# Patient Record
Sex: Female | Born: 1940
Health system: Southern US, Community
[De-identification: ages and names within clinical notes are randomized; demographics above are authoritative.]

## PROBLEM LIST (undated history)

## (undated) DIAGNOSIS — K219 Gastro-esophageal reflux disease without esophagitis: Secondary | ICD-10-CM

## (undated) DIAGNOSIS — R918 Other nonspecific abnormal finding of lung field: Secondary | ICD-10-CM

## (undated) DIAGNOSIS — J449 Chronic obstructive pulmonary disease, unspecified: Secondary | ICD-10-CM

## (undated) DIAGNOSIS — E785 Hyperlipidemia, unspecified: Secondary | ICD-10-CM

## (undated) DIAGNOSIS — F32A Depression, unspecified: Secondary | ICD-10-CM

## (undated) DIAGNOSIS — F419 Anxiety disorder, unspecified: Secondary | ICD-10-CM

## (undated) DIAGNOSIS — S7291XA Unspecified fracture of right femur, initial encounter for closed fracture: Secondary | ICD-10-CM

## (undated) HISTORY — DX: Anxiety disorder, unspecified: F41.9

## (undated) HISTORY — DX: Chronic obstructive pulmonary disease, unspecified: J44.9

## (undated) HISTORY — PX: COLONOSCOPY: SHX174

---

## 1973-12-18 HISTORY — PX: ABDOMINAL HYSTERECTOMY: SHX81

## 2006-09-20 ENCOUNTER — Ambulatory Visit: Payer: Self-pay | Admitting: Family Medicine

## 2007-01-15 ENCOUNTER — Ambulatory Visit: Payer: Self-pay

## 2007-04-23 ENCOUNTER — Ambulatory Visit: Payer: Self-pay | Admitting: Anesthesiology

## 2007-05-02 ENCOUNTER — Ambulatory Visit: Payer: Self-pay | Admitting: Anesthesiology

## 2007-05-22 ENCOUNTER — Ambulatory Visit: Payer: Self-pay | Admitting: Anesthesiology

## 2007-06-20 ENCOUNTER — Ambulatory Visit: Payer: Self-pay | Admitting: Anesthesiology

## 2007-07-24 ENCOUNTER — Ambulatory Visit: Payer: Self-pay | Admitting: Anesthesiology

## 2007-08-27 ENCOUNTER — Ambulatory Visit: Payer: Self-pay | Admitting: Anesthesiology

## 2007-09-24 ENCOUNTER — Ambulatory Visit: Payer: Self-pay | Admitting: Family Medicine

## 2007-09-27 ENCOUNTER — Ambulatory Visit: Payer: Self-pay | Admitting: Anesthesiology

## 2007-11-21 ENCOUNTER — Ambulatory Visit: Payer: Self-pay | Admitting: Orthopaedic Surgery

## 2008-01-02 ENCOUNTER — Ambulatory Visit: Payer: Self-pay | Admitting: Gastroenterology

## 2008-01-13 ENCOUNTER — Ambulatory Visit: Payer: Self-pay | Admitting: Pain Medicine

## 2008-01-22 ENCOUNTER — Ambulatory Visit: Payer: Self-pay | Admitting: Pain Medicine

## 2008-02-03 ENCOUNTER — Ambulatory Visit: Payer: Self-pay | Admitting: Pain Medicine

## 2008-02-13 ENCOUNTER — Ambulatory Visit: Payer: Self-pay | Admitting: Pain Medicine

## 2008-03-02 ENCOUNTER — Ambulatory Visit: Payer: Self-pay | Admitting: Pain Medicine

## 2008-03-18 ENCOUNTER — Other Ambulatory Visit: Payer: Self-pay

## 2008-03-18 ENCOUNTER — Ambulatory Visit: Payer: Self-pay | Admitting: Unknown Physician Specialty

## 2008-03-23 ENCOUNTER — Ambulatory Visit: Payer: Self-pay | Admitting: Unknown Physician Specialty

## 2008-04-07 ENCOUNTER — Encounter: Payer: Self-pay | Admitting: Unknown Physician Specialty

## 2008-04-17 ENCOUNTER — Encounter: Payer: Self-pay | Admitting: Unknown Physician Specialty

## 2008-05-18 ENCOUNTER — Encounter: Payer: Self-pay | Admitting: Unknown Physician Specialty

## 2008-07-18 ENCOUNTER — Ambulatory Visit: Payer: Self-pay | Admitting: Family Medicine

## 2008-07-20 ENCOUNTER — Ambulatory Visit: Payer: Self-pay | Admitting: Internal Medicine

## 2008-09-04 ENCOUNTER — Ambulatory Visit: Payer: Self-pay | Admitting: Unknown Physician Specialty

## 2008-12-08 ENCOUNTER — Ambulatory Visit: Payer: Self-pay | Admitting: Family Medicine

## 2008-12-12 ENCOUNTER — Ambulatory Visit: Payer: Self-pay | Admitting: Family Medicine

## 2008-12-17 ENCOUNTER — Ambulatory Visit: Payer: Self-pay | Admitting: Family Medicine

## 2008-12-18 ENCOUNTER — Ambulatory Visit: Payer: Self-pay | Admitting: Oncology

## 2008-12-21 ENCOUNTER — Ambulatory Visit: Payer: Self-pay | Admitting: Oncology

## 2009-01-18 ENCOUNTER — Ambulatory Visit: Payer: Self-pay | Admitting: Oncology

## 2010-04-14 ENCOUNTER — Ambulatory Visit: Payer: Self-pay | Admitting: Family Medicine

## 2010-12-18 HISTORY — PX: COLOSTOMY: SHX63

## 2010-12-18 HISTORY — PX: OTHER SURGICAL HISTORY: SHX169

## 2011-03-28 ENCOUNTER — Ambulatory Visit: Payer: Self-pay | Admitting: Family Medicine

## 2011-06-18 ENCOUNTER — Ambulatory Visit: Payer: Self-pay | Admitting: Internal Medicine

## 2011-06-28 ENCOUNTER — Ambulatory Visit: Payer: Self-pay | Admitting: Family Medicine

## 2011-06-30 ENCOUNTER — Inpatient Hospital Stay: Payer: Self-pay | Admitting: Surgery

## 2011-07-19 ENCOUNTER — Ambulatory Visit: Payer: Self-pay | Admitting: Internal Medicine

## 2011-08-13 ENCOUNTER — Emergency Department: Payer: Self-pay | Admitting: Emergency Medicine

## 2011-08-19 ENCOUNTER — Ambulatory Visit: Payer: Self-pay | Admitting: Internal Medicine

## 2011-08-30 ENCOUNTER — Ambulatory Visit: Payer: Self-pay | Admitting: Surgery

## 2011-11-27 ENCOUNTER — Ambulatory Visit: Payer: Self-pay | Admitting: Surgery

## 2011-12-19 HISTORY — PX: TAKE DOWN OF INTESTINAL FISTULA: SHX6107

## 2011-12-19 HISTORY — PX: TUMOR EXCISION: SHX421

## 2011-12-29 ENCOUNTER — Ambulatory Visit: Payer: Self-pay | Admitting: Surgery

## 2011-12-29 LAB — CBC WITH DIFFERENTIAL/PLATELET
Basophil #: 0 10*3/uL (ref 0.0–0.1)
Basophil %: 0.4 %
Eosinophil %: 1.7 %
HCT: 34 % — ABNORMAL LOW (ref 35.0–47.0)
HGB: 11.4 g/dL — ABNORMAL LOW (ref 12.0–16.0)
Lymphocyte #: 1.9 10*3/uL (ref 1.0–3.6)
MCV: 95 fL (ref 80–100)
Monocyte %: 5.3 %
Neutrophil %: 72.3 %
Platelet: 317 10*3/uL (ref 150–440)
RBC: 3.58 10*6/uL — ABNORMAL LOW (ref 3.80–5.20)
RDW: 14.8 % — ABNORMAL HIGH (ref 11.5–14.5)

## 2011-12-29 LAB — BASIC METABOLIC PANEL
Anion Gap: 9 (ref 7–16)
BUN: 16 mg/dL (ref 7–18)
Calcium, Total: 9.1 mg/dL (ref 8.5–10.1)
Co2: 30 mmol/L (ref 21–32)
Creatinine: 0.82 mg/dL (ref 0.60–1.30)
EGFR (African American): >60
EGFR (Non-African Amer.): >60
Osmolality: 287 (ref 275–301)
Potassium: 4 mmol/L (ref 3.5–5.1)

## 2012-01-05 ENCOUNTER — Inpatient Hospital Stay: Payer: Self-pay | Admitting: Surgery

## 2012-01-06 LAB — CBC WITH DIFFERENTIAL/PLATELET
Basophil #: 0 10*3/uL (ref 0.0–0.1)
Eosinophil #: 0 10*3/uL (ref 0.0–0.7)
HGB: 10 g/dL — ABNORMAL LOW (ref 12.0–16.0)
Lymphocyte %: 9.4 %
MCHC: 34 g/dL (ref 32.0–36.0)
Neutrophil %: 79.8 %
Platelet: 256 10*3/uL (ref 150–440)
RDW: 13.9 % (ref 11.5–14.5)

## 2012-01-06 LAB — BASIC METABOLIC PANEL
BUN: 8 mg/dL (ref 7–18)
Calcium, Total: 8.8 mg/dL (ref 8.5–10.1)
Chloride: 104 mmol/L (ref 98–107)
EGFR (African American): >60
Osmolality: 287 (ref 275–301)
Potassium: 4.9 mmol/L (ref 3.5–5.1)

## 2012-01-07 LAB — CBC WITH DIFFERENTIAL/PLATELET
Basophil #: 0 10*3/uL (ref 0.0–0.1)
Eosinophil %: 1.8 %
HGB: 9.6 g/dL — ABNORMAL LOW (ref 12.0–16.0)
Lymphocyte %: 22.9 %
MCHC: 33.8 g/dL (ref 32.0–36.0)
MCV: 96 fL (ref 80–100)
Monocyte #: 0.9 10*3/uL — ABNORMAL HIGH (ref 0.0–0.7)
Monocyte %: 9.4 %
Neutrophil #: 6.4 10*3/uL (ref 1.4–6.5)
Neutrophil %: 65.7 %
Platelet: 208 10*3/uL (ref 150–440)
RBC: 2.96 10*6/uL — ABNORMAL LOW (ref 3.80–5.20)
WBC: 9.8 10*3/uL (ref 3.6–11.0)

## 2012-01-07 LAB — BASIC METABOLIC PANEL
Anion Gap: 10 (ref 7–16)
Chloride: 103 mmol/L (ref 98–107)
Co2: 32 mmol/L (ref 21–32)
Creatinine: 0.56 mg/dL — ABNORMAL LOW (ref 0.60–1.30)
Osmolality: 284 (ref 275–301)
Potassium: 3.6 mmol/L (ref 3.5–5.1)
Sodium: 145 mmol/L (ref 136–145)

## 2012-01-17 ENCOUNTER — Ambulatory Visit: Payer: Self-pay | Admitting: Surgery

## 2012-01-17 LAB — BASIC METABOLIC PANEL
Calcium, Total: 8.7 mg/dL (ref 8.5–10.1)
Chloride: 99 mmol/L (ref 98–107)
Co2: 28 mmol/L (ref 21–32)
Osmolality: 274 (ref 275–301)
Sodium: 137 mmol/L (ref 136–145)

## 2012-01-17 LAB — CBC WITH DIFFERENTIAL/PLATELET
Basophil #: 0 10*3/uL (ref 0.0–0.1)
Basophil %: 0.1 %
Eosinophil #: 0.1 10*3/uL (ref 0.0–0.7)
Eosinophil %: 0.5 %
HCT: 29.8 % — ABNORMAL LOW (ref 35.0–47.0)
Lymphocyte #: 1.2 10*3/uL (ref 1.0–3.6)
MCH: 33.5 pg (ref 26.0–34.0)
MCV: 98 fL (ref 80–100)
Monocyte #: 1.5 10*3/uL — ABNORMAL HIGH (ref 0.0–0.7)
Monocyte %: 7.6 %
Neutrophil #: 17.5 10*3/uL — ABNORMAL HIGH (ref 1.4–6.5)
Platelet: 483 10*3/uL — ABNORMAL HIGH (ref 150–440)
RDW: 13.1 % (ref 11.5–14.5)
WBC: 20.3 10*3/uL — ABNORMAL HIGH (ref 3.6–11.0)

## 2012-01-18 ENCOUNTER — Inpatient Hospital Stay: Payer: Self-pay | Admitting: Surgery

## 2012-01-19 LAB — HEPATIC FUNCTION PANEL A (ARMC)
Albumin: 2.4 g/dL — ABNORMAL LOW (ref 3.4–5.0)
Alkaline Phosphatase: 279 U/L — ABNORMAL HIGH (ref 50–136)
Bilirubin, Direct: 0.2 mg/dL (ref 0.00–0.20)
Bilirubin,Total: 0.5 mg/dL (ref 0.2–1.0)

## 2012-01-19 LAB — BASIC METABOLIC PANEL
BUN: 11 mg/dL (ref 7–18)
Calcium, Total: 8.5 mg/dL (ref 8.5–10.1)
Co2: 28 mmol/L (ref 21–32)
Creatinine: 0.89 mg/dL (ref 0.60–1.30)
EGFR (Non-African Amer.): >60
Potassium: 4.6 mmol/L (ref 3.5–5.1)
Sodium: 140 mmol/L (ref 136–145)

## 2012-01-19 LAB — CBC WITH DIFFERENTIAL/PLATELET
Basophil #: 0 10*3/uL (ref 0.0–0.1)
Eosinophil #: 0.2 10*3/uL (ref 0.0–0.7)
HCT: 23.9 % — ABNORMAL LOW (ref 35.0–47.0)
Lymphocyte #: 1.4 10*3/uL (ref 1.0–3.6)
MCHC: 33.9 g/dL (ref 32.0–36.0)
MCV: 97 fL (ref 80–100)
Monocyte #: 1.7 10*3/uL — ABNORMAL HIGH (ref 0.0–0.7)
Neutrophil #: 15.5 10*3/uL — ABNORMAL HIGH (ref 1.4–6.5)
Neutrophil %: 82.2 %
RBC: 2.46 10*6/uL — ABNORMAL LOW (ref 3.80–5.20)
RDW: 13.6 % (ref 11.5–14.5)

## 2012-01-19 LAB — PROTIME-INR: Prothrombin Time: 15.3 secs — ABNORMAL HIGH (ref 11.5–14.7)

## 2012-01-19 LAB — APTT
Activated PTT: 48.1 secs — ABNORMAL HIGH (ref 23.6–35.9)
Activated PTT: 43 secs — ABNORMAL HIGH (ref 23.6–35.9)

## 2012-01-20 LAB — URINALYSIS, COMPLETE
Bacteria: NONE SEEN
Glucose,UR: NEGATIVE mg/dL (ref 0–75)
Leukocyte Esterase: NEGATIVE
Nitrite: NEGATIVE
Protein: NEGATIVE
RBC,UR: NONE SEEN /HPF (ref 0–5)
WBC UR: 7 /HPF (ref 0–5)

## 2012-01-20 LAB — CLOSTRIDIUM DIFFICILE BY PCR

## 2012-01-21 LAB — CBC WITH DIFFERENTIAL/PLATELET
Basophil #: 0 10*3/uL (ref 0.0–0.1)
Basophil %: 0.2 %
Eosinophil #: 0.3 10*3/uL (ref 0.0–0.7)
Eosinophil %: 3.3 %
HCT: 23.7 % — ABNORMAL LOW (ref 35.0–47.0)
HGB: 7.9 g/dL — ABNORMAL LOW (ref 12.0–16.0)
Lymphocyte #: 1.7 10*3/uL (ref 1.0–3.6)
Lymphocyte %: 17 %
MCH: 32.4 pg (ref 26.0–34.0)
MCHC: 33.4 g/dL (ref 32.0–36.0)
MCV: 97 fL (ref 80–100)
Neutrophil #: 7.5 10*3/uL — ABNORMAL HIGH (ref 1.4–6.5)
RDW: 13.2 % (ref 11.5–14.5)

## 2012-01-21 LAB — BASIC METABOLIC PANEL
Anion Gap: 10 (ref 7–16)
BUN: 5 mg/dL — ABNORMAL LOW (ref 7–18)
Calcium, Total: 8.7 mg/dL (ref 8.5–10.1)
Co2: 21 mmol/L (ref 21–32)
Creatinine: 0.91 mg/dL (ref 0.60–1.30)
EGFR (African American): >60
EGFR (Non-African Amer.): >60

## 2012-01-22 LAB — CBC WITH DIFFERENTIAL/PLATELET
Basophil %: 0.4 %
Eosinophil %: 5.3 %
HCT: 22.2 % — ABNORMAL LOW (ref 35.0–47.0)
HGB: 7.4 g/dL — ABNORMAL LOW (ref 12.0–16.0)
Lymphocyte #: 1.5 10*3/uL (ref 1.0–3.6)
Lymphocyte %: 24.3 %
MCH: 32.3 pg (ref 26.0–34.0)
MCHC: 33.5 g/dL (ref 32.0–36.0)
Monocyte %: 9.4 %
Neutrophil #: 3.8 10*3/uL (ref 1.4–6.5)
Neutrophil %: 60.6 %
Platelet: 492 10*3/uL — ABNORMAL HIGH (ref 150–440)
RDW: 13.5 % (ref 11.5–14.5)

## 2012-01-22 LAB — BASIC METABOLIC PANEL WITH GFR
Anion Gap: 11
BUN: 4 mg/dL — ABNORMAL LOW
Calcium, Total: 8.6 mg/dL
Chloride: 106 mmol/L
Co2: 25 mmol/L
Creatinine: 0.87 mg/dL
EGFR (African American): >60
EGFR (Non-African Amer.): >60
Glucose: 87 mg/dL
Osmolality: 279
Potassium: 3.9 mmol/L
Sodium: 142 mmol/L

## 2012-01-23 LAB — CBC WITH DIFFERENTIAL/PLATELET
Basophil %: 0.2 %
Eosinophil #: 0.3 10*3/uL (ref 0.0–0.7)
Eosinophil %: 3.2 %
Lymphocyte #: 1.3 10*3/uL (ref 1.0–3.6)
MCH: 32.4 pg (ref 26.0–34.0)
MCHC: 33.5 g/dL (ref 32.0–36.0)
MCV: 97 fL (ref 80–100)
Monocyte #: 0.7 10*3/uL (ref 0.0–0.7)
Neutrophil #: 6.8 10*3/uL — ABNORMAL HIGH (ref 1.4–6.5)
Platelet: 577 10*3/uL — ABNORMAL HIGH (ref 150–440)
RBC: 2.6 10*6/uL — ABNORMAL LOW (ref 3.80–5.20)
RDW: 13.6 % (ref 11.5–14.5)

## 2012-01-23 LAB — PROTIME-INR
INR: 1.1
Prothrombin Time: 14.4 secs (ref 11.5–14.7)

## 2012-01-24 LAB — CBC WITH DIFFERENTIAL/PLATELET
Basophil %: 0.3 %
Eosinophil #: 0.3 10*3/uL (ref 0.0–0.7)
Eosinophil %: 3.1 %
Lymphocyte #: 1.8 10*3/uL (ref 1.0–3.6)
Monocyte #: 0.8 10*3/uL — ABNORMAL HIGH (ref 0.0–0.7)
Monocyte %: 7.9 %
Neutrophil #: 7 10*3/uL — ABNORMAL HIGH (ref 1.4–6.5)
Neutrophil %: 70.6 %
Platelet: 609 10*3/uL — ABNORMAL HIGH (ref 150–440)
RDW: 14 % (ref 11.5–14.5)
WBC: 9.9 10*3/uL (ref 3.6–11.0)

## 2012-01-24 LAB — COMPREHENSIVE METABOLIC PANEL
Alkaline Phosphatase: 175 U/L — ABNORMAL HIGH (ref 50–136)
BUN: 6 mg/dL — ABNORMAL LOW (ref 7–18)
Bilirubin,Total: 0.2 mg/dL (ref 0.2–1.0)
Calcium, Total: 8.8 mg/dL (ref 8.5–10.1)
Co2: 30 mmol/L (ref 21–32)
Creatinine: 0.91 mg/dL (ref 0.60–1.30)
EGFR (African American): >60
EGFR (Non-African Amer.): >60
Osmolality: 274 (ref 275–301)
Sodium: 139 mmol/L (ref 136–145)

## 2012-01-24 LAB — BODY FLUID CULTURE

## 2012-01-24 LAB — PROTIME-INR: Prothrombin Time: 13.9 secs (ref 11.5–14.7)

## 2012-02-07 ENCOUNTER — Ambulatory Visit: Payer: Self-pay | Admitting: Surgery

## 2012-03-13 ENCOUNTER — Ambulatory Visit: Payer: Self-pay | Admitting: Surgery

## 2012-03-13 LAB — CBC WITH DIFFERENTIAL/PLATELET
Basophil %: 0.2 %
Eosinophil #: 0.4 10*3/uL (ref 0.0–0.7)
Eosinophil %: 3.3 %
HCT: 34.8 % — ABNORMAL LOW (ref 35.0–47.0)
HGB: 11.4 g/dL — ABNORMAL LOW (ref 12.0–16.0)
Lymphocyte %: 11.4 %
MCHC: 32.9 g/dL (ref 32.0–36.0)
Monocyte %: 4.8 %
Neutrophil #: 9.1 10*3/uL — ABNORMAL HIGH (ref 1.4–6.5)
Platelet: 424 10*3/uL (ref 150–440)
RDW: 11.8 % (ref 11.5–14.5)
WBC: 11.3 10*3/uL — ABNORMAL HIGH (ref 3.6–11.0)

## 2012-05-31 IMAGING — CT CT GUIDED ABCESS DRAINAGE WITH CATHETER
3 of 4 series · 7 of 16 positions shown, 8 images · non-contrast
Comparison: none

REASON FOR EXAM: Pelvis
COMMENTS:

PROCEDURE:     CT  - CT GUIDED ABSCESS DRAINAGE  - January 19, 2012  [DATE]
RESULT:     History: Abscess.

[Series 2: soft tissue · axial · 0.90mm/px · z∈[-392,-314]mm · 3 of 53 slices shown, 4 images (1 of 3)]
[im 14/53  soft-tissue]
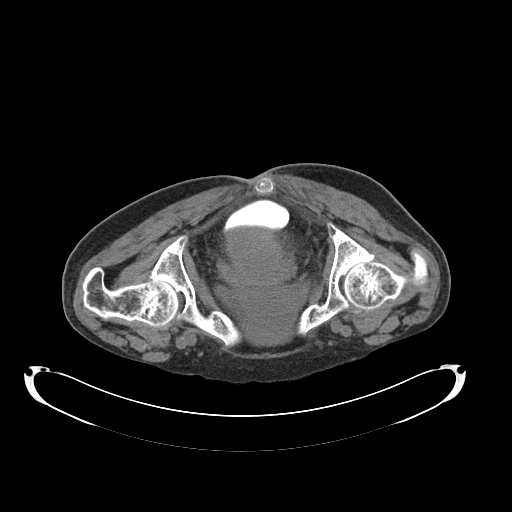
[im 14/53  bone]
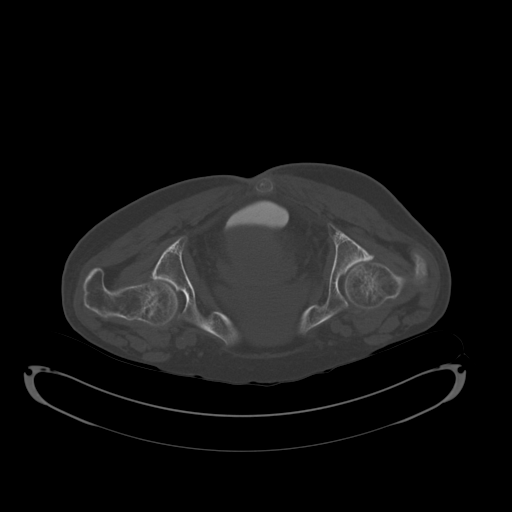
[im 27/53  soft-tissue]
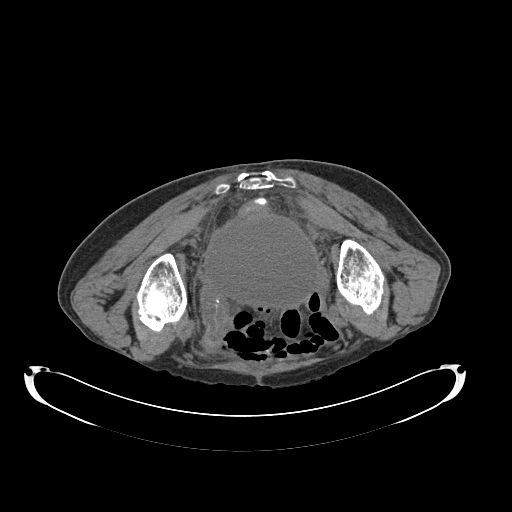
[im 40/53  soft-tissue]
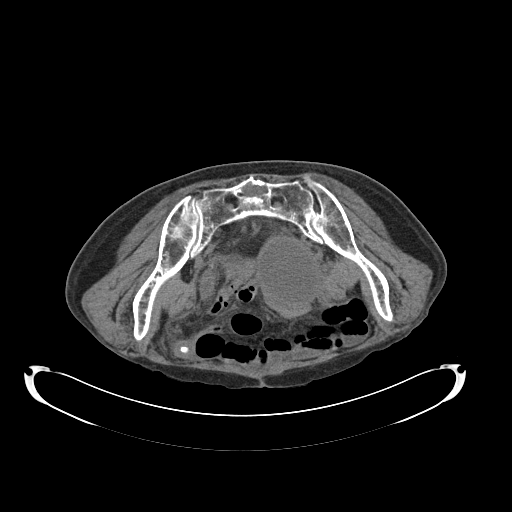

[Series 6: soft tissue · axial · 0.90mm/px · z∈[-386,-341]mm · 2 of 47 slices shown (2 of 3)]
[im 16/47  soft-tissue]
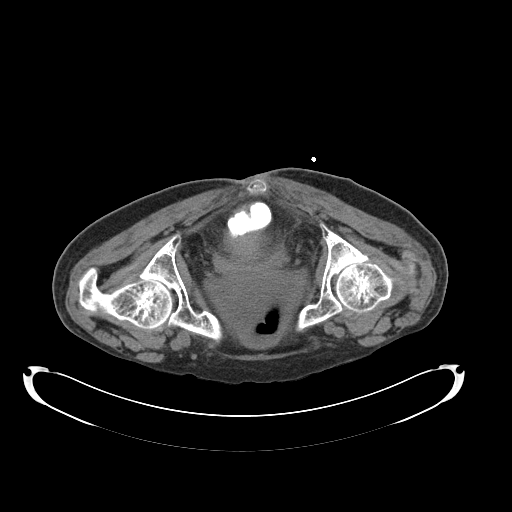
[im 31/47  soft-tissue]
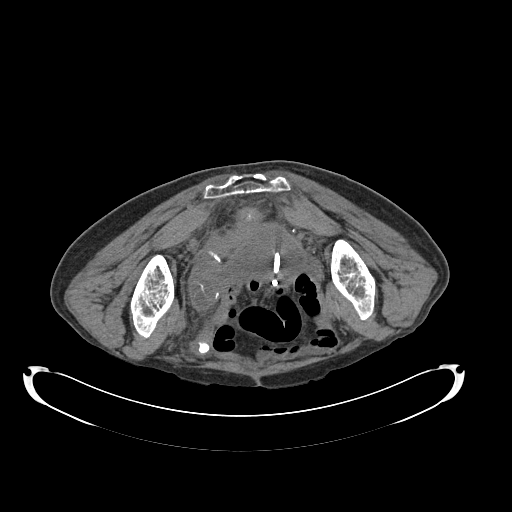

[Series 9: soft tissue · axial · 0.90mm/px · z∈[-386,-341]mm · 2 of 47 slices shown (3 of 3)]
[im 16/47  soft-tissue]
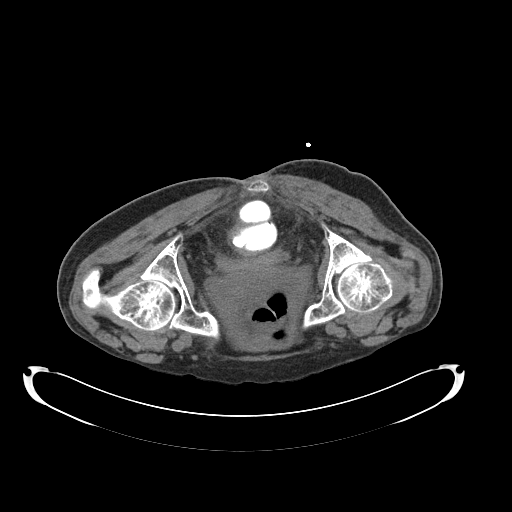
[im 31/47  soft-tissue]
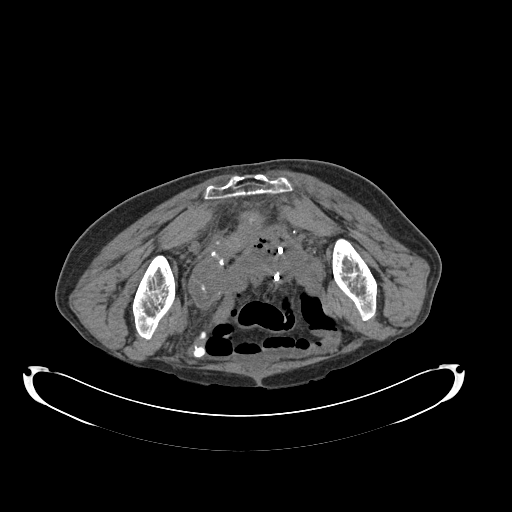

[7 of 16 positions shown; findings below may reference images not displayed]

FINDINGS: After discussing the risk and benefits of this procedure with the
patient , consent was obtained. Pelvis was sterilely prepped and draped.
Following local anesthesia with 1% lidocaine, a 22-gauge needle was advanced
into the pelvic fluid collection and and a 018 wire placed. This was
followed by placement of a 6.5 French introduction system. This was followed
with an Amplatz extra-stiff wire and serial dilatation to 10 French with
placement of a 10 Cayden Einstein drainage catheter. Approximately 300 cc
of pus removed. No complications.
IMPRESSION: Successful CT directed pelvic abscess drainage.

## 2012-06-21 ENCOUNTER — Ambulatory Visit: Payer: Self-pay | Admitting: Gastroenterology

## 2012-07-16 ENCOUNTER — Ambulatory Visit: Payer: Self-pay | Admitting: Urology

## 2012-08-09 ENCOUNTER — Ambulatory Visit: Payer: Self-pay | Admitting: Surgery

## 2012-08-09 LAB — CREATININE, SERUM
Creatinine: 0.93 mg/dL (ref 0.60–1.30)
EGFR (African American): >60
EGFR (Non-African Amer.): >60

## 2012-08-26 ENCOUNTER — Ambulatory Visit: Payer: Self-pay | Admitting: Gastroenterology

## 2012-08-28 LAB — PATHOLOGY REPORT

## 2012-10-16 DIAGNOSIS — K439 Ventral hernia without obstruction or gangrene: Secondary | ICD-10-CM | POA: Insufficient documentation

## 2012-10-16 DIAGNOSIS — K279 Peptic ulcer, site unspecified, unspecified as acute or chronic, without hemorrhage or perforation: Secondary | ICD-10-CM | POA: Insufficient documentation

## 2012-10-16 DIAGNOSIS — G629 Polyneuropathy, unspecified: Secondary | ICD-10-CM | POA: Insufficient documentation

## 2012-10-16 DIAGNOSIS — N823 Fistula of vagina to large intestine: Secondary | ICD-10-CM | POA: Insufficient documentation

## 2012-10-16 DIAGNOSIS — F419 Anxiety disorder, unspecified: Secondary | ICD-10-CM | POA: Insufficient documentation

## 2012-10-16 DIAGNOSIS — K631 Perforation of intestine (nontraumatic): Secondary | ICD-10-CM | POA: Insufficient documentation

## 2012-10-18 DIAGNOSIS — Z8719 Personal history of other diseases of the digestive system: Secondary | ICD-10-CM | POA: Insufficient documentation

## 2012-11-25 ENCOUNTER — Ambulatory Visit: Payer: Self-pay | Admitting: Gastroenterology

## 2012-11-26 LAB — PATHOLOGY REPORT

## 2014-01-12 ENCOUNTER — Ambulatory Visit: Payer: Self-pay | Admitting: Family Medicine

## 2015-04-11 NOTE — Discharge Summary (Signed)
PATIENT NAME:  Alexandra [name], CORDONE MR#:  [id number] DATE OF BIRTH:  [birth year]  DATE OF ADMISSION:  01/05/2012 DATE OF DISCHARGE:  01/10/2012  BRIEF HISTORY: Joeleen Wortley is a 74 year old woman who underwent colonic resection for perforated sigmoid colectomy in July. She has recovered uneventfully after a prolonged hospitalization. She returns for an elective colostomy reversal. After appropriate preparation and informed consent including mechanical antibiotic bowel prep, she was taken to surgery on the morning of 01/05/2012. She underwent an open colostomy closure. The procedure was uncomplicated. She had no significant intraoperative problems. She had some slow return of bowel function postoperatively but has been up, active, tolerating a diet with no complaints at this point. She is tolerating regular food. She's had multiple stools. She is passing gas normally. Her wounds look good without evidence of any infection. She will be discharged home today to be followed in the office in 7 to 10 days' time. Bathing, activity, and driving instructions were given to the patient.   DISCHARGE MEDICATIONS: She is to resume her home medications which include:  1. Meloxicam 7.5 mg b.i.d.  2. Simvastatin 40 mg p.o. daily.  3. Ambien 10 mg p.o. at bedtime. 4. Alprazolam 0.5 mg b.i.d. p.r.n.  5. Amitriptyline 25 mg p.o. at bedtime. 6. Norco 150/7.5 mg q.6 hours p.r.n.  7. Gabapentin 300 mg t.i.d.  8. Colace 100 mg p.o. b.i.d.  9. Aspirin 81 mg p.o. b.i.d.  10. Multiple vitamins. 11. Fish Oil.   FINAL DISCHARGE DIAGNOSIS: Perforated colon.       SURGERY: Colostomy reversal.  ____________________________ Micheline Maze, MD rle:drc D: 01/10/2012 07:43:27 ET T: 01/10/2012 12:48:45 ET JOB#: [id number]  cc: Rodena Goldmann III, MD, <Dictator> Valetta Close, MD Rodena Goldmann MD ELECTRONICALLY SIGNED 01/11/2012 8:03

## 2015-04-11 NOTE — Op Note (Signed)
PATIENT NAME:  Alexandra [name], Alexandra [name] MR#:  [id number] DATE OF BIRTH:  04-Jul-1941  DATE OF PROCEDURE:  01/05/2012  PREOPERATIVE DIAGNOSIS: Perforated colon with resection and colostomy.   POSTOPERATIVE DIAGNOSIS: Perforated colon with resection and colostomy.   OPERATION: Colostomy closure.   ANESTHESIA: General.   SURGEON: Rodena Goldmann, III, MD   OPERATIVE PROCEDURE: With the patient in the supine position after induction of appropriate general anesthesia, the patient's abdomen was prepped with ChloraPrep and draped with sterile towels. An alcohol wipe and Betadine impregnated Steri-Drape were utilized. The ostomy had previously been oversewn and the ostomy was ellipsed, incision carried down through the subcutaneous tissue with Bovie electrocautery. The ostomy was identified as it attached to the fascia and the fascial attachments were removed without difficulty. The end of the ostomy was stapled using a GIA-55 stapling device and the skin and ostomy site removed. The bowel was then placed back into the abdominal cavity. Palpation revealed dense adhesions in the abdomen so no attempt at laparoscopy was performed. Midline incision was reopened and carried down through the subcutaneous tissue with Bovie electrocautery. The area was carefully evaluated and significant adhesions were encountered in every quadrant. Tedious dissection was required to identify several loops of bowel that were attached to the colon and then the colon was dissected back to the cecum. After extensive dissection of the pelvis, the rectal and sigmoid remnants were identified. The rectum was then clamped with a purse-string clamp and a 2-0 nylon purse-string suture placed through the clamp. The bowel was opened. An EEA 25 stapling device was brought to the table and the anvil detached. The anvil was inserted into the distal bowel and secured with a purse-string suture. The proximal bowel was prepared for the anastomosis and a colotomy made  longitudinally in the proximal bowel. The body of the stapler was inserted through the lumen of the bowel and then the spike was driven out through the staple line distal segment of that piece of bowel. The stapler and anvil were married and the stapler approximated and fired. The rings were intact. Palpation of the anastomosis from inside did not demonstrate any leaks or deficits. The colotomy was closed transversely using two applications of UR-42 stapling device carrying a blue load. The area had some appendices epiploica and some extra omental fat which was sewn over the colotomy using 3-0 Vicryl. The anastomosis was reinforced with seromuscular sutures of 3-0 silk. The area was copiously irrigated. All bowel was inspected and returned to their anatomic position. Midline fascia was closed with running suture of 0 doubled PDS suture tying in the middle. Skin was stapled. The ostomy site was closed with interrupted figure-of-eight suture of 0 Maxon and skin clips. Sterile dressings were applied. The patient was returned to the recovery room having tolerated the procedure well. Sponge, instrument, and needle counts were correct x2 in the operating room.   ____________________________ Rodena Goldmann III, MD rle:drc D: 01/05/2012 10:39:58 ET T: 01/05/2012 10:56:12 ET JOB#: [id number]  cc: Micheline Maze, MD, <Dictator> Valetta Close, MD Rodena Goldmann MD ELECTRONICALLY SIGNED 01/06/2012 7:23

## 2015-04-11 NOTE — Discharge Summary (Signed)
PATIENT NAME:  Alexandra [name], Alexandra [name] MR#:  [id number] DATE OF BIRTH:  1941-11-30  DATE OF ADMISSION:  01/18/2012 DATE OF DISCHARGE:  01/25/2012  BRIEF HISTORY:  Mrs. Darlena Koval is a 74 year old woman admitted to the hospital with pelvic abscess. She has a complicated medical history dating back to July 2012. She suffered a perforated colon secondary to severe constipation and ischemic change in her colon. She underwent urgent resection and Hartman's pouch procedure. She was intubated and maintained in the Intensive Care Unit for several weeks, requiring tracheostomy. She eventually recovered and was transferred to a rehab facility and then transferred home. In January she underwent an ostomy reversal procedure performed without difficulty. She had no significant intraoperative complications. At about 10 days postop she began to develop some mild fever, abdominal pain, and malaise. She presented to the office where her white blood cell count was noted to be nearly 20,000 with a normal clinical examination. We recommended performance of a CT scan of the abdomen which did demonstrate a large pelvic abscess without evidence of concomitant leak. It was felt that she likely either had contamination at the time of original surgery or at leak at the time of her original ostomy reversal that was responsible for her pelvic abscess. She was admitted to the hospital, placed on IV antibiotics and rehydration. She was taken to the Special Procedures suite the following day where she underwent percutaneous drainage of this large abscess. She did well initially. She had approximately 350 mL withdrawn. Follow-up CT scan performed on 01/22/2012 demonstrated an improvement in the abscess collection, but still a fairly large collection of fluid. She was taken back to the angiographic suite and the catheter repositioned and a large amount of fluid removed. She began to improve very quickly at that point, developing a better appetite and  better bowel function. She had no further fever. She was discharged home on 01/25/2012 and will follow up in the office in 7 to 10 days' time. Bathing activity, and driving instructions were given to the patient. She is to resume her home medications and take Keflex 500 mg p.o. t.i.d. and Cleocin 500 mg p.o. t.i.d.   FINAL DISCHARGE DIAGNOSIS: Pelvic abscess.   SURGERY: Percutaneous abscess drainage.  ____________________________ Rodena Goldmann III, MD rle:bjt D:  02/03/2012 15:34:35 ET          T: 02/04/2012 10:44:57 ET         JOB#: [id number] Rodena Goldmann MD ELECTRONICALLY SIGNED 02/05/2012 8:28

## 2016-01-26 DIAGNOSIS — G894 Chronic pain syndrome: Secondary | ICD-10-CM | POA: Diagnosis not present

## 2016-01-26 DIAGNOSIS — F411 Generalized anxiety disorder: Secondary | ICD-10-CM | POA: Diagnosis not present

## 2016-01-26 DIAGNOSIS — F5101 Primary insomnia: Secondary | ICD-10-CM | POA: Diagnosis not present

## 2016-02-09 DIAGNOSIS — H401132 Primary open-angle glaucoma, bilateral, moderate stage: Secondary | ICD-10-CM | POA: Diagnosis not present

## 2016-02-16 DIAGNOSIS — H401131 Primary open-angle glaucoma, bilateral, mild stage: Secondary | ICD-10-CM | POA: Diagnosis not present

## 2016-05-12 DIAGNOSIS — F5101 Primary insomnia: Secondary | ICD-10-CM | POA: Diagnosis not present

## 2016-05-12 DIAGNOSIS — Z6825 Body mass index (BMI) 25.0-25.9, adult: Secondary | ICD-10-CM | POA: Diagnosis not present

## 2016-05-12 DIAGNOSIS — F411 Generalized anxiety disorder: Secondary | ICD-10-CM | POA: Diagnosis not present

## 2016-05-12 DIAGNOSIS — G894 Chronic pain syndrome: Secondary | ICD-10-CM | POA: Diagnosis not present

## 2016-05-12 DIAGNOSIS — E611 Iron deficiency: Secondary | ICD-10-CM | POA: Diagnosis not present

## 2016-08-09 DIAGNOSIS — N3000 Acute cystitis without hematuria: Secondary | ICD-10-CM | POA: Diagnosis not present

## 2016-08-09 DIAGNOSIS — F5101 Primary insomnia: Secondary | ICD-10-CM | POA: Diagnosis not present

## 2016-08-09 DIAGNOSIS — G894 Chronic pain syndrome: Secondary | ICD-10-CM | POA: Diagnosis not present

## 2016-08-09 DIAGNOSIS — Z6824 Body mass index (BMI) 24.0-24.9, adult: Secondary | ICD-10-CM | POA: Diagnosis not present

## 2016-08-16 DIAGNOSIS — H401131 Primary open-angle glaucoma, bilateral, mild stage: Secondary | ICD-10-CM | POA: Diagnosis not present

## 2016-08-22 DIAGNOSIS — D519 Vitamin B12 deficiency anemia, unspecified: Secondary | ICD-10-CM | POA: Diagnosis not present

## 2016-08-22 DIAGNOSIS — Z23 Encounter for immunization: Secondary | ICD-10-CM | POA: Diagnosis not present

## 2016-09-29 DIAGNOSIS — H1132 Conjunctival hemorrhage, left eye: Secondary | ICD-10-CM | POA: Diagnosis not present

## 2016-11-15 DIAGNOSIS — Z6824 Body mass index (BMI) 24.0-24.9, adult: Secondary | ICD-10-CM | POA: Diagnosis not present

## 2016-11-15 DIAGNOSIS — G894 Chronic pain syndrome: Secondary | ICD-10-CM | POA: Diagnosis not present

## 2016-11-15 DIAGNOSIS — F5101 Primary insomnia: Secondary | ICD-10-CM | POA: Diagnosis not present

## 2016-11-15 DIAGNOSIS — F411 Generalized anxiety disorder: Secondary | ICD-10-CM | POA: Diagnosis not present

## 2017-01-08 DIAGNOSIS — F411 Generalized anxiety disorder: Secondary | ICD-10-CM | POA: Diagnosis not present

## 2017-01-08 DIAGNOSIS — G894 Chronic pain syndrome: Secondary | ICD-10-CM | POA: Diagnosis not present

## 2017-01-08 DIAGNOSIS — F5101 Primary insomnia: Secondary | ICD-10-CM | POA: Diagnosis not present

## 2017-01-08 DIAGNOSIS — Z6824 Body mass index (BMI) 24.0-24.9, adult: Secondary | ICD-10-CM | POA: Diagnosis not present

## 2017-01-26 DIAGNOSIS — G609 Hereditary and idiopathic neuropathy, unspecified: Secondary | ICD-10-CM | POA: Diagnosis not present

## 2017-01-26 DIAGNOSIS — M5136 Other intervertebral disc degeneration, lumbar region: Secondary | ICD-10-CM | POA: Diagnosis not present

## 2017-01-30 DIAGNOSIS — G609 Hereditary and idiopathic neuropathy, unspecified: Secondary | ICD-10-CM | POA: Diagnosis not present

## 2017-02-06 ENCOUNTER — Ambulatory Visit: Payer: PPO | Attending: Anesthesiology | Admitting: Anesthesiology

## 2017-02-06 ENCOUNTER — Encounter: Payer: Self-pay | Admitting: Anesthesiology

## 2017-02-06 VITALS — BP 153/107 | HR 105 | Temp 98.0°F | Resp 16 | Ht 69.0 in | Wt 165.0 lb

## 2017-02-06 DIAGNOSIS — Z933 Colostomy status: Secondary | ICD-10-CM | POA: Diagnosis not present

## 2017-02-06 DIAGNOSIS — G8929 Other chronic pain: Secondary | ICD-10-CM | POA: Diagnosis not present

## 2017-02-06 DIAGNOSIS — M5136 Other intervertebral disc degeneration, lumbar region: Secondary | ICD-10-CM | POA: Insufficient documentation

## 2017-02-06 DIAGNOSIS — M4697 Unspecified inflammatory spondylopathy, lumbosacral region: Secondary | ICD-10-CM | POA: Diagnosis not present

## 2017-02-06 DIAGNOSIS — M5441 Lumbago with sciatica, right side: Secondary | ICD-10-CM | POA: Diagnosis not present

## 2017-02-06 DIAGNOSIS — Z87891 Personal history of nicotine dependence: Secondary | ICD-10-CM | POA: Insufficient documentation

## 2017-02-06 DIAGNOSIS — Z90710 Acquired absence of both cervix and uterus: Secondary | ICD-10-CM | POA: Diagnosis not present

## 2017-02-06 DIAGNOSIS — M5442 Lumbago with sciatica, left side: Secondary | ICD-10-CM | POA: Insufficient documentation

## 2017-02-06 DIAGNOSIS — M47817 Spondylosis without myelopathy or radiculopathy, lumbosacral region: Secondary | ICD-10-CM

## 2017-02-06 DIAGNOSIS — G609 Hereditary and idiopathic neuropathy, unspecified: Secondary | ICD-10-CM | POA: Diagnosis present

## 2017-02-06 DIAGNOSIS — Z8249 Family history of ischemic heart disease and other diseases of the circulatory system: Secondary | ICD-10-CM | POA: Insufficient documentation

## 2017-02-06 NOTE — Progress Notes (Signed)
Safety precautions to be maintained throughout the outpatient stay will include: orient to surroundings, keep bed in low position, maintain call bell within reach at all times, provide assistance with transfer out of bed and ambulation.  

## 2017-02-06 NOTE — Progress Notes (Signed)
Subjective:  Patient ID: Alexandra [name], female    DOB: 14-Jun-1941  Age: 76 y.o. MRN: [id number]  CC: Tailbone Pain and Back Pain     PROCEDURE:None  HPI Alexandra [name] presents for a new patient evaluation. She is a pleasant 76 year old white female with long-standing history of low back pain. She has had previous back surgery as well. She is referred by Dr. Clemmie Krill for evaluation and management. She presently describes a low back pain that radiates from the low back and tailbone down the posterior aspect of her legs. She has had previous injection therapy for her pain and this was ineffective. She describes some epidural injections and refuses to proceed with repeat injection at this time. She has gone through conservative management and required additional assistance with opioid management. The pain she describes is a shooting stabbing type pain with occasional burning cramping characteristic that radiates down the backs of the legs. Her maximum pain score is a 10 and worse with sitting standing but better with rest sleep hot and cold management. Her medications including Percocet 10 mg up to 4 times a day have been beneficial. Furthermore she takes Xanax to help with sleep at night on a when necessary basis and Ambien 10 mg tablets to help with sleep as well. She is on gabapentin 600 mg 4 times a day as well. No new weakness to the lower extremities or problems with bowel or bladder function is noted  History Alexandra [name] has a past medical history of COPD (chronic obstructive pulmonary disease) ([place]).   She has a past surgical history that includes Abdominal hysterectomy (1975); intestinal rupture (2012); Colostomy (2012); Tumor excision (2013); and Take down of intestinal fistula (2013).   Her family history includes Heart disease in her father and mother.She reports that she has quit smoking. She has never used smokeless tobacco. Her alcohol and drug histories are not on file.  Results for orders  placed in visit on 11/21/07  MR Cleone W/O Cm   Narrative * PRIOR REPORT IMPORTED FROM AN EXTERNAL SYSTEM *   PRIOR REPORT IMPORTED FROM THE [facility] [place] EXAM:    low back pain and spinalstonsis  COMMENTS:   PROCEDURE:     MR  - MR LUMBAR SPINE WO CONTRAST  - Nov 21 2007  5:39PM   RESULT:     Comparison: Lumbar spine radiographs on 12/28/2006 from the  [place] [facility]. Lumbar MRI 01/15/2007.   Procedure: Multiplanar, multisequence MR examination of the lumbar spine  was  performed without gadolinium contrast.   Findings:   The lumbar spine alignment is within normal limits. The conus terminates  at  the L1 level and is unremarkable. There is no significant bone marrow  abnormality to suggest an acute fracture or a suspicious osseous lesion.   L1-L2: There is disc desiccation with mild to moderate disc height loss.  There is mild right paracentral disc protrusion. There is no significant  canal or neural foraminal narrowing.   L2-L3: There is disc desiccation with mild-to-moderate disc height loss.  There is diffuse disc bulge. There is mild to moderate canal narrowing.  There is no significant neural foraminal narrowing.   L3-L4: There is disc desiccation with mild to moderate is high loss. There  is mild diffuse disc bulge. There is mild canal narrowing. There is no  significant neural foraminal narrowing.   L4-L5: There is disc desiccation with severe disc height loss. There is  mild  diffuse disc osteophyte complex with left posterolateral disc protrusion.  There is no significant canal narrowing. There is moderate left neural  foraminal narrowing.   L5-S1: There is mild disc desiccation with mild to moderate disc height  loss. There is no significant disc herniation. There is no significant  canal  narrowing. There is mild right neural foraminal narrowing.   Dural ectasia vs Tarlov cysts of the sacrum is again partially visualized,   similar compare to the previous exam.   IMPRESSION:   1. Multilevel degenerative changes of the lumbar spine are as detailed  above  2. Dural ectasia vs Tarlov cysts of the sacrum is again partially  visualized, similar compare to the previous exam.   Thank you for this opportunity to contribute to the care of your patient.        No results found for: TOXASSSELUR  No outpatient prescriptions prior to visit.   No facility-administered medications prior to visit.    Lab Results  Component Value Date   WBC 11.3 (H) 03/13/2012   HGB 11.4 (L) 03/13/2012   HCT 34.8 (L) 03/13/2012   PLT 424 03/13/2012   GLUCOSE 82 01/24/2012   ALT 14 01/24/2012   AST 10 (L) 01/24/2012   NA 139 01/24/2012   K 4.2 01/24/2012   CL 103 01/24/2012   CREATININE 0.93 08/09/2012   BUN 6 (L) 01/24/2012   CO2 30 01/24/2012   INR 1.0 01/24/2012    --------------------------------------------------------------------------------------------------------------------- Dg Chest 2 View  Result Date: 01/12/2014 * PRIOR REPORT IMPORTED FROM AN EXTERNAL SYSTEM * CLINICAL DATA:  Fever EXAM: CHEST  2 VIEW COMPARISON:  12/29/2011 FINDINGS: The cardiac shadow is stable. The lungs are clear bilaterally. Minimal residual blunting of the right costophrenic angle is again seen. No acute bony abnormality is noted. IMPRESSION: No acute abnormality seen. Electronically Signed   By: Inez Catalina M.D.   On: 01/12/2014 12:40        ---------------------------------------------------------------------------------------------------------------------- Past Medical History:  Diagnosis Date  . COPD (chronic obstructive pulmonary disease) ([place])     Past Surgical History:  Procedure Laterality Date  . ABDOMINAL HYSTERECTOMY  1975  . COLOSTOMY  2012   repaired and reduced in 2013  . intestinal rupture  2012  . TAKE DOWN OF INTESTINAL FISTULA  2013  . TUMOR EXCISION  2013   hospitalized and drained.     Family  History  Problem Relation Age of Onset  . Heart disease Mother   . Heart disease Father     Social History  Substance Use Topics  . Smoking status: Former Research scientist (life sciences)  . Smokeless tobacco: Never Used     Comment: stopped in 2012  . Alcohol use Not on file    ---------------------------------------------------------------------------------------------------------------------  Scheduled Meds: Continuous Infusions: PRN Meds:.   BP (!) 153/107 (BP Location: Left Arm, Patient Position: Sitting, Cuff Size: Normal)   Pulse (!) 105   Temp 98 F (36.7 C) (Oral)   Resp 16   Ht 5\' 9"  (1.753 m)   Wt 165 lb (74.8 kg)   SpO2 93%   BMI 24.37 kg/m    BP Readings from Last 3 Encounters:  02/06/17 (!) 153/107     Wt Readings from Last 3 Encounters:  02/06/17 165 lb (74.8 kg)     ----------------------------------------------------------------------------------------------------------------------  ROS Review of Systems  Cardiac: No chest pain Pulmonary: No shortness of breath GI: No hematemesis or gastritis Neurology: As above The remainder of the review of systems was negative  Objective:  BP (!) 153/107 (BP Location: Left Arm, Patient Position: Sitting, Cuff Size: Normal)   Pulse (!) 105   Temp 98 F (36.7 C) (Oral)   Resp 16   Ht 5\' 9"  (1.753 m)   Wt 165 lb (74.8 kg)   SpO2 93%   BMI 24.37 kg/m   Physical Exam Patient is alert oriented cooperative compliant Heart is regular rate and rhythm without murmur Lungs are clear to auscultation Inspection the back reveals a well-healed midline scar with no significant paraspinous muscle tenderness but extreme pain with reproduction of her primary pain characteristic upon extension and lateral rotation at the low back. Her lower extremity strength appears to be well-preserved.     Assessment & Plan:   Alexandra [name] was seen today for tailbone pain and back pain.  Diagnoses and all orders for this visit:  Facet arthritis of  lumbosacral region ([place]) -     ToxASSURE Select 13 (MW), Urine  DDD (degenerative disc disease), lumbar -     ToxASSURE Select 13 (MW), Urine  Chronic bilateral low back pain with bilateral sciatica -     ToxASSURE Select 13 (MW), Urine     ----------------------------------------------------------------------------------------------------------------------  Problem List Items Addressed This Visit    None    Visit Diagnoses    Facet arthritis of lumbosacral region [facility])    -  Primary   Relevant Medications   aspirin 81 MG chewable tablet   celecoxib (CELEBREX) 200 MG capsule   cyclobenzaprine (FLEXERIL) 10 MG tablet   Other Relevant Orders   ToxASSURE Select 13 (MW), Urine   DDD (degenerative disc disease), lumbar       Relevant Medications   aspirin 81 MG chewable tablet   celecoxib (CELEBREX) 200 MG capsule   cyclobenzaprine (FLEXERIL) 10 MG tablet   Other Relevant Orders   ToxASSURE Select 13 (MW), Urine   Chronic bilateral low back pain with bilateral sciatica       Relevant Medications   aspirin 81 MG chewable tablet   celecoxib (CELEBREX) 200 MG capsule   cyclobenzaprine (FLEXERIL) 10 MG tablet   Other Relevant Orders   ToxASSURE Select 13 (MW), Urine      ----------------------------------------------------------------------------------------------------------------------  1. Facet arthritis of lumbosacral region [facility]) I talked to her about a facet injection for therapeutic purpose but she is refusing any further injection therapy. I've also talked about continuing stretching strengthening exercises as I feel that a component of her pain is musculoskeletal. - ToxASSURE Select 13 (MW), Urine  2. DDD (degenerative disc disease), lumbar She is a candidate for a caudal epidural steroid injection as reviewed with her today based on her MRI findings and symptom complex. However she is refusing this today - ToxASSURE Select 13 (MW), Urine  3. Chronic bilateral low  back pain with bilateral sciatica Based on her chronic low back pain and her response to opioids, I think it is reasonable for her to continue on this therapy as her pain has remained recalcitrant and she has responded favorably based on her story today in response to her opioid management. No adverse problems have been noted and I have cautioned her against the concomitant use of opioids and benzodiazepines. This should be spaced out as we discussed today. The benzodiazepine should be when necessary only and not to be used with opioids. I've also talked to her about reducing her total opioid dose to Percocet 5 mg 4 times a day with a slow gradual taper. I'm happy to help Dr. Clemmie Krill  with this or take over this management if necessary. - ToxASSURE Select 13 (MW), Urine    ----------------------------------------------------------------------------------------------------------------------  I am having Alexandra [name] maintain her ALPRAZolam, amitriptyline, aspirin, bifidobacterium infantis, calcium citrate-vitamin D, celecoxib, cyclobenzaprine, docusate sodium, gabapentin, latanoprost, pantoprazole, zolpidem, ferrous sulfate, dimenhyDRINATE, and vitamin C.   Meds ordered this encounter  Medications  . ALPRAZolam (XANAX) 0.5 MG tablet    Sig: Take 0.5 mg by mouth daily.  Marland Kitchen amitriptyline (ELAVIL) 25 MG tablet    Sig: Take 25 mg by mouth at bedtime. Patient may take 1 -2 tablets qhs  . aspirin 81 MG chewable tablet    Sig: Chew 81 mg by mouth daily.  . bifidobacterium infantis (ALIGN) capsule    Sig: Take 1 capsule by mouth daily.  . calcium citrate-vitamin D (CITRACAL+D) 315-200 MG-UNIT tablet    Sig: Take 1 tablet by mouth daily.  . celecoxib (CELEBREX) 200 MG capsule    Sig: Take 200 mg by mouth 2 (two) times daily.  . cyclobenzaprine (FLEXERIL) 10 MG tablet    Sig: Take 10 mg by mouth as needed.  . docusate sodium (COLACE) 100 MG capsule    Sig: Take 1 tablet by mouth as needed.  . gabapentin  (NEURONTIN) 600 MG tablet    Sig: Take 600 mg by mouth 3 (three) times daily.  Marland Kitchen latanoprost (XALATAN) 0.005 % ophthalmic solution    Sig: Place 1 drop into both eyes at bedtime.  . pantoprazole (PROTONIX) 40 MG tablet    Sig: Take 40 mg by mouth 2 (two) times daily.  Marland Kitchen zolpidem (AMBIEN) 10 MG tablet    Sig: Take 10 mg by mouth at bedtime.  . ferrous sulfate 325 (65 FE) MG tablet    Sig: Take 325 mg by mouth daily with breakfast.  . dimenhyDRINATE (DRAMAMINE) 50 MG tablet    Sig: Take 25 mg by mouth every 8 (eight) hours as needed.  . vitamin C (ASCORBIC ACID) 500 MG tablet    Sig: Take 500 mg by mouth daily.       Follow-up: Return in about 1 month (around 03/06/2017) for evaluation, med refill.    Molli Barrows, MD 1:00 PM  The [place] practitioner database for opioid medications on this patient has been reviewed by me and my staff   Greater than 50% of the total encounter time was spent in counseling and / or coordination of care.     This dictation was performed utilizing Systems analyst.  Please excuse any unintentional or mistaken typographical errors as a result.

## 2017-02-09 ENCOUNTER — Other Ambulatory Visit: Payer: Self-pay | Admitting: Neurology

## 2017-02-09 DIAGNOSIS — M5136 Other intervertebral disc degeneration, lumbar region: Secondary | ICD-10-CM

## 2017-02-09 DIAGNOSIS — G609 Hereditary and idiopathic neuropathy, unspecified: Secondary | ICD-10-CM

## 2017-02-13 DIAGNOSIS — F411 Generalized anxiety disorder: Secondary | ICD-10-CM | POA: Diagnosis not present

## 2017-02-13 DIAGNOSIS — G894 Chronic pain syndrome: Secondary | ICD-10-CM | POA: Diagnosis not present

## 2017-02-13 DIAGNOSIS — E782 Mixed hyperlipidemia: Secondary | ICD-10-CM | POA: Diagnosis not present

## 2017-02-13 DIAGNOSIS — Z79899 Other long term (current) drug therapy: Secondary | ICD-10-CM | POA: Diagnosis not present

## 2017-02-13 DIAGNOSIS — Z6823 Body mass index (BMI) 23.0-23.9, adult: Secondary | ICD-10-CM | POA: Diagnosis not present

## 2017-02-13 LAB — TOXASSURE SELECT 13 (MW), URINE

## 2017-02-14 DIAGNOSIS — H401131 Primary open-angle glaucoma, bilateral, mild stage: Secondary | ICD-10-CM | POA: Diagnosis not present

## 2017-02-21 ENCOUNTER — Ambulatory Visit: Payer: PPO

## 2017-02-23 ENCOUNTER — Ambulatory Visit: Payer: PPO

## 2017-02-27 DIAGNOSIS — H401131 Primary open-angle glaucoma, bilateral, mild stage: Secondary | ICD-10-CM | POA: Diagnosis not present

## 2017-03-02 DIAGNOSIS — G608 Other hereditary and idiopathic neuropathies: Secondary | ICD-10-CM | POA: Diagnosis not present

## 2017-03-06 ENCOUNTER — Ambulatory Visit: Payer: PPO | Admitting: Anesthesiology

## 2017-03-08 ENCOUNTER — Ambulatory Visit
Admission: RE | Admit: 2017-03-08 | Discharge: 2017-03-08 | Disposition: A | Discharge status: Home / Self Care | Payer: PPO | Source: Ambulatory Visit | Attending: Neurology | Admitting: Neurology

## 2017-03-08 DIAGNOSIS — M5126 Other intervertebral disc displacement, lumbar region: Secondary | ICD-10-CM | POA: Insufficient documentation

## 2017-03-08 DIAGNOSIS — M4804 Spinal stenosis, thoracic region: Secondary | ICD-10-CM | POA: Diagnosis not present

## 2017-03-08 DIAGNOSIS — M4186 Other forms of scoliosis, lumbar region: Secondary | ICD-10-CM | POA: Diagnosis not present

## 2017-03-08 DIAGNOSIS — M48061 Spinal stenosis, lumbar region without neurogenic claudication: Secondary | ICD-10-CM | POA: Diagnosis not present

## 2017-03-08 DIAGNOSIS — M47816 Spondylosis without myelopathy or radiculopathy, lumbar region: Secondary | ICD-10-CM | POA: Insufficient documentation

## 2017-03-08 DIAGNOSIS — M5136 Other intervertebral disc degeneration, lumbar region: Secondary | ICD-10-CM | POA: Diagnosis not present

## 2017-03-08 DIAGNOSIS — M4316 Spondylolisthesis, lumbar region: Secondary | ICD-10-CM | POA: Insufficient documentation

## 2017-03-08 DIAGNOSIS — G609 Hereditary and idiopathic neuropathy, unspecified: Secondary | ICD-10-CM

## 2017-03-08 MED ORDER — GADOBENATE DIMEGLUMINE 529 MG/ML IV SOLN
15.0000 mL | Freq: Once | INTRAVENOUS | Status: AC | PRN
  Administered 2017-03-08: 15 mL via INTRAVENOUS

## 2017-03-14 DIAGNOSIS — M5136 Other intervertebral disc degeneration, lumbar region: Secondary | ICD-10-CM | POA: Diagnosis not present

## 2017-03-14 DIAGNOSIS — G609 Hereditary and idiopathic neuropathy, unspecified: Secondary | ICD-10-CM | POA: Diagnosis not present

## 2017-03-14 DIAGNOSIS — G608 Other hereditary and idiopathic neuropathies: Secondary | ICD-10-CM | POA: Diagnosis not present

## 2017-03-15 DIAGNOSIS — F411 Generalized anxiety disorder: Secondary | ICD-10-CM | POA: Diagnosis not present

## 2017-03-15 DIAGNOSIS — G894 Chronic pain syndrome: Secondary | ICD-10-CM | POA: Diagnosis not present

## 2017-03-15 DIAGNOSIS — D485 Neoplasm of uncertain behavior of skin: Secondary | ICD-10-CM | POA: Diagnosis not present

## 2017-04-19 DIAGNOSIS — F5101 Primary insomnia: Secondary | ICD-10-CM | POA: Diagnosis not present

## 2017-04-19 DIAGNOSIS — G894 Chronic pain syndrome: Secondary | ICD-10-CM | POA: Diagnosis not present

## 2017-04-19 DIAGNOSIS — F411 Generalized anxiety disorder: Secondary | ICD-10-CM | POA: Diagnosis not present

## 2017-04-19 DIAGNOSIS — Z6824 Body mass index (BMI) 24.0-24.9, adult: Secondary | ICD-10-CM | POA: Diagnosis not present

## 2017-04-19 DIAGNOSIS — D485 Neoplasm of uncertain behavior of skin: Secondary | ICD-10-CM | POA: Diagnosis not present

## 2017-05-18 DIAGNOSIS — D485 Neoplasm of uncertain behavior of skin: Secondary | ICD-10-CM | POA: Diagnosis not present

## 2017-05-18 DIAGNOSIS — F5101 Primary insomnia: Secondary | ICD-10-CM | POA: Diagnosis not present

## 2017-05-18 DIAGNOSIS — Z6823 Body mass index (BMI) 23.0-23.9, adult: Secondary | ICD-10-CM | POA: Diagnosis not present

## 2017-05-18 DIAGNOSIS — G894 Chronic pain syndrome: Secondary | ICD-10-CM | POA: Diagnosis not present

## 2017-05-18 DIAGNOSIS — F411 Generalized anxiety disorder: Secondary | ICD-10-CM | POA: Diagnosis not present

## 2017-06-11 DIAGNOSIS — G894 Chronic pain syndrome: Secondary | ICD-10-CM | POA: Diagnosis not present

## 2017-06-11 DIAGNOSIS — D485 Neoplasm of uncertain behavior of skin: Secondary | ICD-10-CM | POA: Diagnosis not present

## 2017-06-11 DIAGNOSIS — Z6823 Body mass index (BMI) 23.0-23.9, adult: Secondary | ICD-10-CM | POA: Diagnosis not present

## 2017-06-11 DIAGNOSIS — F5101 Primary insomnia: Secondary | ICD-10-CM | POA: Diagnosis not present

## 2017-06-11 DIAGNOSIS — F411 Generalized anxiety disorder: Secondary | ICD-10-CM | POA: Diagnosis not present

## 2017-07-12 DIAGNOSIS — F411 Generalized anxiety disorder: Secondary | ICD-10-CM | POA: Diagnosis not present

## 2017-07-12 DIAGNOSIS — F5101 Primary insomnia: Secondary | ICD-10-CM | POA: Diagnosis not present

## 2017-07-12 DIAGNOSIS — D485 Neoplasm of uncertain behavior of skin: Secondary | ICD-10-CM | POA: Diagnosis not present

## 2017-07-12 DIAGNOSIS — Z6823 Body mass index (BMI) 23.0-23.9, adult: Secondary | ICD-10-CM | POA: Diagnosis not present

## 2017-07-12 DIAGNOSIS — G894 Chronic pain syndrome: Secondary | ICD-10-CM | POA: Diagnosis not present

## 2017-07-12 DIAGNOSIS — B009 Herpesviral infection, unspecified: Secondary | ICD-10-CM | POA: Diagnosis not present

## 2017-07-12 DIAGNOSIS — R350 Frequency of micturition: Secondary | ICD-10-CM | POA: Diagnosis not present

## 2017-08-10 DIAGNOSIS — Z6823 Body mass index (BMI) 23.0-23.9, adult: Secondary | ICD-10-CM | POA: Diagnosis not present

## 2017-08-10 DIAGNOSIS — F411 Generalized anxiety disorder: Secondary | ICD-10-CM | POA: Diagnosis not present

## 2017-08-10 DIAGNOSIS — D485 Neoplasm of uncertain behavior of skin: Secondary | ICD-10-CM | POA: Diagnosis not present

## 2017-08-10 DIAGNOSIS — G894 Chronic pain syndrome: Secondary | ICD-10-CM | POA: Diagnosis not present

## 2017-08-10 DIAGNOSIS — F5101 Primary insomnia: Secondary | ICD-10-CM | POA: Diagnosis not present

## 2017-08-27 DIAGNOSIS — H401131 Primary open-angle glaucoma, bilateral, mild stage: Secondary | ICD-10-CM | POA: Diagnosis not present

## 2017-09-12 DIAGNOSIS — Z79899 Other long term (current) drug therapy: Secondary | ICD-10-CM | POA: Diagnosis not present

## 2017-09-12 DIAGNOSIS — G894 Chronic pain syndrome: Secondary | ICD-10-CM | POA: Diagnosis not present

## 2017-09-12 DIAGNOSIS — F411 Generalized anxiety disorder: Secondary | ICD-10-CM | POA: Diagnosis not present

## 2017-09-12 DIAGNOSIS — Z6823 Body mass index (BMI) 23.0-23.9, adult: Secondary | ICD-10-CM | POA: Diagnosis not present

## 2017-09-12 DIAGNOSIS — F5101 Primary insomnia: Secondary | ICD-10-CM | POA: Diagnosis not present

## 2017-09-12 DIAGNOSIS — Z23 Encounter for immunization: Secondary | ICD-10-CM | POA: Diagnosis not present

## 2017-09-12 DIAGNOSIS — Z79891 Long term (current) use of opiate analgesic: Secondary | ICD-10-CM | POA: Diagnosis not present

## 2017-10-15 DIAGNOSIS — G894 Chronic pain syndrome: Secondary | ICD-10-CM | POA: Diagnosis not present

## 2017-10-15 DIAGNOSIS — F411 Generalized anxiety disorder: Secondary | ICD-10-CM | POA: Diagnosis not present

## 2017-10-15 DIAGNOSIS — Z6823 Body mass index (BMI) 23.0-23.9, adult: Secondary | ICD-10-CM | POA: Diagnosis not present

## 2017-11-14 DIAGNOSIS — R05 Cough: Secondary | ICD-10-CM | POA: Diagnosis not present

## 2017-11-14 DIAGNOSIS — Z6822 Body mass index (BMI) 22.0-22.9, adult: Secondary | ICD-10-CM | POA: Diagnosis not present

## 2017-11-14 DIAGNOSIS — G894 Chronic pain syndrome: Secondary | ICD-10-CM | POA: Diagnosis not present

## 2017-11-14 DIAGNOSIS — D519 Vitamin B12 deficiency anemia, unspecified: Secondary | ICD-10-CM | POA: Diagnosis not present

## 2017-12-14 DIAGNOSIS — G894 Chronic pain syndrome: Secondary | ICD-10-CM | POA: Diagnosis not present

## 2017-12-14 DIAGNOSIS — Z6823 Body mass index (BMI) 23.0-23.9, adult: Secondary | ICD-10-CM | POA: Diagnosis not present

## 2017-12-14 DIAGNOSIS — F411 Generalized anxiety disorder: Secondary | ICD-10-CM | POA: Diagnosis not present

## 2018-01-09 DIAGNOSIS — Z6823 Body mass index (BMI) 23.0-23.9, adult: Secondary | ICD-10-CM | POA: Diagnosis not present

## 2018-01-09 DIAGNOSIS — G894 Chronic pain syndrome: Secondary | ICD-10-CM | POA: Diagnosis not present

## 2018-01-09 DIAGNOSIS — R030 Elevated blood-pressure reading, without diagnosis of hypertension: Secondary | ICD-10-CM | POA: Diagnosis not present

## 2018-01-09 DIAGNOSIS — F411 Generalized anxiety disorder: Secondary | ICD-10-CM | POA: Diagnosis not present

## 2018-02-25 DIAGNOSIS — H401131 Primary open-angle glaucoma, bilateral, mild stage: Secondary | ICD-10-CM | POA: Diagnosis not present

## 2018-03-04 DIAGNOSIS — H401131 Primary open-angle glaucoma, bilateral, mild stage: Secondary | ICD-10-CM | POA: Diagnosis not present

## 2018-03-08 DIAGNOSIS — E611 Iron deficiency: Secondary | ICD-10-CM | POA: Diagnosis not present

## 2018-03-08 DIAGNOSIS — E782 Mixed hyperlipidemia: Secondary | ICD-10-CM | POA: Diagnosis not present

## 2018-03-08 DIAGNOSIS — F411 Generalized anxiety disorder: Secondary | ICD-10-CM | POA: Diagnosis not present

## 2018-03-08 DIAGNOSIS — Z6823 Body mass index (BMI) 23.0-23.9, adult: Secondary | ICD-10-CM | POA: Diagnosis not present

## 2018-03-08 DIAGNOSIS — K219 Gastro-esophageal reflux disease without esophagitis: Secondary | ICD-10-CM | POA: Diagnosis not present

## 2018-03-08 DIAGNOSIS — F5101 Primary insomnia: Secondary | ICD-10-CM | POA: Diagnosis not present

## 2018-03-08 DIAGNOSIS — G894 Chronic pain syndrome: Secondary | ICD-10-CM | POA: Diagnosis not present

## 2018-03-08 DIAGNOSIS — Z79891 Long term (current) use of opiate analgesic: Secondary | ICD-10-CM | POA: Diagnosis not present

## 2018-03-08 DIAGNOSIS — Z7689 Persons encountering health services in other specified circumstances: Secondary | ICD-10-CM | POA: Diagnosis not present

## 2018-03-08 DIAGNOSIS — R5383 Other fatigue: Secondary | ICD-10-CM | POA: Diagnosis not present

## 2018-03-08 DIAGNOSIS — D519 Vitamin B12 deficiency anemia, unspecified: Secondary | ICD-10-CM | POA: Diagnosis not present

## 2018-03-08 DIAGNOSIS — R030 Elevated blood-pressure reading, without diagnosis of hypertension: Secondary | ICD-10-CM | POA: Diagnosis not present

## 2018-05-02 DIAGNOSIS — Z6823 Body mass index (BMI) 23.0-23.9, adult: Secondary | ICD-10-CM | POA: Diagnosis not present

## 2018-05-02 DIAGNOSIS — E782 Mixed hyperlipidemia: Secondary | ICD-10-CM | POA: Diagnosis not present

## 2018-05-02 DIAGNOSIS — K219 Gastro-esophageal reflux disease without esophagitis: Secondary | ICD-10-CM | POA: Diagnosis not present

## 2018-05-02 DIAGNOSIS — G894 Chronic pain syndrome: Secondary | ICD-10-CM | POA: Diagnosis not present

## 2018-05-02 DIAGNOSIS — F411 Generalized anxiety disorder: Secondary | ICD-10-CM | POA: Diagnosis not present

## 2018-05-02 DIAGNOSIS — N393 Stress incontinence (female) (male): Secondary | ICD-10-CM | POA: Diagnosis not present

## 2018-05-02 DIAGNOSIS — Z79899 Other long term (current) drug therapy: Secondary | ICD-10-CM | POA: Diagnosis not present

## 2018-05-02 DIAGNOSIS — Z0001 Encounter for general adult medical examination with abnormal findings: Secondary | ICD-10-CM | POA: Diagnosis not present

## 2018-06-11 DIAGNOSIS — Z6823 Body mass index (BMI) 23.0-23.9, adult: Secondary | ICD-10-CM | POA: Diagnosis not present

## 2018-06-11 DIAGNOSIS — G894 Chronic pain syndrome: Secondary | ICD-10-CM | POA: Diagnosis not present

## 2018-06-11 DIAGNOSIS — F411 Generalized anxiety disorder: Secondary | ICD-10-CM | POA: Diagnosis not present

## 2018-08-02 DIAGNOSIS — F411 Generalized anxiety disorder: Secondary | ICD-10-CM | POA: Diagnosis not present

## 2018-08-02 DIAGNOSIS — R030 Elevated blood-pressure reading, without diagnosis of hypertension: Secondary | ICD-10-CM | POA: Diagnosis not present

## 2018-08-02 DIAGNOSIS — F5101 Primary insomnia: Secondary | ICD-10-CM | POA: Diagnosis not present

## 2018-08-02 DIAGNOSIS — Z6823 Body mass index (BMI) 23.0-23.9, adult: Secondary | ICD-10-CM | POA: Diagnosis not present

## 2018-08-02 DIAGNOSIS — G894 Chronic pain syndrome: Secondary | ICD-10-CM | POA: Diagnosis not present

## 2018-09-02 DIAGNOSIS — H401131 Primary open-angle glaucoma, bilateral, mild stage: Secondary | ICD-10-CM | POA: Diagnosis not present

## 2018-10-09 DIAGNOSIS — G894 Chronic pain syndrome: Secondary | ICD-10-CM | POA: Diagnosis not present

## 2018-10-09 DIAGNOSIS — Z6823 Body mass index (BMI) 23.0-23.9, adult: Secondary | ICD-10-CM | POA: Diagnosis not present

## 2018-10-09 DIAGNOSIS — R030 Elevated blood-pressure reading, without diagnosis of hypertension: Secondary | ICD-10-CM | POA: Diagnosis not present

## 2018-10-09 DIAGNOSIS — F411 Generalized anxiety disorder: Secondary | ICD-10-CM | POA: Diagnosis not present

## 2018-10-09 DIAGNOSIS — F5101 Primary insomnia: Secondary | ICD-10-CM | POA: Diagnosis not present

## 2018-12-06 DIAGNOSIS — Z6823 Body mass index (BMI) 23.0-23.9, adult: Secondary | ICD-10-CM | POA: Diagnosis not present

## 2018-12-06 DIAGNOSIS — R634 Abnormal weight loss: Secondary | ICD-10-CM | POA: Diagnosis not present

## 2018-12-06 DIAGNOSIS — M109 Gout, unspecified: Secondary | ICD-10-CM | POA: Diagnosis not present

## 2018-12-06 DIAGNOSIS — G894 Chronic pain syndrome: Secondary | ICD-10-CM | POA: Diagnosis not present

## 2019-01-20 ENCOUNTER — Other Ambulatory Visit: Payer: Self-pay | Admitting: Pharmacist

## 2019-01-22 NOTE — Patient Outreach (Signed)
[facility]) Care Management  [place]   01/22/2019  SAMONA CHIHUAHUA 04-11-1941 [id number]  [facility] Medication: simvastatin 40 mg Date & Supply of last refill: 07/20/2018, 90 day supply Current insurance:[employer]   Outreach:  Incoming call from [name] in response to the [facility] Medication Adherence Campaign. Speak with patient. HIPAA identifiers verified.  Subjective:  Ms. Dombek reports that she takes her simvastatin 40 mg once daily as directed. Denies missed doses or barriers to adherence. Counsel patient on the importance of medication adherence. Note that patient last had the prescription refilled on 07/20/2018. Patient locates her simvastatin pill bottle and states that she has just a couple of tablets remaining. Reports that she is planning to call this one into her pharmacy. States that she believes that she might have had extra of this medication. Patient reports that she currently uses a weekly pillbox for her medications. Counsel patient about additional adherence strategies.  Call patient's [facility]. Pharmacist confirms patient's last refill date and reports that patient's last prescription was out of refills, but that she has a new prescription on hold for the patient. Reports that she will get this prescription ready for Ms. Scherr.    Objective: Lab Results  Component Value Date   CREATININE 0.93 08/09/2012   CREATININE 0.91 01/24/2012   CREATININE 0.87 01/22/2012    No results found for: HGBA1C  Lipid Panel  No results found for: CHOL, TRIG, HDL, CHOLHDL, VLDL, LDLCALC, LDLDIRECT  BP Readings from Last 3 Encounters:  02/06/17 (!) 153/107    Allergies  Allergen Reactions  . Avelox [Moxifloxacin Hcl] Shortness Of Breath and Swelling  . Sulfur Other (See Comments)    Reaction as a child and not sure what.      Assessment:  . Patient currently past due on refill of simvastatin.   Plan:  1) Patient to pick up simvastatin  refill from her pharmacy, continue to use weekly pillbox to aid with her adherence.  2) Will close pharmacy episode at this time.   Harlow Asa, PharmD, [facility] [phone]

## 2019-02-06 DIAGNOSIS — F411 Generalized anxiety disorder: Secondary | ICD-10-CM | POA: Diagnosis not present

## 2019-02-06 DIAGNOSIS — K120 Recurrent oral aphthae: Secondary | ICD-10-CM | POA: Diagnosis not present

## 2019-02-06 DIAGNOSIS — Z6824 Body mass index (BMI) 24.0-24.9, adult: Secondary | ICD-10-CM | POA: Diagnosis not present

## 2019-02-06 DIAGNOSIS — Z79891 Long term (current) use of opiate analgesic: Secondary | ICD-10-CM | POA: Diagnosis not present

## 2019-02-06 DIAGNOSIS — Z23 Encounter for immunization: Secondary | ICD-10-CM | POA: Diagnosis not present

## 2019-02-06 DIAGNOSIS — F5101 Primary insomnia: Secondary | ICD-10-CM | POA: Diagnosis not present

## 2019-02-06 DIAGNOSIS — G894 Chronic pain syndrome: Secondary | ICD-10-CM | POA: Diagnosis not present

## 2019-02-25 DIAGNOSIS — D045 Carcinoma in situ of skin of trunk: Secondary | ICD-10-CM | POA: Diagnosis not present

## 2019-02-25 DIAGNOSIS — D485 Neoplasm of uncertain behavior of skin: Secondary | ICD-10-CM | POA: Diagnosis not present

## 2019-02-25 DIAGNOSIS — C4401 Basal cell carcinoma of skin of lip: Secondary | ICD-10-CM | POA: Diagnosis not present

## 2019-02-25 DIAGNOSIS — L821 Other seborrheic keratosis: Secondary | ICD-10-CM | POA: Diagnosis not present

## 2019-03-04 DIAGNOSIS — H401131 Primary open-angle glaucoma, bilateral, mild stage: Secondary | ICD-10-CM | POA: Diagnosis not present

## 2019-03-11 DIAGNOSIS — C4401 Basal cell carcinoma of skin of lip: Secondary | ICD-10-CM | POA: Diagnosis not present

## 2019-03-11 DIAGNOSIS — C4491 Basal cell carcinoma of skin, unspecified: Secondary | ICD-10-CM | POA: Diagnosis not present

## 2019-04-02 DIAGNOSIS — D045 Carcinoma in situ of skin of trunk: Secondary | ICD-10-CM | POA: Diagnosis not present

## 2019-04-02 DIAGNOSIS — C44529 Squamous cell carcinoma of skin of other part of trunk: Secondary | ICD-10-CM | POA: Diagnosis not present

## 2019-04-30 DIAGNOSIS — F411 Generalized anxiety disorder: Secondary | ICD-10-CM | POA: Diagnosis not present

## 2019-04-30 DIAGNOSIS — G894 Chronic pain syndrome: Secondary | ICD-10-CM | POA: Diagnosis not present

## 2019-04-30 DIAGNOSIS — F5101 Primary insomnia: Secondary | ICD-10-CM | POA: Diagnosis not present

## 2019-06-17 DIAGNOSIS — R898 Other abnormal findings in specimens from other organs, systems and tissues: Secondary | ICD-10-CM | POA: Diagnosis not present

## 2019-07-15 DIAGNOSIS — L910 Hypertrophic scar: Secondary | ICD-10-CM | POA: Diagnosis not present

## 2019-08-11 DIAGNOSIS — Z859 Personal history of malignant neoplasm, unspecified: Secondary | ICD-10-CM | POA: Diagnosis not present

## 2019-08-20 DIAGNOSIS — G894 Chronic pain syndrome: Secondary | ICD-10-CM | POA: Diagnosis not present

## 2019-08-20 DIAGNOSIS — F411 Generalized anxiety disorder: Secondary | ICD-10-CM | POA: Diagnosis not present

## 2019-08-20 DIAGNOSIS — F5101 Primary insomnia: Secondary | ICD-10-CM | POA: Diagnosis not present

## 2019-09-02 DIAGNOSIS — Z859 Personal history of malignant neoplasm, unspecified: Secondary | ICD-10-CM | POA: Diagnosis not present

## 2019-09-15 DIAGNOSIS — H401131 Primary open-angle glaucoma, bilateral, mild stage: Secondary | ICD-10-CM | POA: Diagnosis not present

## 2019-10-03 DIAGNOSIS — Z23 Encounter for immunization: Secondary | ICD-10-CM | POA: Diagnosis not present

## 2019-11-19 DIAGNOSIS — F411 Generalized anxiety disorder: Secondary | ICD-10-CM | POA: Diagnosis not present

## 2019-11-19 DIAGNOSIS — G894 Chronic pain syndrome: Secondary | ICD-10-CM | POA: Diagnosis not present

## 2019-11-19 DIAGNOSIS — F5101 Primary insomnia: Secondary | ICD-10-CM | POA: Diagnosis not present

## 2020-02-18 DIAGNOSIS — Z0001 Encounter for general adult medical examination with abnormal findings: Secondary | ICD-10-CM | POA: Diagnosis not present

## 2020-02-18 DIAGNOSIS — G894 Chronic pain syndrome: Secondary | ICD-10-CM | POA: Diagnosis not present

## 2020-02-18 DIAGNOSIS — F411 Generalized anxiety disorder: Secondary | ICD-10-CM | POA: Diagnosis not present

## 2020-02-18 DIAGNOSIS — K219 Gastro-esophageal reflux disease without esophagitis: Secondary | ICD-10-CM | POA: Diagnosis not present

## 2020-02-18 DIAGNOSIS — Z7189 Other specified counseling: Secondary | ICD-10-CM | POA: Diagnosis not present

## 2020-02-18 DIAGNOSIS — Z6826 Body mass index (BMI) 26.0-26.9, adult: Secondary | ICD-10-CM | POA: Diagnosis not present

## 2020-02-18 DIAGNOSIS — R490 Dysphonia: Secondary | ICD-10-CM | POA: Diagnosis not present

## 2020-02-18 DIAGNOSIS — F5104 Psychophysiologic insomnia: Secondary | ICD-10-CM | POA: Diagnosis not present

## 2020-02-18 DIAGNOSIS — Z79891 Long term (current) use of opiate analgesic: Secondary | ICD-10-CM | POA: Diagnosis not present

## 2020-02-18 DIAGNOSIS — E782 Mixed hyperlipidemia: Secondary | ICD-10-CM | POA: Diagnosis not present

## 2020-02-18 DIAGNOSIS — N393 Stress incontinence (female) (male): Secondary | ICD-10-CM | POA: Diagnosis not present

## 2020-03-15 DIAGNOSIS — H401131 Primary open-angle glaucoma, bilateral, mild stage: Secondary | ICD-10-CM | POA: Diagnosis not present

## 2020-05-18 DIAGNOSIS — M545 Low back pain: Secondary | ICD-10-CM | POA: Diagnosis not present

## 2020-05-18 DIAGNOSIS — G894 Chronic pain syndrome: Secondary | ICD-10-CM | POA: Diagnosis not present

## 2020-07-06 DIAGNOSIS — Z6825 Body mass index (BMI) 25.0-25.9, adult: Secondary | ICD-10-CM | POA: Diagnosis not present

## 2020-07-06 DIAGNOSIS — N3000 Acute cystitis without hematuria: Secondary | ICD-10-CM | POA: Diagnosis not present

## 2020-08-16 DIAGNOSIS — Z79891 Long term (current) use of opiate analgesic: Secondary | ICD-10-CM | POA: Diagnosis not present

## 2020-08-16 DIAGNOSIS — Z6825 Body mass index (BMI) 25.0-25.9, adult: Secondary | ICD-10-CM | POA: Diagnosis not present

## 2020-08-16 DIAGNOSIS — F411 Generalized anxiety disorder: Secondary | ICD-10-CM | POA: Diagnosis not present

## 2020-08-16 DIAGNOSIS — G894 Chronic pain syndrome: Secondary | ICD-10-CM | POA: Diagnosis not present

## 2020-08-16 DIAGNOSIS — F5101 Primary insomnia: Secondary | ICD-10-CM | POA: Diagnosis not present

## 2020-09-29 DIAGNOSIS — H401131 Primary open-angle glaucoma, bilateral, mild stage: Secondary | ICD-10-CM | POA: Diagnosis not present

## 2020-11-24 DIAGNOSIS — F5104 Psychophysiologic insomnia: Secondary | ICD-10-CM | POA: Diagnosis not present

## 2020-11-24 DIAGNOSIS — I10 Essential (primary) hypertension: Secondary | ICD-10-CM | POA: Diagnosis not present

## 2020-11-24 DIAGNOSIS — G894 Chronic pain syndrome: Secondary | ICD-10-CM | POA: Diagnosis not present

## 2021-02-28 DIAGNOSIS — F5101 Primary insomnia: Secondary | ICD-10-CM | POA: Diagnosis not present

## 2021-02-28 DIAGNOSIS — G894 Chronic pain syndrome: Secondary | ICD-10-CM | POA: Diagnosis not present

## 2021-02-28 DIAGNOSIS — Z6826 Body mass index (BMI) 26.0-26.9, adult: Secondary | ICD-10-CM | POA: Diagnosis not present

## 2021-02-28 DIAGNOSIS — Z79891 Long term (current) use of opiate analgesic: Secondary | ICD-10-CM | POA: Diagnosis not present

## 2021-02-28 DIAGNOSIS — F411 Generalized anxiety disorder: Secondary | ICD-10-CM | POA: Diagnosis not present

## 2021-03-30 DIAGNOSIS — H401131 Primary open-angle glaucoma, bilateral, mild stage: Secondary | ICD-10-CM | POA: Diagnosis not present

## 2021-05-26 DIAGNOSIS — R5383 Other fatigue: Secondary | ICD-10-CM | POA: Diagnosis not present

## 2021-05-26 DIAGNOSIS — I10 Essential (primary) hypertension: Secondary | ICD-10-CM | POA: Diagnosis not present

## 2021-05-26 DIAGNOSIS — F5104 Psychophysiologic insomnia: Secondary | ICD-10-CM | POA: Diagnosis not present

## 2021-05-26 DIAGNOSIS — Z79899 Other long term (current) drug therapy: Secondary | ICD-10-CM | POA: Diagnosis not present

## 2021-05-26 DIAGNOSIS — R059 Cough, unspecified: Secondary | ICD-10-CM | POA: Diagnosis not present

## 2021-05-26 DIAGNOSIS — K219 Gastro-esophageal reflux disease without esophagitis: Secondary | ICD-10-CM | POA: Diagnosis not present

## 2021-05-26 DIAGNOSIS — Z7189 Other specified counseling: Secondary | ICD-10-CM | POA: Diagnosis not present

## 2021-05-26 DIAGNOSIS — N393 Stress incontinence (female) (male): Secondary | ICD-10-CM | POA: Diagnosis not present

## 2021-05-26 DIAGNOSIS — Z0001 Encounter for general adult medical examination with abnormal findings: Secondary | ICD-10-CM | POA: Diagnosis not present

## 2021-05-26 DIAGNOSIS — F411 Generalized anxiety disorder: Secondary | ICD-10-CM | POA: Diagnosis not present

## 2021-05-26 DIAGNOSIS — R413 Other amnesia: Secondary | ICD-10-CM | POA: Diagnosis not present

## 2021-05-26 DIAGNOSIS — Z79891 Long term (current) use of opiate analgesic: Secondary | ICD-10-CM | POA: Diagnosis not present

## 2021-05-26 DIAGNOSIS — D518 Other vitamin B12 deficiency anemias: Secondary | ICD-10-CM | POA: Diagnosis not present

## 2021-05-26 DIAGNOSIS — G894 Chronic pain syndrome: Secondary | ICD-10-CM | POA: Diagnosis not present

## 2021-05-26 DIAGNOSIS — R030 Elevated blood-pressure reading, without diagnosis of hypertension: Secondary | ICD-10-CM | POA: Diagnosis not present

## 2021-05-26 DIAGNOSIS — Z6825 Body mass index (BMI) 25.0-25.9, adult: Secondary | ICD-10-CM | POA: Diagnosis not present

## 2021-05-26 DIAGNOSIS — E782 Mixed hyperlipidemia: Secondary | ICD-10-CM | POA: Diagnosis not present

## 2021-05-27 ENCOUNTER — Ambulatory Visit
Admission: RE | Admit: 2021-05-27 | Discharge: 2021-05-27 | Disposition: A | Discharge status: Home / Self Care | Payer: PPO | Source: Ambulatory Visit | Attending: Family Medicine | Admitting: Family Medicine

## 2021-05-27 ENCOUNTER — Ambulatory Visit
Admission: RE | Admit: 2021-05-27 | Discharge: 2021-05-27 | Disposition: A | Discharge status: Home / Self Care | Payer: PPO | Attending: Family Medicine | Admitting: Family Medicine

## 2021-05-27 ENCOUNTER — Other Ambulatory Visit: Payer: Self-pay | Admitting: Family Medicine

## 2021-05-27 ENCOUNTER — Other Ambulatory Visit: Payer: Self-pay

## 2021-05-27 DIAGNOSIS — R059 Cough, unspecified: Secondary | ICD-10-CM

## 2021-09-27 DIAGNOSIS — H401131 Primary open-angle glaucoma, bilateral, mild stage: Secondary | ICD-10-CM | POA: Diagnosis not present

## 2021-11-22 DIAGNOSIS — F5101 Primary insomnia: Secondary | ICD-10-CM | POA: Diagnosis not present

## 2021-11-22 DIAGNOSIS — G894 Chronic pain syndrome: Secondary | ICD-10-CM | POA: Diagnosis not present

## 2021-11-22 DIAGNOSIS — K219 Gastro-esophageal reflux disease without esophagitis: Secondary | ICD-10-CM | POA: Diagnosis not present

## 2021-11-22 DIAGNOSIS — Z79891 Long term (current) use of opiate analgesic: Secondary | ICD-10-CM | POA: Diagnosis not present

## 2021-11-22 DIAGNOSIS — Z6826 Body mass index (BMI) 26.0-26.9, adult: Secondary | ICD-10-CM | POA: Diagnosis not present

## 2021-11-22 DIAGNOSIS — F411 Generalized anxiety disorder: Secondary | ICD-10-CM | POA: Diagnosis not present

## 2021-12-16 ENCOUNTER — Emergency Department: Payer: PPO

## 2021-12-16 ENCOUNTER — Other Ambulatory Visit: Payer: Self-pay

## 2021-12-16 ENCOUNTER — Encounter: Payer: Self-pay | Admitting: Emergency Medicine

## 2021-12-16 ENCOUNTER — Inpatient Hospital Stay
Admission: EM | Admit: 2021-12-16 | Discharge: 2021-12-24 | DRG: 480 | Disposition: A | Discharge status: Home Health | Payer: PPO | Attending: Internal Medicine | Admitting: Internal Medicine

## 2021-12-16 DIAGNOSIS — Z90710 Acquired absence of both cervix and uterus: Secondary | ICD-10-CM

## 2021-12-16 DIAGNOSIS — J323 Chronic sphenoidal sinusitis: Secondary | ICD-10-CM | POA: Diagnosis present

## 2021-12-16 DIAGNOSIS — Z7982 Long term (current) use of aspirin: Secondary | ICD-10-CM | POA: Diagnosis not present

## 2021-12-16 DIAGNOSIS — M270 Developmental disorders of jaws: Secondary | ICD-10-CM | POA: Diagnosis present

## 2021-12-16 DIAGNOSIS — F32A Depression, unspecified: Secondary | ICD-10-CM | POA: Diagnosis present

## 2021-12-16 DIAGNOSIS — G4700 Insomnia, unspecified: Secondary | ICD-10-CM

## 2021-12-16 DIAGNOSIS — K219 Gastro-esophageal reflux disease without esophagitis: Secondary | ICD-10-CM | POA: Diagnosis present

## 2021-12-16 DIAGNOSIS — S72009A Fracture of unspecified part of neck of unspecified femur, initial encounter for closed fracture: Secondary | ICD-10-CM

## 2021-12-16 DIAGNOSIS — M25551 Pain in right hip: Secondary | ICD-10-CM | POA: Diagnosis present

## 2021-12-16 DIAGNOSIS — Z79899 Other long term (current) drug therapy: Secondary | ICD-10-CM | POA: Diagnosis not present

## 2021-12-16 DIAGNOSIS — S7291XA Unspecified fracture of right femur, initial encounter for closed fracture: Secondary | ICD-10-CM | POA: Diagnosis present

## 2021-12-16 DIAGNOSIS — W1839XA Other fall on same level, initial encounter: Secondary | ICD-10-CM | POA: Diagnosis present

## 2021-12-16 DIAGNOSIS — R918 Other nonspecific abnormal finding of lung field: Secondary | ICD-10-CM

## 2021-12-16 DIAGNOSIS — J9601 Acute respiratory failure with hypoxia: Secondary | ICD-10-CM | POA: Diagnosis not present

## 2021-12-16 DIAGNOSIS — L509 Urticaria, unspecified: Secondary | ICD-10-CM | POA: Diagnosis present

## 2021-12-16 DIAGNOSIS — J449 Chronic obstructive pulmonary disease, unspecified: Secondary | ICD-10-CM | POA: Diagnosis present

## 2021-12-16 DIAGNOSIS — F419 Anxiety disorder, unspecified: Secondary | ICD-10-CM

## 2021-12-16 DIAGNOSIS — Z8249 Family history of ischemic heart disease and other diseases of the circulatory system: Secondary | ICD-10-CM

## 2021-12-16 DIAGNOSIS — I951 Orthostatic hypotension: Secondary | ICD-10-CM | POA: Diagnosis present

## 2021-12-16 DIAGNOSIS — E785 Hyperlipidemia, unspecified: Secondary | ICD-10-CM | POA: Diagnosis present

## 2021-12-16 DIAGNOSIS — G629 Polyneuropathy, unspecified: Secondary | ICD-10-CM | POA: Diagnosis present

## 2021-12-16 DIAGNOSIS — G894 Chronic pain syndrome: Secondary | ICD-10-CM | POA: Diagnosis present

## 2021-12-16 DIAGNOSIS — J690 Pneumonitis due to inhalation of food and vomit: Secondary | ICD-10-CM | POA: Diagnosis not present

## 2021-12-16 DIAGNOSIS — R339 Retention of urine, unspecified: Secondary | ICD-10-CM | POA: Diagnosis not present

## 2021-12-16 DIAGNOSIS — S72141A Displaced intertrochanteric fracture of right femur, initial encounter for closed fracture: Secondary | ICD-10-CM | POA: Diagnosis present

## 2021-12-16 DIAGNOSIS — K5900 Constipation, unspecified: Secondary | ICD-10-CM | POA: Diagnosis present

## 2021-12-16 DIAGNOSIS — Y92000 Kitchen of unspecified non-institutional (private) residence as  the place of occurrence of the external cause: Secondary | ICD-10-CM

## 2021-12-16 DIAGNOSIS — R0902 Hypoxemia: Secondary | ICD-10-CM

## 2021-12-16 DIAGNOSIS — Z20822 Contact with and (suspected) exposure to covid-19: Secondary | ICD-10-CM | POA: Diagnosis present

## 2021-12-16 DIAGNOSIS — R7989 Other specified abnormal findings of blood chemistry: Secondary | ICD-10-CM

## 2021-12-16 DIAGNOSIS — S72001A Fracture of unspecified part of neck of right femur, initial encounter for closed fracture: Secondary | ICD-10-CM

## 2021-12-16 DIAGNOSIS — Z87891 Personal history of nicotine dependence: Secondary | ICD-10-CM

## 2021-12-16 HISTORY — DX: Depression, unspecified: F32.A

## 2021-12-16 LAB — COMPREHENSIVE METABOLIC PANEL
ALT: 14 U/L (ref 0–44)
AST: 24 U/L (ref 15–41)
Albumin: 4.2 g/dL (ref 3.5–5.0)
Alkaline Phosphatase: 100 U/L (ref 38–126)
Anion gap: 9 (ref 5–15)
BUN: 14 mg/dL (ref 8–23)
CO2: 31 mmol/L (ref 22–32)
Calcium: 8.9 mg/dL (ref 8.9–10.3)
Chloride: 98 mmol/L (ref 98–111)
Creatinine, Ser: 0.75 mg/dL (ref 0.44–1.00)
GFR, Estimated: >60 mL/min (ref >60)
Glucose, Bld: 134 mg/dL — ABNORMAL HIGH (ref 70–99)
Potassium: 4 mmol/L (ref 3.5–5.1)
Sodium: 138 mmol/L (ref 135–145)
Total Bilirubin: 0.6 mg/dL (ref 0.3–1.2)
Total Protein: 8 g/dL (ref 6.5–8.1)

## 2021-12-16 LAB — PROTIME-INR
INR: 1 (ref 0.8–1.2)
Prothrombin Time: 13.5 seconds (ref 11.4–15.2)

## 2021-12-16 LAB — D-DIMER, QUANTITATIVE: D-Dimer, Quant: 14.88 ug/mL-FEU — ABNORMAL HIGH (ref 0.00–0.50)

## 2021-12-16 LAB — RESP PANEL BY RT-PCR (FLU A&B, COVID) ARPGX2
Influenza A by PCR: NEGATIVE
Influenza B by PCR: NEGATIVE
SARS Coronavirus 2 by RT PCR: NEGATIVE

## 2021-12-16 LAB — CBC WITH DIFFERENTIAL/PLATELET
Abs Immature Granulocytes: 0.08 10*3/uL — ABNORMAL HIGH (ref 0.00–0.07)
Basophils Absolute: 0 10*3/uL (ref 0.0–0.1)
Basophils Relative: 0 %
Eosinophils Absolute: 0.2 10*3/uL (ref 0.0–0.5)
Eosinophils Relative: 1 %
HCT: 40.9 % (ref 36.0–46.0)
Hemoglobin: 13.3 g/dL (ref 12.0–15.0)
Immature Granulocytes: 1 %
Lymphocytes Relative: 7 %
Lymphs Abs: 1 10*3/uL (ref 0.7–4.0)
MCH: 31.1 pg (ref 26.0–34.0)
MCHC: 32.5 g/dL (ref 30.0–36.0)
MCV: 95.8 fL (ref 80.0–100.0)
Monocytes Absolute: 1 10*3/uL (ref 0.1–1.0)
Monocytes Relative: 7 %
Neutro Abs: 12.4 10*3/uL — ABNORMAL HIGH (ref 1.7–7.7)
Neutrophils Relative %: 84 %
Platelets: 248 10*3/uL (ref 150–400)
RBC: 4.27 MIL/uL (ref 3.87–5.11)
RDW: 12.7 % (ref 11.5–15.5)
WBC: 14.7 10*3/uL — ABNORMAL HIGH (ref 4.0–10.5)
nRBC: 0 % (ref 0.0–0.2)

## 2021-12-16 LAB — URINALYSIS, COMPLETE (UACMP) WITH MICROSCOPIC
Bilirubin Urine: NEGATIVE
Glucose, UA: NEGATIVE mg/dL
Hgb urine dipstick: NEGATIVE
Ketones, ur: NEGATIVE mg/dL
Leukocytes,Ua: NEGATIVE
Nitrite: POSITIVE — AB
Protein, ur: NEGATIVE mg/dL
Specific Gravity, Urine: 1.013 (ref 1.005–1.030)
pH: 7 (ref 5.0–8.0)

## 2021-12-16 LAB — VITAMIN B12: Vitamin B-12: 401 pg/mL (ref 180–914)

## 2021-12-16 LAB — LACTIC ACID, PLASMA
Lactic Acid, Venous: 1.2 mmol/L (ref 0.5–1.9)
Lactic Acid, Venous: 1.5 mmol/L (ref 0.5–1.9)

## 2021-12-16 LAB — CK: Total CK: 60 U/L (ref 38–234)

## 2021-12-16 LAB — PROCALCITONIN: Procalcitonin: <0.1 ng/mL

## 2021-12-16 LAB — TROPONIN I (HIGH SENSITIVITY): Troponin I (High Sensitivity): 6 ng/L (ref <18)

## 2021-12-16 MED ORDER — FENTANYL CITRATE PF 50 MCG/ML IJ SOSY
50.0000 ug | PREFILLED_SYRINGE | Freq: Once | INTRAMUSCULAR | Status: AC
  Administered 2021-12-16: 14:00:00 50 ug via INTRAVENOUS
  Filled 2021-12-16: qty 1

## 2021-12-16 MED ORDER — HYDROCODONE-ACETAMINOPHEN 5-325 MG PO TABS
1.0000 | ORAL_TABLET | Freq: Four times a day (QID) | ORAL | Status: DC | PRN
  Administered 2021-12-16 – 2021-12-17 (×2): 1 via ORAL
  Administered 2021-12-17: 2 via ORAL
  Administered 2021-12-17 – 2021-12-22 (×16): 1 via ORAL
  Administered 2021-12-23 – 2021-12-24 (×4): 2 via ORAL
  Filled 2021-12-16 (×8): qty 1
  Filled 2021-12-16 (×2): qty 2
  Filled 2021-12-16 (×2): qty 1
  Filled 2021-12-16: qty 2
  Filled 2021-12-16 (×3): qty 1
  Filled 2021-12-16: qty 2
  Filled 2021-12-16: qty 1
  Filled 2021-12-16: qty 2
  Filled 2021-12-16 (×3): qty 1
  Filled 2021-12-16: qty 2

## 2021-12-16 MED ORDER — IPRATROPIUM-ALBUTEROL 0.5-2.5 (3) MG/3ML IN SOLN
3.0000 mL | Freq: Once | RESPIRATORY_TRACT | Status: AC
  Administered 2021-12-16: 19:00:00 3 mL via RESPIRATORY_TRACT
  Filled 2021-12-16: qty 3

## 2021-12-16 MED ORDER — ZOLPIDEM TARTRATE 5 MG PO TABS
5.0000 mg | ORAL_TABLET | Freq: Every evening | ORAL | Status: DC | PRN
  Administered 2021-12-21 – 2021-12-23 (×3): 5 mg via ORAL
  Filled 2021-12-16 (×3): qty 1

## 2021-12-16 MED ORDER — HEPARIN SODIUM (PORCINE) 5000 UNIT/ML IJ SOLN
5000.0000 [IU] | Freq: Three times a day (TID) | INTRAMUSCULAR | Status: AC
  Administered 2021-12-16: 22:00:00 5000 [IU] via SUBCUTANEOUS
  Filled 2021-12-16: qty 1

## 2021-12-16 MED ORDER — LACTATED RINGERS IV SOLN: INTRAVENOUS | Status: AC

## 2021-12-16 MED ORDER — SIMVASTATIN 20 MG PO TABS
40.0000 mg | ORAL_TABLET | Freq: Every evening | ORAL | Status: DC
  Administered 2021-12-16 – 2021-12-23 (×8): 40 mg via ORAL
  Filled 2021-12-16: qty 4
  Filled 2021-12-16 (×7): qty 2

## 2021-12-16 MED ORDER — AMITRIPTYLINE HCL 25 MG PO TABS
50.0000 mg | ORAL_TABLET | Freq: Every day | ORAL | Status: DC
  Administered 2021-12-16 – 2021-12-23 (×8): 50 mg via ORAL
  Filled 2021-12-16 (×8): qty 2

## 2021-12-16 MED ORDER — FLUTICASONE PROPIONATE 50 MCG/ACT NA SUSP
2.0000 | Freq: Two times a day (BID) | NASAL | Status: AC | PRN
  Filled 2021-12-16: qty 16

## 2021-12-16 MED ORDER — GABAPENTIN 600 MG PO TABS
1200.0000 mg | ORAL_TABLET | Freq: Three times a day (TID) | ORAL | Status: DC
  Administered 2021-12-16 – 2021-12-17 (×2): 1200 mg via ORAL
  Filled 2021-12-16 (×2): qty 2

## 2021-12-16 MED ORDER — DIPHENHYDRAMINE HCL 50 MG/ML IJ SOLN
12.5000 mg | Freq: Three times a day (TID) | INTRAMUSCULAR | Status: DC | PRN
  Administered 2021-12-16 – 2021-12-17 (×2): 12.5 mg via INTRAVENOUS
  Filled 2021-12-16 (×2): qty 1

## 2021-12-16 MED ORDER — IOHEXOL 350 MG/ML SOLN
75.0000 mL | Freq: Once | INTRAVENOUS | Status: AC | PRN
  Administered 2021-12-16: 18:00:00 75 mL via INTRAVENOUS

## 2021-12-16 MED ORDER — SERTRALINE HCL 50 MG PO TABS
25.0000 mg | ORAL_TABLET | Freq: Every day | ORAL | Status: DC
  Administered 2021-12-16 – 2021-12-24 (×9): 25 mg via ORAL
  Filled 2021-12-16 (×9): qty 1

## 2021-12-16 MED ORDER — HYDRALAZINE HCL 25 MG PO TABS: 25.0000 mg | ORAL_TABLET | Freq: Four times a day (QID) | ORAL | Status: AC | PRN

## 2021-12-16 MED ORDER — CEFAZOLIN SODIUM-DEXTROSE 2-4 GM/100ML-% IV SOLN
2.0000 g | INTRAVENOUS | Status: AC
  Administered 2021-12-17: 2 g via INTRAVENOUS
  Filled 2021-12-16: qty 100

## 2021-12-16 MED ORDER — PANTOPRAZOLE SODIUM 40 MG PO TBEC
40.0000 mg | DELAYED_RELEASE_TABLET | Freq: Every day | ORAL | Status: DC
  Administered 2021-12-17 – 2021-12-24 (×8): 40 mg via ORAL
  Filled 2021-12-16 (×8): qty 1

## 2021-12-16 MED ORDER — MORPHINE SULFATE (PF) 2 MG/ML IV SOLN
0.5000 mg | INTRAVENOUS | Status: DC | PRN
  Administered 2021-12-16 – 2021-12-22 (×4): 0.5 mg via INTRAVENOUS
  Filled 2021-12-16 (×4): qty 1

## 2021-12-16 MED ORDER — ZOLPIDEM TARTRATE 5 MG PO TABS: 10.0000 mg | ORAL_TABLET | Freq: Every day | ORAL | Status: DC

## 2021-12-16 NOTE — ED Notes (Signed)
Call daughter with updates, and time of surgery! [name]

## 2021-12-16 NOTE — H&P (Addendum)
History and Physical   Alexandra [name] GLO:[id number] DOB: [birth year] DOA: 12/16/2021  PCP: Alexandra Jude, MD  Outpatient Specialists: Alexandra [name], ophthalmology Patient coming from: Home via EMS  I have personally briefly reviewed patient's old medical records in [place].  Chief Concern: Fall  HPI: Alexandra [name] is a 80 y.o. female with medical history significant for depression, anxiety, chronic pain syndrome, hyperlipidemia, insomnia, neuropathy, who presents to the emergency department for chief concerns of fall.  At bedside, patient has been able to tell me her name, age, current calendar year.   She reports that at approximately 1 AM, while she was in bed, she got up to take a home COVID test.  She reports that in the last 3 to 4 weeks she has had a cough.  She said cough is nonproductive.  However she just decided that she was going to take a COVID test at 1 AM on 12/16/2021.  She remembers going to the bedroom into the kitchen she got the test.  She does not remember anything after that.  She reports that the She knew her husband was at her side.  She does endorse that she frequently does not remember things that happened in the middle the night.  She reports that the area between the bedroom to the kitchen does have rugs however she notes that she knows her home very well and did not think that she tripped on them.  She denies any history of hip fracture.  She reports this is never happened before.  She states that the pain has improved with pain medication.  She denies any fever, vision changes, pain with swallowing, chest pain, shortness of breath, abdominal pain, dysuria, hematuria, diarrhea.  Social history: She lives at home with her spouse.  She denies current tobacco use.  She reports infrequent EtOH use.  She denies recreational drug use.  She formally worked in the [employer].  Vaccination history: She states that she is vaccinated for COVID-19, with 2  doses.  ROS: Constitutional: no weight change, no fever ENT/Mouth: no sore throat, no rhinorrhea Eyes: no eye pain, no vision changes Cardiovascular: no chest pain, no dyspnea,  no edema, no palpitations Respiratory: no cough, no sputum, no wheezing Gastrointestinal: no nausea, no vomiting, no diarrhea, no constipation Genitourinary: no urinary incontinence, no dysuria, no hematuria Musculoskeletal: no arthralgias, no myalgias Skin: no skin lesions, no pruritus, Neuro: + weakness, no loss of consciousness, no syncope Psych: no anxiety, no depression, + decrease appetite Heme/Lymph: no bruising, no bleeding  ED Course: Discussed with emergency medicine provider, patient requiring hospitalization for chief concerns of acute right proximal intratrochanteric femur fracture.  Vitals in the emergency department showed temperature of 97.8, respiration rate of 20, heart rate of 103, blood pressure of 140/72, SPO2 of 93% on room air.  Serum sodium is 138, potassium of 4, chloride of 98, bicarb 31, BUN of 14, serum creatinine of 0.75, nonfasting blood glucose 134, GFR greater than 60, WBC 14.7, hemoglobin 13.5, platelets of 248.  Procalcitonin was negative at 0.10, CK was 60, high sensitive troponin was 6, lactic acid was not elevated at 1.2.  Patient had an elevated D-dimer of 14.88.  COVID/influenza A/influenza B PCR were negative.  Assessment/Plan  Principal Problem:   Right femoral fracture (HCC) Active Problems:   Depression   Insomnia   GERD (gastroesophageal reflux disease)   Hyperlipidemia   Polypharmacy   Pulmonary nodules/lesions, multiple   Torus palatinus   Urticaria   #  Right femoral fracture presumed secondary to mechanical fall - D-dimer was elevated, CTA of the chest was done and was read as negative for pulmonary embolism - Fall precautions - Pain control with hydrocodone-acetaminophen 5-3 25, 1 to 2 tablets p.o. every 6 hours.  For moderate pain; morphine 0.5 mg IV  every 2 hours as needed for severe pain - ED provider has consulted orthopedic service, Dr. Sharlet Salina, and they anticipate taking patient to the OR in the morning - Check B12, vitamin D - N.p.o. after midnight - Admit to MedSurg, inpatient, no telemetry  # Left sphenoid sinusitis-Flonase 2 spray, each nare, twice daily as needed for allergies and rhinitis ordered  # Hyperlipidemia-simvastatin 40 mg nightly resumed  # Depression/anxiety-resumed home amitriptyline 50 mg nightly, sertraline 25 mg daily  # Insomnia-zolpidem 5 mg nightly as needed for sleep -Patient takes zolpidem 10 mg nightly, I have advised patient that I am decreasing it to 5 mg nightly  # Right lower lobe and right upper lobe nodules -Incidental finding - Discussed extensively with daughter, Alexandra [name] over the phone that patient will need a repeat CT imaging in 3 to 6 months - Daughter endorses understanding and states that she will take care of it and make sure that this does not get missed  # Urticaria at the extremity post morphine injection - Benadryl 12.5 mg every 8 hours as needed for itching and allergies  # Polypharmacy-extensive discussion with patient that her gabapentin dose of 1200 mg p.o. 3 times daily is high and puts patient at risk for falling and altered mental status - Advised patient to discuss with outpatient PCP to decrease dosing  # History of peripheral neuropathy with possible lumbar radiculopathy-gabapentin 1200 mg p.o. 3 times daily resumed  # Chronic pain syndrome-pain medications as above  # GERD-PPI  # Chronic torus palatinus-patient states she is aware and states that she is thinking about having it removed  # DVT prophylaxis-patient has increased risk of developing DVT prophylaxis given right femur fracture - I have ordered 1 dose of heparin 5000 units subcutaneous due to anticipation of surgery - Primary team to initiate pharmacologic DVT prophylaxis when appropriate  Chart reviewed.    DVT prophylaxis: Heparin 5000 units subcutaneous dose Code Status: Full code Diet: Heart healthy now, n.p.o. after midnight Family Communication: Updated daughter Alexandra [name] over the phone and sister and niece at bedside. Disposition Plan: Pending clinical course Consults called: Orthopedic service Admission status: Admit to inpatient, MedSurg floor, no telemetry  Past Medical History:  Diagnosis Date   COPD (chronic obstructive pulmonary disease) ([place])    Depression    Past Surgical History:  Procedure Laterality Date   ABDOMINAL HYSTERECTOMY  1975   COLOSTOMY  2012   repaired and reduced in 2013   intestinal rupture  2012   TAKE DOWN OF INTESTINAL FISTULA  2013   TUMOR EXCISION  2013   hospitalized and drained.    Social History:  reports that she has quit smoking. She has never used smokeless tobacco. No history on file for alcohol use and drug use.  Allergies  Allergen Reactions   Avelox [Moxifloxacin Hcl] Shortness Of Breath and Swelling   Elemental Sulfur Other (See Comments)    Reaction as a child and not sure what.    Family History  Problem Relation Age of Onset   Heart disease Mother    Heart disease Father    Family history: Family history reviewed and not pertinent.  Prior to Admission medications  Medication Sig Start Date End Date Taking? Authorizing Provider  amitriptyline (ELAVIL) 50 MG tablet Take 50 mg by mouth at bedtime.   Yes [provider]  aspirin 81 MG chewable tablet Chew 81 mg by mouth daily.   Yes [provider]  celecoxib (CELEBREX) 200 MG capsule Take 200 mg by mouth 2 (two) times daily.   Yes [provider]  cholecalciferol (VITAMIN D3) 25 MCG (1000 UNIT) tablet Take 1,000 Units by mouth daily.   Yes [provider]  gabapentin (NEURONTIN) 600 MG tablet Take 1,200 mg by mouth 3 (three) times daily.   Yes [provider]  HYDROcodone-acetaminophen (NORCO) 10-325 MG tablet Take 1 tablet by mouth 4  (four) times daily as needed for moderate pain or severe pain.   Yes [provider]  latanoprost (XALATAN) 0.005 % ophthalmic solution Place 1 drop into both eyes at bedtime.   Yes [provider]  pantoprazole (PROTONIX) 40 MG tablet Take 40 mg by mouth 2 (two) times daily.   Yes [provider]  sertraline (ZOLOFT) 25 MG tablet Take 25 mg by mouth daily.   Yes [provider]  simvastatin (ZOCOR) 40 MG tablet Take 40 mg by mouth every evening.   Yes [provider]  zolpidem (AMBIEN) 10 MG tablet Take 10 mg by mouth at bedtime.   Yes [provider]   Physical Exam: Vitals:   12/16/21 1700 12/16/21 1730 12/16/21 1815 12/16/21 1845  BP: (!) 171/82 (!) 142/80    Pulse: 91 97 (!) 103 (!) 105  Resp: 17 18 18 16   Temp:      TempSrc:      SpO2: 97% 96% 96% 96%  Weight:      Height:       Constitutional: appears age-appropriate, frail, NAD, calm, comfortable Eyes: PERRL, lids and conjunctivae normal ENMT: Mucous membranes are moist. Posterior pharynx clear of any exudate or lesions. Age-appropriate dentition. Hearing appropriate. Neck: normal, supple, no masses, no thyromegaly Respiratory: clear to auscultation bilaterally, no wheezing, no crackles. Normal respiratory effort. No accessory muscle use.  Cardiovascular: Regular rate and rhythm, no murmurs / rubs / gallops. No extremity edema. 2+ pedal pulses. No carotid bruits.  Abdomen: obese abdomen, no tenderness, no masses palpated, no hepatosplenomegaly. Bowel sounds positive.  Musculoskeletal: no clubbing / cyanosis. No joint deformity upper and lower extremities. Good ROM, no contractures, no atrophy. Normal muscle tone.  Right femur fracture with pain, worse with movement Skin: no rashes, lesions, ulcers. No induration Neurologic: Sensation intact. Strength 5/5 in all 4.  Psychiatric: Normal judgment and insight. Alert and oriented x 3. Normal mood.   EKG: ordered  Chest x-ray on  Admission: I personally reviewed and I agree with radiologist reading as below.  DG Chest 2 View  Result Date: 12/16/2021 CLINICAL DATA:  Pain after fall EXAM: CHEST - 2 VIEW COMPARISON:  Chest radiograph 05/27/2021 FINDINGS: The cardiomediastinal silhouette is stable. The lungs are hyperlucent suggesting underlying COPD. There is no focal consolidation or pulmonary edema. There is no pleural effusion or pneumothorax. There is no displaced fracture. IMPRESSION: No radiographic evidence of acute traumatic injury in the chest. Electronically Signed   By: Valetta Mole M.D.   On: 12/16/2021 10:19   CT Head Wo Contrast  Result Date: 12/16/2021 CLINICAL DATA:  Fall EXAM: CT HEAD WITHOUT CONTRAST TECHNIQUE: Contiguous axial images were obtained from the base of the skull through the vertex without intravenous contrast. COMPARISON:  CT head 07/14/2011 FINDINGS:  Brain: There is no evidence of acute intracranial hemorrhage, extra-axial fluid collection, or acute infarct. Parenchymal volume is normal. The ventricles are normal in size. There is no mass lesion. There is no midline shift. Vascular: There is calcification of the bilateral cavernous ICAs. Skull: Normal. Negative for fracture or focal lesion. Sinuses/Orbits: There is mucosal thickening with layering fluid in the left sphenoid sinus. Imaged globes and orbits are unremarkable. Other: None. IMPRESSION: 1. No acute intracranial pathology. 2. Layering fluid in the left sphenoid sinus which can be seen with acute sinusitis in the correct clinical setting. Electronically Signed   By: Valetta Mole M.D.   On: 12/16/2021 10:32   CT Angio Chest PE W and/or Wo Contrast  Result Date: 12/16/2021 CLINICAL DATA:  [place] last night. Patient does not remember why. Decreased oxygen saturation. Cough for 2-4 weeks. EXAM: CT ANGIOGRAPHY CHEST WITH CONTRAST TECHNIQUE: Multidetector CT imaging of the chest was performed using the standard protocol during bolus administration  of intravenous contrast. Multiplanar CT image reconstructions and MIPs were obtained to evaluate the vascular anatomy. CONTRAST:  78mL OMNIPAQUE IOHEXOL 350 MG/ML SOLN COMPARISON:  Current chest radiograph.  Prior chest CT, 07/12/2011. FINDINGS: Cardiovascular: Pulmonary arteries are well opacified. There is no evidence of a pulmonary embolism. Heart is normal in size. No pericardial effusion. Minor left coronary artery calcifications. Great vessels are normal in caliber. Aortic atherosclerosis. No dissection or significant stenosis. Branch vessels are widely patent. Mediastinum/Nodes: No neck base, mediastinal or hilar masses. Prominent inferior right hilar lymph node measuring 1.1 cm short axis. No other adenopathy. Trachea and esophagus are unremarkable. Lungs/Pleura: Irregular nodule, posterior right upper lobe, 11 x 9 mm transversely, image 28, series 6, mean 1 cm. 5 mm nodule, abuts the posterior pleura, right lower lobe, image 59, series 6. There are heterogeneous areas interstitial prominence and linear/reticular type opacities consistent with scarring. Bronchial wall thickening is noted in the lower lungs, most evident in the right lower lobe. Peripheral opacities noted at the right lung base consistent with atelectasis. No convincing pneumonia. No evidence of pulmonary edema. No pleural effusion or pneumothorax. Upper Abdomen: No acute abnormality. Musculoskeletal: No fracture. No bone lesion. Review of the MIP images confirms the above findings. IMPRESSION: 1. No evidence of a pulmonary embolism. 2. Bronchial wall thickening, most evident in the right lower lobe, consistent with acute and/or chronic bronchitis. No evidence of pneumonia or pulmonary edema. 3. 1 cm irregular nodule in the right upper lobe, with 5 mm nodule noted in the posterior right lower lobe. Non-contrast chest CT at 3-6 months is recommended. If the nodules are stable at time of repeat CT, then future CT at 18-24 months (from today's  scan) is considered optional for low-risk patients, but is recommended for high-risk patients. This recommendation follows the consensus statement: Guidelines for Management of Incidental Pulmonary Nodules Detected on CT Images: From the Fleischner Society 2017; Radiology 2017; 284:228-243. Aortic Atherosclerosis (ICD10-I70.0). Electronically Signed   By: Lajean Manes M.D.   On: 12/16/2021 18:02   CT Cervical Spine Wo Contrast  Result Date: 12/16/2021 CLINICAL DATA:  Fall, trauma EXAM: CT CERVICAL SPINE WITHOUT CONTRAST TECHNIQUE: Multidetector CT imaging of the cervical spine was performed without intravenous contrast. Multiplanar CT image reconstructions were also generated. COMPARISON:  None. FINDINGS: Alignment: There is stepwise grade 1 anterolisthesis of C2 on C3 through C4 on C5, and trace retrolisthesis of C5 on C6 and C6 on C7, all likely degenerative in nature. There is no jumped or perched facets  or other evidence of traumatic malalignment. Skull base and vertebrae: Skull base alignment is maintained. Vertebral body heights are preserved. There is no evidence of acute fracture. Soft tissues and spinal canal: No prevertebral fluid or swelling. No visible canal hematoma. Disc levels: There is multilevel intervertebral disc space narrowing with associated degenerative endplate change and facet arthropathy. Facet arthropathy is most advanced on the left at C3-C4, while the disc space narrowing and degenerative endplate changes most advanced at C5-C6 and C6-C7. A posterior disc osteophyte complex at C5-C6 results in at least mild spinal canal stenosis. There is multilevel neural foraminal stenosis, most advanced on the left at C3-C4 and bilaterally at C5-C6 and C6-C7. Upper chest: Partially imaged lung apices are clear. Other: None. IMPRESSION: 1. No acute fracture or traumatic malalignment of the cervical spine. 2. Multilevel spondylolisthesis as detailed above, likely degenerative in nature. 3.  Additional multilevel degenerative changes throughout the cervical spine as detailed above, most advanced at C3-C4 and C5-C6. Electronically Signed   By: Valetta Mole M.D.   On: 12/16/2021 10:37   DG Femur Min 2 Views Right  Result Date: 12/16/2021 CLINICAL DATA:  Pain after fall EXAM: RIGHT FEMUR 2 VIEWS COMPARISON:  None. FINDINGS: Displaced intertrochanteric fracture of the proximal right femur. Soft tissues are unremarkable. IMPRESSION: Displaced intertrochanteric fracture of the proximal right femur. Electronically Signed   By: Yetta Glassman M.D.   On: 12/16/2021 10:20    Labs on Admission: I have personally reviewed following labs  CBC: Recent Labs  Lab 12/16/21 0942  WBC 14.7*  NEUTROABS 12.4*  HGB 13.3  HCT 40.9  MCV 95.8  PLT 008   Basic Metabolic Panel: Recent Labs  Lab 12/16/21 0942  NA 138  K 4.0  CL 98  CO2 31  GLUCOSE 134*  BUN 14  CREATININE 0.75  CALCIUM 8.9   GFR: Estimated Creatinine Clearance: 60.6 mL/min (by C-G formula based on SCr of 0.75 mg/dL).  Liver Function Tests: Recent Labs  Lab 12/16/21 0942  AST 24  ALT 14  ALKPHOS 100  BILITOT 0.6  PROT 8.0  ALBUMIN 4.2   Coagulation Profile: Recent Labs  Lab 12/16/21 1414  INR 1.0   Cardiac Enzymes: Recent Labs  Lab 12/16/21 1414  CKTOTAL 60   Urine analysis:    Component Value Date/Time   COLORURINE YELLOW (A) 12/16/2021 1414   APPEARANCEUR HAZY (A) 12/16/2021 1414   APPEARANCEUR Clear 01/20/2012 1203   LABSPEC 1.013 12/16/2021 1414   LABSPEC 1.004 01/20/2012 1203   PHURINE 7.0 12/16/2021 1414   GLUCOSEU NEGATIVE 12/16/2021 1414   GLUCOSEU Negative 01/20/2012 1203   HGBUR NEGATIVE 12/16/2021 1414   BILIRUBINUR NEGATIVE 12/16/2021 1414   BILIRUBINUR Negative 01/20/2012 [place] 12/16/2021 1414   PROTEINUR NEGATIVE 12/16/2021 1414   NITRITE POSITIVE (A) 12/16/2021 1414   LEUKOCYTESUR NEGATIVE 12/16/2021 1414   LEUKOCYTESUR Negative 01/20/2012 1203   Dr.  Tobie Poet [facility]  If 7PM-7AM, please contact overnight-coverage provider If 7AM-7PM, please contact day coverage provider [email or web address]  12/16/2021, 7:59 PM

## 2021-12-16 NOTE — ED Provider Notes (Signed)
[facility] Emergency Department Provider Note  ____________________________________________   None    (approximate)  I have reviewed the triage vital signs and the nursing notes.   HISTORY  Chief Complaint Fall and Leg Pain   HPI Alexandra [name] is a 80 y.o. female with a past medical history of COPD and remote tobacco abuse.  Chronic back pain on home chronic opioids who presents for assessment after a fall that occurred last night.  Patient states she does not remember exactly what happened but knows she fell as she was lying on the floor for several hours.  She states she only has pain in her right hip.  She is not sure if she hit her head or not but does not currently have any headache, neck pain or change in her chronic back pain.  She denies any other extremity pain or other recent falls or injuries.  She has had mild cough and congestion over the last 2 or 3 weeks but no shortness of breath, fevers, chest pain, abdominal pain, headache, earache, sore throat, or other recent falls or injuries.  She is on a daily ASA but no other blood thinners.  No other acute concerns at this time.         Past Medical History:  Diagnosis Date   COPD (chronic obstructive pulmonary disease) ([place])     There are no problems to display for this patient.   Past Surgical History:  Procedure Laterality Date   ABDOMINAL HYSTERECTOMY  1975   COLOSTOMY  2012   repaired and reduced in 2013   intestinal rupture  2012   TAKE DOWN OF INTESTINAL FISTULA  2013   TUMOR EXCISION  2013   hospitalized and drained.     Prior to Admission medications   Medication Sig Start Date End Date Taking? Authorizing Provider  amitriptyline (ELAVIL) 25 MG tablet Take 25 mg by mouth at bedtime. Patient may take 1 -2 tablets qhs    [provider]  aspirin 81 MG chewable tablet Chew 81 mg by mouth daily.    [provider]  bifidobacterium infantis (ALIGN) capsule Take 1  capsule by mouth daily.    [provider]  calcium citrate-vitamin D (CITRACAL+D) 315-200 MG-UNIT tablet Take 1 tablet by mouth daily.    [provider]  celecoxib (CELEBREX) 200 MG capsule Take 200 mg by mouth 2 (two) times daily.    [provider]  cyclobenzaprine (FLEXERIL) 10 MG tablet Take 10 mg by mouth as needed.    [provider]  dimenhyDRINATE (DRAMAMINE) 50 MG tablet Take 25 mg by mouth every 8 (eight) hours as needed.    [provider]  docusate sodium (COLACE) 100 MG capsule Take 1 tablet by mouth as needed.    [provider]  ferrous sulfate 325 (65 FE) MG tablet Take 325 mg by mouth daily with breakfast.    [provider]  gabapentin (NEURONTIN) 600 MG tablet Take 600 mg by mouth 3 (three) times daily.    [provider]  latanoprost (XALATAN) 0.005 % ophthalmic solution Place 1 drop into both eyes at bedtime.    [provider]  pantoprazole (PROTONIX) 40 MG tablet Take 40 mg by mouth 2 (two) times daily.    [provider]  simvastatin (ZOCOR) 40 MG tablet Take 1 tablet by mouth daily.    [provider]  vitamin C (ASCORBIC ACID) 500 MG tablet Take 500 mg by mouth  daily.    [provider]  zolpidem (AMBIEN) 10 MG tablet Take 10 mg by mouth at bedtime.    [provider]    Allergies Avelox [moxifloxacin hcl] and Elemental sulfur  Family History  Problem Relation Age of Onset   Heart disease Mother    Heart disease Father     Social History Social History   Tobacco Use   Smoking status: Former   Smokeless tobacco: Never   Tobacco comments:    stopped in 2012    Review of Systems  Review of Systems  Constitutional:  Negative for chills and fever.  HENT:  Positive for congestion. Negative for sore throat.   Eyes:  Negative for pain.  Respiratory:  Positive for cough. Negative for stridor.   Cardiovascular:  Negative for chest pain.   Gastrointestinal:  Negative for vomiting.  Musculoskeletal:  Positive for joint pain (R hip).  Skin:  Negative for rash.  Neurological:  Negative for seizures, loss of consciousness and headaches.  Psychiatric/Behavioral:  Negative for suicidal ideas.   All other systems reviewed and are negative.    ____________________________________________   PHYSICAL EXAM:  VITAL SIGNS: ED Triage Vitals  Enc Vitals Group     BP 12/16/21 0932 140/72     Pulse Rate 12/16/21 0932 (!) 103     Resp 12/16/21 0932 20     Temp 12/16/21 0932 97.8 F (36.6 C)     Temp Source 12/16/21 0932 Oral     SpO2 12/16/21 0932 93 %     Weight 12/16/21 0934 156 lb (70.8 kg)     Height 12/16/21 0934 5\' 9"  (1.753 m)     Head Circumference --      Peak Flow --      Pain Score 12/16/21 0933 5     Pain Loc --      Pain Edu? --      Excl. in [place]? --    Vitals:   12/16/21 1415 12/16/21 1430  BP: (!) 160/78 (!) 141/83  Pulse: 94 97  Resp: 18 17  Temp:    SpO2: 97% 97%   Physical Exam Vitals and nursing note reviewed.  Constitutional:      General: She is not in acute distress.    Appearance: She is well-developed.  HENT:     Head: Normocephalic and atraumatic.     Right Ear: External ear normal.     Left Ear: External ear normal.     Nose: Nose normal.  Eyes:     Conjunctiva/sclera: Conjunctivae normal.  Cardiovascular:     Rate and Rhythm: Normal rate and regular rhythm.     Heart sounds: No murmur heard. Pulmonary:     Effort: Pulmonary effort is normal. No respiratory distress.     Breath sounds: Normal breath sounds.  Abdominal:     Palpations: Abdomen is soft.     Tenderness: There is no abdominal tenderness.  Musculoskeletal:        General: No swelling.     Cervical back: Neck supple.  Skin:    General: Skin is warm and dry.     Capillary Refill: Capillary refill takes less than 2 seconds.  Neurological:     Mental Status: She is alert.  Psychiatric:        Mood and Affect: Mood  normal.    Patient has symmetric strength in upper extremities and left lower extremity.  She is unable to flex or extend the right hip.  She is able to move the toes on the right foot.  2+ radial and DP pulses.  There is no tenderness step-offs deformities over the C process T/L-spine.  Cranial nerves II through XII are grossly intact. ____________________________________________   LABS (all labs ordered are listed, but only abnormal results are displayed)  Labs Reviewed  COMPREHENSIVE METABOLIC PANEL - Abnormal; Notable for the following components:      Result Value   Glucose, Bld 134 (*)    All other components within normal limits  CBC WITH DIFFERENTIAL/PLATELET - Abnormal; Notable for the following components:   WBC 14.7 (*)    Neutro Abs 12.4 (*)    Abs Immature Granulocytes 0.08 (*)    All other components within normal limits  D-DIMER, QUANTITATIVE (NOT AT [facility]) - Abnormal; Notable for the following components:   D-Dimer, Quant 14.88 (*)    All other components within normal limits  RESP PANEL BY RT-PCR (FLU A&B, COVID) ARPGX2  LACTIC ACID, PLASMA  PROTIME-INR  CK  PROCALCITONIN  LACTIC ACID, PLASMA  URINALYSIS, COMPLETE (UACMP) WITH MICROSCOPIC  TROPONIN I (HIGH SENSITIVITY)   ____________________________________________  EKG   EKG remarkable sinus tachycardia with a ventricular rate of 103, normal axis, unremarkable intervals without evidence of acute ischemia or significant arrhythmia. ____________________________________________  RADIOLOGY  ED MD interpretation: CT head has no evidence of skull fracture, intracranial hemorrhage, mass-effect, edema or other clear acute process.  There is evidence of possible sinusitis.  CT C-spine shows no acute traumatic malalignment or fracture.  There are some degenerative changes noted throughout.  Chest x-ray shows no focal consolidation, effusion, edema, pneumothorax, rib fracture or other clear acute thoracic  process.  Plain film the right femur shows displaced intertrochanteric fracture of the right proximal femur.  Official radiology report(s): DG Chest 2 View  Result Date: 12/16/2021 CLINICAL DATA:  Pain after fall EXAM: CHEST - 2 VIEW COMPARISON:  Chest radiograph 05/27/2021 FINDINGS: The cardiomediastinal silhouette is stable. The lungs are hyperlucent suggesting underlying COPD. There is no focal consolidation or pulmonary edema. There is no pleural effusion or pneumothorax. There is no displaced fracture. IMPRESSION: No radiographic evidence of acute traumatic injury in the chest. Electronically Signed   By: Valetta Mole M.D.   On: 12/16/2021 10:19   CT Head Wo Contrast  Result Date: 12/16/2021 CLINICAL DATA:  Fall EXAM: CT HEAD WITHOUT CONTRAST TECHNIQUE: Contiguous axial images were obtained from the base of the skull through the vertex without intravenous contrast. COMPARISON:  CT head 07/14/2011 FINDINGS: Brain: There is no evidence of acute intracranial hemorrhage, extra-axial fluid collection, or acute infarct. Parenchymal volume is normal. The ventricles are normal in size. There is no mass lesion. There is no midline shift. Vascular: There is calcification of the bilateral cavernous ICAs. Skull: Normal. Negative for fracture or focal lesion. Sinuses/Orbits: There is mucosal thickening with layering fluid in the left sphenoid sinus. Imaged globes and orbits are unremarkable. Other: None. IMPRESSION: 1. No acute intracranial pathology. 2. Layering fluid in the left sphenoid sinus which can be seen with acute sinusitis in the correct clinical setting. Electronically Signed   By: Valetta Mole M.D.   On: 12/16/2021 10:32   CT Cervical Spine Wo Contrast  Result Date: 12/16/2021 CLINICAL DATA:  Fall, trauma EXAM: CT CERVICAL SPINE WITHOUT CONTRAST TECHNIQUE: Multidetector CT imaging of the cervical spine was performed without intravenous contrast. Multiplanar CT image reconstructions were also  generated. COMPARISON:  None. FINDINGS: Alignment: There is stepwise grade 1 anterolisthesis of  C2 on C3 through C4 on C5, and trace retrolisthesis of C5 on C6 and C6 on C7, all likely degenerative in nature. There is no jumped or perched facets or other evidence of traumatic malalignment. Skull base and vertebrae: Skull base alignment is maintained. Vertebral body heights are preserved. There is no evidence of acute fracture. Soft tissues and spinal canal: No prevertebral fluid or swelling. No visible canal hematoma. Disc levels: There is multilevel intervertebral disc space narrowing with associated degenerative endplate change and facet arthropathy. Facet arthropathy is most advanced on the left at C3-C4, while the disc space narrowing and degenerative endplate changes most advanced at C5-C6 and C6-C7. A posterior disc osteophyte complex at C5-C6 results in at least mild spinal canal stenosis. There is multilevel neural foraminal stenosis, most advanced on the left at C3-C4 and bilaterally at C5-C6 and C6-C7. Upper chest: Partially imaged lung apices are clear. Other: None. IMPRESSION: 1. No acute fracture or traumatic malalignment of the cervical spine. 2. Multilevel spondylolisthesis as detailed above, likely degenerative in nature. 3. Additional multilevel degenerative changes throughout the cervical spine as detailed above, most advanced at C3-C4 and C5-C6. Electronically Signed   By: Valetta Mole M.D.   On: 12/16/2021 10:37   DG Femur Min 2 Views Right  Result Date: 12/16/2021 CLINICAL DATA:  Pain after fall EXAM: RIGHT FEMUR 2 VIEWS COMPARISON:  None. FINDINGS: Displaced intertrochanteric fracture of the proximal right femur. Soft tissues are unremarkable. IMPRESSION: Displaced intertrochanteric fracture of the proximal right femur. Electronically Signed   By: Yetta Glassman M.D.   On: 12/16/2021 10:20    ____________________________________________   PROCEDURES  Procedure(s) performed  (including Critical Care):  .Critical Care Performed by: Lucrezia Starch, MD Authorized by: Lucrezia Starch, MD   Critical care provider statement:    Critical care time (minutes):  30   Critical care was necessary to treat or prevent imminent or life-threatening deterioration of the following conditions:  Respiratory failure   Critical care was time spent personally by me on the following activities:  Development of treatment plan with patient or surrogate, discussions with consultants, evaluation of patient's response to treatment, examination of patient, ordering and review of laboratory studies, ordering and review of radiographic studies, ordering and performing treatments and interventions, pulse oximetry, re-evaluation of patient's condition and review of old charts   ____________________________________________   INITIAL IMPRESSION / [place] / ED COURSE      Patient presents with above-stated history exam for assessment of right hip pain after a fall that occurred last night.  Patient was transported on 4 L nasal cannula as per EMS her SPO2 was 87 to 93% on room air.  Patient does not normally wear oxygen.  She has no significant respiratory distress on exam I do not hear any significant wheezing.  I did attempt to wean patient to room air although she dropped her SPO2 to 90% on 2 L she was increased back up to 3 L.  Unclear if this is related to the fall that occurred but additional differential considerations include pneumonia, otitis, obstructive airway disease exacerbation and PE.  With regard to a fall last night unclear etiology. Patient does have tenderness in the right hip and concern for contusion versus underlying fracture.  She is neurovascular intact distally and throughout the rest of the extremity since denying any other acute pain and I have a low suspicion for other significant trauma.  Given she does not recall what happened in her  age head CT and  C-spine were obtained.   CT head has no evidence of skull fracture, intracranial hemorrhage, mass-effect, edema or other clear acute process.  There is evidence of possible sinusitis.  CT C-spine shows no acute traumatic malalignment or fracture.  There are some degenerative changes noted throughout.  Chest x-ray shows no focal consolidation, effusion, edema, pneumothorax, rib fracture or other clear acute thoracic process.  Plain film the right femur shows displaced intertrochanteric fracture of the right proximal femur.  CMP shows no significant electrolyte or metabolic derangements.  CBC shows WBC count of 14.7 without evidence of acute anemia and normal platelets.  Troponin is nonelevated not consistent with ACS given relatively reassuring EKG..  Lactic acid nonelevated.  Procalcitonin is undetectable.  INR is within normal limits.  CK shows no evidence of rhabdomyolysis.  D-dimer is quite elevated at 14.8.  I discussed concern for acute hip fracture with on-call orthopedist Dr. Sharlet Salina who recommended hospital admission for evaluation of likely operative repair of patient's right hip.  Care patient signed over to assuming father approximately 1500.  Plan is to follow-up CTA and admit to medicine.   ____________________________________________   FINAL CLINICAL IMPRESSION(S) / ED DIAGNOSES  Final diagnoses:  Closed fracture of right hip, initial encounter ([place])  Acute respiratory failure with hypoxia (H[facility])    Medications  lactated ringers infusion ( Intravenous New Bag/Given 12/16/21 1427)  ipratropium-albuterol (DUONEB) 0.5-2.5 (3) MG/3ML nebulizer solution 3 mL (has no administration in time range)  fentaNYL (SUBLIMAZE) injection 50 mcg (50 mcg Intravenous Given 12/16/21 1406)     ED Discharge Orders     None        Note:  This document was prepared using Dragon voice recognition software and may include unintentional dictation errors.    Lucrezia Starch,  MD 12/16/21 [phone]

## 2021-12-16 NOTE — ED Triage Notes (Addendum)
Patient to ER via [facility] from home for c/o fall last night. Patient does not know why she fell or remember events. Now c/o pain to right thigh area. Denies any other pain. Patient was in floor most of the night. Patient alert and oriented. O2 sats were 87% for EMS (patient does not wear O2 at home, denies h/o lung conditions). EMS vitals: 156 CBG, 176/96, 100% on 4L. +Cough x2-4 weeks. O2 in triage ranges from 87-93% on RA. Placed on 2L. Patient does report hitting head, unsure of whether she had syncopal episode or not but believes she did not.

## 2021-12-16 NOTE — ED Notes (Signed)
Pt. Urinated on linens and bed, purewick not effective. Pt. Linens changed, peri care performed, pt. Changed into gown, new purwick, and brief applied. Pt. Repositioned for comfort. Denies any further need at this time. CT notified pt. Is ready for CT.

## 2021-12-17 ENCOUNTER — Inpatient Hospital Stay: Payer: PPO | Admitting: Anesthesiology

## 2021-12-17 ENCOUNTER — Inpatient Hospital Stay: Payer: PPO

## 2021-12-17 ENCOUNTER — Encounter: Payer: Self-pay | Admitting: Orthopaedic Surgery

## 2021-12-17 ENCOUNTER — Encounter
Admission: EM | Disposition: A | Discharge status: Home Health | Payer: Self-pay | Source: Home / Self Care | Attending: Internal Medicine

## 2021-12-17 DIAGNOSIS — F32A Depression, unspecified: Secondary | ICD-10-CM

## 2021-12-17 DIAGNOSIS — R918 Other nonspecific abnormal finding of lung field: Secondary | ICD-10-CM

## 2021-12-17 DIAGNOSIS — Z79899 Other long term (current) drug therapy: Secondary | ICD-10-CM

## 2021-12-17 DIAGNOSIS — M270 Developmental disorders of jaws: Secondary | ICD-10-CM

## 2021-12-17 DIAGNOSIS — G4700 Insomnia, unspecified: Secondary | ICD-10-CM

## 2021-12-17 DIAGNOSIS — K219 Gastro-esophageal reflux disease without esophagitis: Secondary | ICD-10-CM

## 2021-12-17 DIAGNOSIS — E785 Hyperlipidemia, unspecified: Secondary | ICD-10-CM

## 2021-12-17 HISTORY — PX: INTRAMEDULLARY (IM) NAIL INTERTROCHANTERIC: SHX5875

## 2021-12-17 LAB — MRSA NEXT GEN BY PCR, NASAL: MRSA by PCR Next Gen: NOT DETECTED

## 2021-12-17 LAB — VITAMIN D 25 HYDROXY (VIT D DEFICIENCY, FRACTURES): Vit D, 25-Hydroxy: 35.33 ng/mL (ref 30–100)

## 2021-12-17 SURGERY — FIXATION, FRACTURE, INTERTROCHANTERIC, WITH INTRAMEDULLARY ROD
Anesthesia: Spinal | Laterality: Right

## 2021-12-17 MED ORDER — ONDANSETRON HCL 4 MG/2ML IJ SOLN
INTRAMUSCULAR | Status: DC | PRN
  Administered 2021-12-17: 4 mg via INTRAVENOUS

## 2021-12-17 MED ORDER — CEFAZOLIN SODIUM-DEXTROSE 2-4 GM/100ML-% IV SOLN
INTRAVENOUS | Status: AC
  Filled 2021-12-17: qty 100

## 2021-12-17 MED ORDER — PROPOFOL 10 MG/ML IV BOLUS
INTRAVENOUS | Status: DC | PRN
  Administered 2021-12-17: 30 mg via INTRAVENOUS
  Administered 2021-12-17 (×2): 20 mg via INTRAVENOUS

## 2021-12-17 MED ORDER — PROPOFOL 500 MG/50ML IV EMUL
INTRAVENOUS | Status: DC | PRN
  Administered 2021-12-17: 30 ug/kg/min via INTRAVENOUS

## 2021-12-17 MED ORDER — OXYCODONE HCL 5 MG/5ML PO SOLN: 5.0000 mg | Freq: Once | ORAL | Status: AC | PRN

## 2021-12-17 MED ORDER — FENTANYL CITRATE (PF) 100 MCG/2ML IJ SOLN: 25.0000 ug | INTRAMUSCULAR | Status: DC | PRN

## 2021-12-17 MED ORDER — ENSURE SURGERY PO LIQD
237.0000 mL | Freq: Two times a day (BID) | ORAL | Status: DC
  Filled 2021-12-17: qty 237

## 2021-12-17 MED ORDER — DEXMEDETOMIDINE (PRECEDEX) IN NS 20 MCG/5ML (4 MCG/ML) IV SYRINGE
PREFILLED_SYRINGE | INTRAVENOUS | Status: DC | PRN
  Administered 2021-12-17: 8 ug via INTRAVENOUS

## 2021-12-17 MED ORDER — PROPOFOL 500 MG/50ML IV EMUL
INTRAVENOUS | Status: AC
  Filled 2021-12-17: qty 50

## 2021-12-17 MED ORDER — PHENYLEPHRINE HCL (PRESSORS) 10 MG/ML IV SOLN
INTRAVENOUS | Status: DC | PRN
  Administered 2021-12-17 (×2): 160 ug via INTRAVENOUS
  Administered 2021-12-17: 80 ug via INTRAVENOUS
  Administered 2021-12-17: 160 ug via INTRAVENOUS

## 2021-12-17 MED ORDER — CELECOXIB 200 MG PO CAPS
200.0000 mg | ORAL_CAPSULE | Freq: Two times a day (BID) | ORAL | Status: DC
  Administered 2021-12-17 – 2021-12-24 (×15): 200 mg via ORAL
  Filled 2021-12-17 (×15): qty 1

## 2021-12-17 MED ORDER — LACTATED RINGERS IV SOLN: INTRAVENOUS | Status: DC | PRN

## 2021-12-17 MED ORDER — KETAMINE HCL 50 MG/ML IJ SOLN
INTRAMUSCULAR | Status: DC | PRN
  Administered 2021-12-17: 30 mg via INTRAMUSCULAR

## 2021-12-17 MED ORDER — ONDANSETRON HCL 4 MG/2ML IJ SOLN: 4.0000 mg | Freq: Once | INTRAMUSCULAR | Status: DC | PRN

## 2021-12-17 MED ORDER — GABAPENTIN 800 MG PO TABS
800.0000 mg | ORAL_TABLET | Freq: Three times a day (TID) | ORAL | Status: DC
  Filled 2021-12-17 (×7): qty 1

## 2021-12-17 MED ORDER — OXYCODONE HCL 5 MG PO TABS
ORAL_TABLET | ORAL | Status: AC
  Filled 2021-12-17: qty 1

## 2021-12-17 MED ORDER — PHENYLEPHRINE HCL-NACL 20-0.9 MG/250ML-% IV SOLN
INTRAVENOUS | Status: DC | PRN
  Administered 2021-12-17: 20 ug/min via INTRAVENOUS

## 2021-12-17 MED ORDER — ENOXAPARIN SODIUM 40 MG/0.4ML IJ SOSY
40.0000 mg | PREFILLED_SYRINGE | INTRAMUSCULAR | Status: DC
  Administered 2021-12-18 – 2021-12-24 (×7): 40 mg via SUBCUTANEOUS
  Filled 2021-12-17 (×7): qty 0.4

## 2021-12-17 MED ORDER — FENTANYL CITRATE (PF) 100 MCG/2ML IJ SOLN
INTRAMUSCULAR | Status: DC | PRN
  Administered 2021-12-17: 50 ug via INTRAVENOUS

## 2021-12-17 MED ORDER — OXYCODONE HCL 5 MG PO TABS
5.0000 mg | ORAL_TABLET | Freq: Once | ORAL | Status: AC | PRN
  Administered 2021-12-17: 5 mg via ORAL

## 2021-12-17 MED ORDER — PHENYLEPHRINE HCL-NACL 20-0.9 MG/250ML-% IV SOLN
INTRAVENOUS | Status: AC
  Filled 2021-12-17: qty 250

## 2021-12-17 MED ORDER — FENTANYL CITRATE (PF) 100 MCG/2ML IJ SOLN
INTRAMUSCULAR | Status: AC
  Filled 2021-12-17: qty 2

## 2021-12-17 MED ORDER — DEXMEDETOMIDINE HCL IN NACL 200 MCG/50ML IV SOLN
INTRAVENOUS | Status: AC
  Filled 2021-12-17: qty 50

## 2021-12-17 MED ORDER — KETAMINE HCL 50 MG/5ML IJ SOSY
PREFILLED_SYRINGE | INTRAMUSCULAR | Status: AC
  Filled 2021-12-17: qty 5

## 2021-12-17 MED ORDER — BUPIVACAINE HCL (PF) 0.5 % IJ SOLN
INTRAMUSCULAR | Status: DC | PRN
  Administered 2021-12-17: 2.5 mL

## 2021-12-17 MED ORDER — CHLORHEXIDINE GLUCONATE CLOTH 2 % EX PADS
6.0000 | MEDICATED_PAD | Freq: Every day | CUTANEOUS | Status: DC
  Administered 2021-12-18 – 2021-12-24 (×7): 6 via TOPICAL

## 2021-12-17 SURGICAL SUPPLY — 51 items
BIT DRILL INTERTAN LAG SCREW (BIT) ×1 IMPLANT
BIT DRILL LONG 4.0 (BIT) IMPLANT
BNDG COHESIVE 6X5 TAN ST LF (GAUZE/BANDAGES/DRESSINGS) ×6 IMPLANT
CHLORAPREP W/TINT 26 (MISCELLANEOUS) ×2 IMPLANT
DRAPE 3/4 80X56 (DRAPES) ×4 IMPLANT
DRAPE C-ARM 42X72 X-RAY (DRAPES) ×2 IMPLANT
DRAPE SURG 17X11 SM STRL (DRAPES) ×4 IMPLANT
DRAPE U-SHAPE 47X51 STRL (DRAPES) ×2 IMPLANT
DRILL BIT LONG 4.0 (BIT) ×2
DRSG OPSITE POSTOP 3X4 (GAUZE/BANDAGES/DRESSINGS) ×4 IMPLANT
DRSG OPSITE POSTOP 4X14 (GAUZE/BANDAGES/DRESSINGS) IMPLANT
DRSG OPSITE POSTOP 4X6 (GAUZE/BANDAGES/DRESSINGS) ×2 IMPLANT
ELECT REM PT RETURN 9FT ADLT (ELECTROSURGICAL) ×2
ELECTRODE REM PT RTRN 9FT ADLT (ELECTROSURGICAL) ×1 IMPLANT
GAUZE 4X4 16PLY ~~LOC~~+RFID DBL (SPONGE) ×2 IMPLANT
GAUZE SPONGE 4X4 12PLY STRL (GAUZE/BANDAGES/DRESSINGS) ×2 IMPLANT
GAUZE XEROFORM 1X8 LF (GAUZE/BANDAGES/DRESSINGS) ×2 IMPLANT
GLOVE SURG ENC MOIS LTX SZ7.5 (GLOVE) ×2 IMPLANT
GLOVE SURG UNDER POLY LF SZ7.5 (GLOVE) ×2 IMPLANT
GOWN L4 XLG 20 PK N/S (GOWN DISPOSABLE) ×2 IMPLANT
GOWN STRL REUS W/ TWL LRG LVL3 (GOWN DISPOSABLE) ×1 IMPLANT
GOWN STRL REUS W/TWL LRG LVL3 (GOWN DISPOSABLE) ×1
GUIDE PIN 3.2X343 (PIN) ×2
GUIDE PIN 3.2X343MM (PIN) ×2
HEMOVAC 400CC 10FR (MISCELLANEOUS) IMPLANT
HOLDER FOLEY CATH W/STRAP (MISCELLANEOUS) ×2 IMPLANT
KIT TURNOVER CYSTO (KITS) ×2 IMPLANT
MANIFOLD NEPTUNE II (INSTRUMENTS) ×2 IMPLANT
MAT ABSORB  FLUID 56X50 GRAY (MISCELLANEOUS) ×1
MAT ABSORB FLUID 56X50 GRAY (MISCELLANEOUS) ×1 IMPLANT
NAIL TRIGEN INTERTAN 10X18CM (Nail) ×1 IMPLANT
NS IRRIG 1000ML POUR BTL (IV SOLUTION) ×2 IMPLANT
PACK HIP COMPR (MISCELLANEOUS) ×2 IMPLANT
PAD ABD DERMACEA PRESS 5X9 (GAUZE/BANDAGES/DRESSINGS) ×2 IMPLANT
PAD ARMBOARD 7.5X6 YLW CONV (MISCELLANEOUS) ×2 IMPLANT
PIN GUIDE 3.2X343MM (PIN) IMPLANT
SCREW LAG COMPR KIT 95/90 (Screw) ×1 IMPLANT
SCREW TRIGEN LOW PROF 5.0X32.5 (Screw) ×1 IMPLANT
SPONGE T-LAP 18X18 ~~LOC~~+RFID (SPONGE) ×4 IMPLANT
STAPLER SKIN PROX 35W (STAPLE) ×2 IMPLANT
SUCTION FRAZIER HANDLE 10FR (MISCELLANEOUS) ×1
SUCTION TUBE FRAZIER 10FR DISP (MISCELLANEOUS) ×1 IMPLANT
SUT VIC AB 0 CT1 36 (SUTURE) ×4 IMPLANT
SUT VIC AB 2-0 CT1 (SUTURE) ×3 IMPLANT
SUT VIC AB 2-0 CT1 27 (SUTURE) ×1
SUT VIC AB 2-0 CT1 TAPERPNT 27 (SUTURE) ×1 IMPLANT
SUT VICRYL 0 AB UR-6 (SUTURE) ×2 IMPLANT
SYR 30ML LL (SYRINGE) ×2 IMPLANT
TAPE PAPER 1/2X10 TAN MEDIPORE (MISCELLANEOUS) ×2 IMPLANT
TRAY FOLEY SLVR 16FR LF STAT (SET/KITS/TRAYS/PACK) ×2 IMPLANT
WATER STERILE IRR 500ML POUR (IV SOLUTION) ×2 IMPLANT

## 2021-12-17 NOTE — TOC CM/SW Note (Signed)
CSW acknowledges consult for SNF placement. Please place PT/OT orders when appropriate. TOC will follow for recs or other needs.  Oleh Genin, [place]

## 2021-12-17 NOTE — Progress Notes (Signed)
PROGRESS NOTE    Alexandra [name]  TGG:[id number] DOB: 23-Oct-1941 DOA: 12/16/2021 PCP: Lynnell Jude, MD   Brief Narrative:  Alexandra [name] is a 80 y.o. female with medical history significant for depression, anxiety, chronic pain syndrome, hyperlipidemia, insomnia, neuropathy, who presents to the emergency department for chief concerns of fall, intractable pain, and ambulatory dysfunction. In ED imaging was remarkable for right femur fracture - ortho consulted and hospitalist called for admission.  Assessment & Plan:   Right femoral fracture presumed secondary to mechanical fall - D-dimer was elevated, CTA of the chest was done and was read as negative for pulmonary embolism - BLE US DVT study pending - Ortho consulted -plan for ORIF later today - Limit narcotics given polypharmacy as below - wean aggressively down as tolerated  Polypharmacy - Prolonged and extensive discussion with family (daughter and husband) as well as patient at bedsided - Patient has a long history of uncontrolled pain after back surger 1997 - she is on dangerously high dose of gabapentin, high dose of ambien for her age at 10mg  as well as hydrocodone and multiple medications for anxiety/depression. - This medication list is likely the reason for her fall at home - weaned [place] to 5mg  nightly, drop gabapentin to 800mg  TID (which is still 34% above recommended dose) - Recommend following up with pain specialist given poorly dosed medications  Hyperlipidemia - Continue simvastatin 40 mg nightly resumed  Depression/anxiety  -Continue home amitriptyline 50 mg, sertraline 25 mg  Insomnia -zolpidem 5 mg nightly as needed for sleep -decreased from 10mg  nightly home dose which we discussed was too high given her age - recommend weaning off entirely   Incidental right lower lobe and right upper lobe nodules -Incidental finding - Discussed extensively with daughter, Alexandra [name] over the phone that patient will need a repeat CT  imaging in 3 to 6 months - Daughter endorses understanding and states that she will take care of it and make sure that this does not get missed   Urticaria at the extremity post morphine injection - Benadryl 12.5 mg every 8 hours as needed for itching and allergies   History of peripheral neuropathy with possible lumbar radiculopathy - Decrease gabapentin to 800mg  TID   Chronic pain syndrome-pain medications as above  GERD-PPI   DVT prophylaxis: Lovenox Code Status: Full code Family Communication: Updated daughter Alexandra [name] and husband at bedside   Status is: inpt  Dispo: The patient is from: home              Anticipated d/c is to: SNF              Anticipated d/c date is: 48-72h              Patient currently NOT medically stable for discharge  Consultants:  Ortho  Procedures:  ORIF 12/31  Antimicrobials:  None   Subjective: No acute issues/events overnight  Objective: Vitals:   12/16/21 2000 12/16/21 2107 12/16/21 2331 12/17/21 0336  BP: (!) 160/85 (!) 169/79 (!) 156/52 (!) 163/77  Pulse: (!) 105 99 99 97  Resp: 17 15 14 14   Temp:  98.4 F (36.9 C) 98.4 F (36.9 C) 98.4 F (36.9 C)  TempSrc:      SpO2: 100% 96% 99% 98%  Weight:      Height:        Intake/Output Summary (Last 24 hours) at 12/17/2021 0726 Last data filed at 12/17/2021 0200 Gross per 24 hour  Intake 20 ml  Output 350 ml  Net -330 ml   Filed Weights   12/16/21 0934  Weight: 70.8 kg    Examination:  General exam: Appears calm and comfortable  Respiratory system: Clear to auscultation. Respiratory effort normal. Cardiovascular system: S1 & S2 heard, RRR. No JVD, murmurs, rubs, gallops or clicks. No pedal edema. Gastrointestinal system: Abdomen is nondistended, soft and nontender. No organomegaly or masses felt. Normal bowel sounds heard. Central nervous system: Alert and oriented. No focal neurological deficits. Extremities: Symmetric 5 x 5 power. Skin: No rashes, lesions or  ulcers Psychiatry: Judgement and insight appear normal. Mood & affect appropriate.     Data Reviewed: I have personally reviewed following labs and imaging studies  CBC: Recent Labs  Lab 12/16/21 0942  WBC 14.7*  NEUTROABS 12.4*  HGB 13.3  HCT 40.9  MCV 95.8  PLT 161   Basic Metabolic Panel: Recent Labs  Lab 12/16/21 0942  NA 138  K 4.0  CL 98  CO2 31  GLUCOSE 134*  BUN 14  CREATININE 0.75  CALCIUM 8.9   GFR: Estimated Creatinine Clearance: 60.6 mL/min (by C-G formula based on SCr of 0.75 mg/dL). Liver Function Tests: Recent Labs  Lab 12/16/21 0942  AST 24  ALT 14  ALKPHOS 100  BILITOT 0.6  PROT 8.0  ALBUMIN 4.2   No results for input(s): LIPASE, AMYLASE in the last 168 hours. No results for input(s): AMMONIA in the last 168 hours. Coagulation Profile: Recent Labs  Lab 12/16/21 1414  INR 1.0   Cardiac Enzymes: Recent Labs  Lab 12/16/21 1414  CKTOTAL 60   BNP (last 3 results) No results for input(s): PROBNP in the last 8760 hours. HbA1C: No results for input(s): HGBA1C in the last 72 hours. CBG: No results for input(s): GLUCAP in the last 168 hours. Lipid Profile: No results for input(s): CHOL, HDL, LDLCALC, TRIG, CHOLHDL, LDLDIRECT in the last 72 hours. Thyroid Function Tests: No results for input(s): TSH, T4TOTAL, FREET4, T3FREE, THYROIDAB in the last 72 hours. Anemia Panel: Recent Labs    12/16/21 1414  VITAMINB12 401   Sepsis Labs: Recent Labs  Lab 12/16/21 0960 12/16/21 1139 12/16/21 2147  PROCALCITON <0.10  --   --   LATICACIDVEN  --  1.2 1.5    Recent Results (from the past 240 hour(s))  Resp Panel by RT-PCR (Flu A&B, Covid) Nasopharyngeal Swab     Status: None   Collection Time: 12/16/21  2:14 PM   Specimen: Nasopharyngeal Swab; Nasopharyngeal(NP) swabs in vial transport medium  Result Value Ref Range Status   SARS Coronavirus 2 by RT PCR NEGATIVE NEGATIVE Final    Comment: (NOTE) SARS-CoV-2 target nucleic acids are NOT  DETECTED.  The SARS-CoV-2 RNA is generally detectable in upper respiratory specimens during the acute phase of infection. The lowest concentration of SARS-CoV-2 viral copies this assay can detect is 138 copies/mL. A negative result does not preclude SARS-Cov-2 infection and should not be used as the sole basis for treatment or other patient management decisions. A negative result may occur with  improper specimen collection/handling, submission of specimen other than nasopharyngeal swab, presence of viral mutation(s) within the areas targeted by this assay, and inadequate number of viral copies(<138 copies/mL). A negative result must be combined with clinical observations, patient history, and epidemiological information. The expected result is Negative.  Fact Sheet for Patients:  EntrepreneurPulse.com.au  Fact Sheet for Healthcare Providers:  IncredibleEmployment.be  This test is no t yet approved or cleared by the [place]  FDA and  has been authorized for detection and/or diagnosis of SARS-CoV-2 by FDA under an Emergency Use Authorization (EUA). This EUA will remain  in effect (meaning this test can be used) for the duration of the COVID-19 declaration under Section 564(b)(1) of the Act, 21 U.S.C.section 360bbb-3(b)(1), unless the authorization is terminated  or revoked sooner.       Influenza A by PCR NEGATIVE NEGATIVE Final   Influenza B by PCR NEGATIVE NEGATIVE Final    Comment: (NOTE) The Xpert Xpress SARS-CoV-2/FLU/RSV plus assay is intended as an aid in the diagnosis of influenza from Nasopharyngeal swab specimens and should not be used as a sole basis for treatment. Nasal washings and aspirates are unacceptable for Xpert Xpress SARS-CoV-2/FLU/RSV testing.  Fact Sheet for Patients: EntrepreneurPulse.com.au  Fact Sheet for Healthcare Providers: IncredibleEmployment.be  This test is not yet  approved or cleared by the [place] FDA and has been authorized for detection and/or diagnosis of SARS-CoV-2 by FDA under an Emergency Use Authorization (EUA). This EUA will remain in effect (meaning this test can be used) for the duration of the COVID-19 declaration under Section 564(b)(1) of the Act, 21 U.S.C. section 360bbb-3(b)(1), unless the authorization is terminated or revoked.  Performed at [facility], [address] [place], [place] [address]   MRSA Next Gen by PCR, Nasal     Status: None   Collection Time: 12/17/21  2:30 AM   Specimen: Nasal Mucosa; Nasal Swab  Result Value Ref Range Status   MRSA by PCR Next Gen NOT DETECTED NOT DETECTED Final    Comment: (NOTE) The GeneXpert MRSA Assay (FDA approved for NASAL specimens only), is one component of a comprehensive MRSA colonization surveillance program. It is not intended to diagnose MRSA infection nor to guide or monitor treatment for MRSA infections. Test performance is not FDA approved in patients less than 69 years old. Performed at [facility], [address], [place], [place] [address]          Radiology Studies: DG Chest 2 View  Result Date: 12/16/2021 CLINICAL DATA:  Pain after fall EXAM: CHEST - 2 VIEW COMPARISON:  Chest radiograph 05/27/2021 FINDINGS: The cardiomediastinal silhouette is stable. The lungs are hyperlucent suggesting underlying COPD. There is no focal consolidation or pulmonary edema. There is no pleural effusion or pneumothorax. There is no displaced fracture. IMPRESSION: No radiographic evidence of acute traumatic injury in the chest. Electronically Signed   By: Valetta Mole M.D.   On: 12/16/2021 10:19   CT Head Wo Contrast  Result Date: 12/16/2021 CLINICAL DATA:  Fall EXAM: CT HEAD WITHOUT CONTRAST TECHNIQUE: Contiguous axial images were obtained from the base of the skull through the vertex without intravenous contrast. COMPARISON:  CT head 07/14/2011 FINDINGS:  Brain: There is no evidence of acute intracranial hemorrhage, extra-axial fluid collection, or acute infarct. Parenchymal volume is normal. The ventricles are normal in size. There is no mass lesion. There is no midline shift. Vascular: There is calcification of the bilateral cavernous ICAs. Skull: Normal. Negative for fracture or focal lesion. Sinuses/Orbits: There is mucosal thickening with layering fluid in the left sphenoid sinus. Imaged globes and orbits are unremarkable. Other: None. IMPRESSION: 1. No acute intracranial pathology. 2. Layering fluid in the left sphenoid sinus which can be seen with acute sinusitis in the correct clinical setting. Electronically Signed   By: Valetta Mole M.D.   On: 12/16/2021 10:32   CT Angio Chest PE W and/or Wo Contrast  Result Date: 12/16/2021  CLINICAL DATA:  [place] last night. Patient does not remember why. Decreased oxygen saturation. Cough for 2-4 weeks. EXAM: CT ANGIOGRAPHY CHEST WITH CONTRAST TECHNIQUE: Multidetector CT imaging of the chest was performed using the standard protocol during bolus administration of intravenous contrast. Multiplanar CT image reconstructions and MIPs were obtained to evaluate the vascular anatomy. CONTRAST:  30mL OMNIPAQUE IOHEXOL 350 MG/ML SOLN COMPARISON:  Current chest radiograph.  Prior chest CT, 07/12/2011. FINDINGS: Cardiovascular: Pulmonary arteries are well opacified. There is no evidence of a pulmonary embolism. Heart is normal in size. No pericardial effusion. Minor left coronary artery calcifications. Great vessels are normal in caliber. Aortic atherosclerosis. No dissection or significant stenosis. Branch vessels are widely patent. Mediastinum/Nodes: No neck base, mediastinal or hilar masses. Prominent inferior right hilar lymph node measuring 1.1 cm short axis. No other adenopathy. Trachea and esophagus are unremarkable. Lungs/Pleura: Irregular nodule, posterior right upper lobe, 11 x 9 mm transversely, image 28, series 6,  mean 1 cm. 5 mm nodule, abuts the posterior pleura, right lower lobe, image 59, series 6. There are heterogeneous areas interstitial prominence and linear/reticular type opacities consistent with scarring. Bronchial wall thickening is noted in the lower lungs, most evident in the right lower lobe. Peripheral opacities noted at the right lung base consistent with atelectasis. No convincing pneumonia. No evidence of pulmonary edema. No pleural effusion or pneumothorax. Upper Abdomen: No acute abnormality. Musculoskeletal: No fracture. No bone lesion. Review of the MIP images confirms the above findings. IMPRESSION: 1. No evidence of a pulmonary embolism. 2. Bronchial wall thickening, most evident in the right lower lobe, consistent with acute and/or chronic bronchitis. No evidence of pneumonia or pulmonary edema. 3. 1 cm irregular nodule in the right upper lobe, with 5 mm nodule noted in the posterior right lower lobe. Non-contrast chest CT at 3-6 months is recommended. If the nodules are stable at time of repeat CT, then future CT at 18-24 months (from today's scan) is considered optional for low-risk patients, but is recommended for high-risk patients. This recommendation follows the consensus statement: Guidelines for Management of Incidental Pulmonary Nodules Detected on CT Images: From the Fleischner Society 2017; Radiology 2017; 284:228-243. Aortic Atherosclerosis (ICD10-I70.0). Electronically Signed   By: Lajean Manes M.D.   On: 12/16/2021 18:02   CT Cervical Spine Wo Contrast  Result Date: 12/16/2021 CLINICAL DATA:  Fall, trauma EXAM: CT CERVICAL SPINE WITHOUT CONTRAST TECHNIQUE: Multidetector CT imaging of the cervical spine was performed without intravenous contrast. Multiplanar CT image reconstructions were also generated. COMPARISON:  None. FINDINGS: Alignment: There is stepwise grade 1 anterolisthesis of C2 on C3 through C4 on C5, and trace retrolisthesis of C5 on C6 and C6 on C7, all likely  degenerative in nature. There is no jumped or perched facets or other evidence of traumatic malalignment. Skull base and vertebrae: Skull base alignment is maintained. Vertebral body heights are preserved. There is no evidence of acute fracture. Soft tissues and spinal canal: No prevertebral fluid or swelling. No visible canal hematoma. Disc levels: There is multilevel intervertebral disc space narrowing with associated degenerative endplate change and facet arthropathy. Facet arthropathy is most advanced on the left at C3-C4, while the disc space narrowing and degenerative endplate changes most advanced at C5-C6 and C6-C7. A posterior disc osteophyte complex at C5-C6 results in at least mild spinal canal stenosis. There is multilevel neural foraminal stenosis, most advanced on the left at C3-C4 and bilaterally at C5-C6 and C6-C7. Upper chest: Partially imaged lung apices are clear. Other: None.  IMPRESSION: 1. No acute fracture or traumatic malalignment of the cervical spine. 2. Multilevel spondylolisthesis as detailed above, likely degenerative in nature. 3. Additional multilevel degenerative changes throughout the cervical spine as detailed above, most advanced at C3-C4 and C5-C6. Electronically Signed   By: Valetta Mole M.D.   On: 12/16/2021 10:37   DG Femur Min 2 Views Right  Result Date: 12/16/2021 CLINICAL DATA:  Pain after fall EXAM: RIGHT FEMUR 2 VIEWS COMPARISON:  None. FINDINGS: Displaced intertrochanteric fracture of the proximal right femur. Soft tissues are unremarkable. IMPRESSION: Displaced intertrochanteric fracture of the proximal right femur. Electronically Signed   By: Yetta Glassman M.D.   On: 12/16/2021 10:20        Scheduled Meds:  amitriptyline  50 mg Oral QHS   gabapentin  1,200 mg Oral TID   pantoprazole  40 mg Oral QPC breakfast   sertraline  25 mg Oral Daily   simvastatin  40 mg Oral QPM   Continuous Infusions:   ceFAZolin (ANCEF) IV      LOS: 1 day   Time spent:  32min  Kirklin Mcduffee C Murel Shenberger, DO [facility]  If 7PM-7AM, please contact night-coverage [email or web address]  12/17/2021, 7:26 AM

## 2021-12-17 NOTE — Op Note (Signed)
DATE OF SURGERY:  12/17/2021  TIME: 3:17 PM  PATIENT NAME:  Alexandra [name]  AGE: 80 y.o.  PRE-OPERATIVE DIAGNOSIS:  Right intertrochanteric fracture  POST-OPERATIVE DIAGNOSIS:  SAME  PROCEDURE:  Right INTRAMEDULLARY (IM) NAIL INTERTROCHANTRIC  SURGEON:  Renee Harder  EBL:  563 cc  COMPLICATIONS:  none  OPERATIVE IMPLANTS: [employer] Intertan femoral nail 10 mm x 180 mm  PREOPERATIVE INDICATIONS:  Alexandra [name] is a 80 y.o. year old who fell and suffered a hip fracture. She was brought into the ER and then admitted and optimized and then elected for surgical intervention.    The risks benefits and alternatives were discussed with the patient including but not limited to the risks of nonoperative treatment, versus surgical intervention including infection, bleeding, nerve injury, malunion, nonunion, hardware prominence, hardware failure, need for hardware removal, blood clots, cardiopulmonary complications, morbidity, mortality, among others, and they were willing to proceed.    OPERATIVE PROCEDURE:  The patient was brought to the operating room and placed in the supine position.  Spinal anesthesia was administered, with a foley. She was placed on the fracture table.  Closed reduction was performed under C-arm guidance. The length of the femur was also measured using fluoroscopy. Time out was then performed after sterile prep and drape. She received preoperative antibiotics.  Incision was made proximal to the greater trochanter. A guidewire was placed in the appropriate position. Confirmation was made on AP and lateral views. The above-named nail was opened. I opened the proximal femur with a reamer. I then placed the nail by hand easily down. I did not need to ream the femur.  Once the nail was completely seated, I placed a guidepin into the femoral head into the center center position through a second incision.  I measured the length, and then reamed the lateral cortex and up  into the head. I then placed the two interlocking lag screws. Slight compression was applied. Anatomic fixation achieved. Bone quality was poor.  I then secured the proximal interlocking screws and placed a distal interlocking screw through the jig. I then removed the instruments, and took final C-arm pictures AP and lateral the entire proximal femur. Anatomic reconstruction was achieved, and the wounds were irrigated copiously and closed with Vicryl  followed by staples and dry sterile dressing. Sponge and needle count were correct.   The patient was awakened and returned to PACU in stable and satisfactory condition. There no complications and the patient tolerated the procedure well.   POSTOPERATIVE PLAN: She will be weightbearing as tolerated.   Ok to start DVT ppx POD#1 - lovenox x 14 days Dressing change by nursing staff as needed to keep dressing clean and dry Outpatient f/u in clinic in 2 weeks for staple removal and xrays  Renee Harder

## 2021-12-17 NOTE — Plan of Care (Signed)

## 2021-12-17 NOTE — Anesthesia Preprocedure Evaluation (Signed)
Anesthesia Evaluation  Patient identified by MRN, date of birth, ID band Patient awake    Reviewed: Allergy & Precautions, NPO status , Patient's Chart, lab work & pertinent test results  History of Anesthesia Complications Negative for: history of anesthetic complications  Airway Mallampati: II  TM Distance: >3 FB Neck ROM: Full    Dental  (+) Poor Dentition, Missing   Pulmonary neg sleep apnea, COPD, Patient abstained from smoking.Not current smoker, former smoker,    Pulmonary exam normal breath sounds clear to auscultation       Cardiovascular Exercise Tolerance: Good METS(-) hypertension(-) CAD and (-) Past MI negative cardio ROS  (-) dysrhythmias  Rhythm:Regular Rate:Normal - Systolic murmurs    Neuro/Psych PSYCHIATRIC DISORDERS Depression negative neurological ROS     GI/Hepatic GERD  Medicated and Controlled,(+)     (-) substance abuse  ,   Endo/Other  neg diabetes  Renal/GU negative Renal ROS     Musculoskeletal   Abdominal   Peds  Hematology   Anesthesia Other Findings Past Medical History: No date: COPD (chronic obstructive pulmonary disease) (HCC) No date: Depression  Reproductive/Obstetrics                             Anesthesia Physical Anesthesia Plan  ASA: 3  Anesthesia Plan: Spinal   Post-op Pain Management: Ofirmev IV (intra-op), Gabapentin PO (pre-op) and Celebrex PO (pre-op)   Induction: Intravenous  PONV Risk Score and Plan: 2 and Ondansetron, Dexamethasone, Propofol infusion, TIVA and Treatment may vary due to age or medical condition  Airway Management Planned: Natural Airway  Additional Equipment: None  Intra-op Plan:   Post-operative Plan:   Informed Consent: I have reviewed the patients History and Physical, chart, labs and discussed the procedure including the risks, benefits and alternatives for the proposed anesthesia with the patient or  authorized representative who has indicated his/her understanding and acceptance.       Plan Discussed with: CRNA and Surgeon  Anesthesia Plan Comments: (Discussed R/B/A of neuraxial anesthesia technique with patient: - rare risks of spinal/epidural hematoma, nerve damage, infection - Risk of PDPH - Risk of nausea and vomiting - Risk of conversion to general anesthesia and its associated risks, including sore throat, damage to lips/eyes/teeth/oropharynx, and rare risks such as cardiac and respiratory events. - Risk of allergic reactions  Discussed the role of CRNA in patient's perioperative care.  Patient voiced understanding.)        Anesthesia Quick Evaluation

## 2021-12-17 NOTE — Progress Notes (Signed)
Initial Nutrition Assessment RD working remotely.   DOCUMENTATION CODES:   Not applicable  INTERVENTION:  - diet advancement as medically feasible.  - will order Ensure Surgery BID, each supplement provides 330 kcal and 18 grams of protein.   NUTRITION DIAGNOSIS:   Increased nutrient needs related to hip fracture, post-op healing as evidenced by estimated needs.  GOAL:   Patient will meet greater than or equal to 90% of their needs  MONITOR:   Diet advancement, PO intake, Supplement acceptance, Labs, Weight trends, Skin  REASON FOR ASSESSMENT:   Consult Hip fracture protocol  ASSESSMENT:   80 y.o. female with medical history of depression, anxiety, chronic pain syndrome, [name], insomnia, and neuropathy. She presented to the ED after a fall. She reported having a cough for the past 3-4 weeks.  Heart Healthy diet ordered yesterday at 1900 and changed to NPO at midnight; no dinner meal intake documented.   She has not been seen by a [place] RD at any time in the past.  Weight yesterday was 156 lb and appears to be a stated weight. PTA the only other weight documented was 264 lb on 02/06/2017.   She is pending R hip IM nail.    Labs reviewed. Medications reviewed; 40 mg oral protonix/day.    NUTRITION - FOCUSED PHYSICAL EXAM:  RD working remotely.  Diet Order:   Diet Order             Diet NPO time specified Except for: Ice Chips, Sips with Meds  Diet effective midnight                   EDUCATION NEEDS:   No education needs have been identified at this time  Skin:  Skin Assessment: Reviewed RN Assessment  Last BM:  PTA/unknown  Height:   Ht Readings from Last 1 Encounters:  12/16/21 5\' 9"  (1.753 m)    Weight:   Wt Readings from Last 1 Encounters:  12/16/21 70.8 kg     Estimated Nutritional Needs:  Kcal:  1800-2000 kcal Protein:  85-100 grams Fluid:  >/= 2 L/day      Jarome Matin, MS, RD, LDN, CNSC Inpatient Clinical  Dietitian RD pager # available in AMION  After hours/weekend pager # available in [facility]

## 2021-12-17 NOTE — Consult Note (Signed)
ORTHOPAEDIC CONSULTATION  REQUESTING PHYSICIAN: Little Ishikawa, MD  Chief Complaint: Right hip pain  HPI: Alexandra [name] is a 80 y.o. female who complains of right hip pain after mechanical fall at home. The pain is sharp in character. The pain is worse with movement and better with rest. Denies any numbness, tingling or constitutional symptoms.  X-rays in the ER showed the presence of an intertrochanteric right femur fracture.  Orthopedics was consulted regarding further management.  Past medical history is notable for  depression, anxiety, chronic pain syndrome, hyperlipidemia, insomnia, neuropathy.  Patient is not on any anticoagulation and does not use any assistive ambulatory devices.  Patient lives at home with her husband.   Past Medical History:  Diagnosis Date   COPD (chronic obstructive pulmonary disease) ([place])    Depression    Past Surgical History:  Procedure Laterality Date   ABDOMINAL HYSTERECTOMY  1975   COLOSTOMY  2012   repaired and reduced in 2013   intestinal rupture  2012   TAKE DOWN OF INTESTINAL FISTULA  2013   TUMOR EXCISION  2013   hospitalized and drained.    Social History   Socioeconomic History   Marital status: Married    Spouse name: Not on file   Number of children: Not on file   Years of education: Not on file   Highest education level: Not on file  Occupational History   Not on file  Tobacco Use   Smoking status: Former   Smokeless tobacco: Never   Tobacco comments:    stopped in 2012  Substance and Sexual Activity   Alcohol use: Not on file   Drug use: Not on file   Sexual activity: Not on file  Other Topics Concern   Not on file  Social History Narrative   Not on file   Social Determinants of Health   Financial Resource Strain: Not on file  Food Insecurity: Not on file  Transportation Needs: Not on file  Physical Activity: Not on file  Stress: Not on file  Social Connections: Not on file   Family History  Problem  Relation Age of Onset   Heart disease Mother    Heart disease Father    Allergies  Allergen Reactions   Avelox [Moxifloxacin Hcl] Shortness Of Breath and Swelling   Elemental Sulfur Other (See Comments)    Reaction as a child and not sure what.    Prior to Admission medications   Medication Sig Start Date End Date Taking? Authorizing Provider  amitriptyline (ELAVIL) 50 MG tablet Take 50 mg by mouth at bedtime.   Yes [provider]  aspirin 81 MG chewable tablet Chew 81 mg by mouth daily.   Yes [provider]  celecoxib (CELEBREX) 200 MG capsule Take 200 mg by mouth 2 (two) times daily.   Yes [provider]  cholecalciferol (VITAMIN D3) 25 MCG (1000 UNIT) tablet Take 1,000 Units by mouth daily.   Yes [provider]  gabapentin (NEURONTIN) 600 MG tablet Take 1,200 mg by mouth 3 (three) times daily.   Yes [provider]  HYDROcodone-acetaminophen (NORCO) 10-325 MG tablet Take 1 tablet by mouth 4 (four) times daily as needed for moderate pain or severe pain.   Yes [provider]  latanoprost (XALATAN) 0.005 % ophthalmic solution Place 1 drop into both eyes at bedtime.   Yes [provider]  pantoprazole (PROTONIX) 40 MG tablet Take 40 mg by mouth 2 (two) times daily.   Yes  [provider]  sertraline (ZOLOFT) 25 MG tablet Take 25 mg by mouth daily.   Yes [provider]  simvastatin (ZOCOR) 40 MG tablet Take 40 mg by mouth every evening.   Yes [provider]  zolpidem (AMBIEN) 10 MG tablet Take 10 mg by mouth at bedtime.   Yes [provider]   DG Chest 2 View  Result Date: 12/16/2021 CLINICAL DATA:  Pain after fall EXAM: CHEST - 2 VIEW COMPARISON:  Chest radiograph 05/27/2021 FINDINGS: The cardiomediastinal silhouette is stable. The lungs are hyperlucent suggesting underlying COPD. There is no focal consolidation or pulmonary edema. There is no pleural effusion or pneumothorax. There is  no displaced fracture. IMPRESSION: No radiographic evidence of acute traumatic injury in the chest. Electronically Signed   By: Valetta Mole M.D.   On: 12/16/2021 10:19   CT Head Wo Contrast  Result Date: 12/16/2021 CLINICAL DATA:  Fall EXAM: CT HEAD WITHOUT CONTRAST TECHNIQUE: Contiguous axial images were obtained from the base of the skull through the vertex without intravenous contrast. COMPARISON:  CT head 07/14/2011 FINDINGS: Brain: There is no evidence of acute intracranial hemorrhage, extra-axial fluid collection, or acute infarct. Parenchymal volume is normal. The ventricles are normal in size. There is no mass lesion. There is no midline shift. Vascular: There is calcification of the bilateral cavernous ICAs. Skull: Normal. Negative for fracture or focal lesion. Sinuses/Orbits: There is mucosal thickening with layering fluid in the left sphenoid sinus. Imaged globes and orbits are unremarkable. Other: None. IMPRESSION: 1. No acute intracranial pathology. 2. Layering fluid in the left sphenoid sinus which can be seen with acute sinusitis in the correct clinical setting. Electronically Signed   By: Valetta Mole M.D.   On: 12/16/2021 10:32   CT Angio Chest PE W and/or Wo Contrast  Result Date: 12/16/2021 CLINICAL DATA:  [place] last night. Patient does not remember why. Decreased oxygen saturation. Cough for 2-4 weeks. EXAM: CT ANGIOGRAPHY CHEST WITH CONTRAST TECHNIQUE: Multidetector CT imaging of the chest was performed using the standard protocol during bolus administration of intravenous contrast. Multiplanar CT image reconstructions and MIPs were obtained to evaluate the vascular anatomy. CONTRAST:  15mL OMNIPAQUE IOHEXOL 350 MG/ML SOLN COMPARISON:  Current chest radiograph.  Prior chest CT, 07/12/2011. FINDINGS: Cardiovascular: Pulmonary arteries are well opacified. There is no evidence of a pulmonary embolism. Heart is normal in size. No pericardial effusion. Minor left coronary artery  calcifications. Great vessels are normal in caliber. Aortic atherosclerosis. No dissection or significant stenosis. Branch vessels are widely patent. Mediastinum/Nodes: No neck base, mediastinal or hilar masses. Prominent inferior right hilar lymph node measuring 1.1 cm short axis. No other adenopathy. Trachea and esophagus are unremarkable. Lungs/Pleura: Irregular nodule, posterior right upper lobe, 11 x 9 mm transversely, image 28, series 6, mean 1 cm. 5 mm nodule, abuts the posterior pleura, right lower lobe, image 59, series 6. There are heterogeneous areas interstitial prominence and linear/reticular type opacities consistent with scarring. Bronchial wall thickening is noted in the lower lungs, most evident in the right lower lobe. Peripheral opacities noted at the right lung base consistent with atelectasis. No convincing pneumonia. No evidence of pulmonary edema. No pleural effusion or pneumothorax. Upper Abdomen: No acute abnormality. Musculoskeletal: No fracture. No bone lesion. Review of the MIP images confirms the above findings. IMPRESSION: 1. No evidence of a pulmonary embolism. 2. Bronchial wall thickening, most evident in the right lower lobe, consistent with acute and/or chronic bronchitis. No evidence of pneumonia or pulmonary edema.  3. 1 cm irregular nodule in the right upper lobe, with 5 mm nodule noted in the posterior right lower lobe. Non-contrast chest CT at 3-6 months is recommended. If the nodules are stable at time of repeat CT, then future CT at 18-24 months (from today's scan) is considered optional for low-risk patients, but is recommended for high-risk patients. This recommendation follows the consensus statement: Guidelines for Management of Incidental Pulmonary Nodules Detected on CT Images: From the Fleischner Society 2017; Radiology 2017; 284:228-243. Aortic Atherosclerosis (ICD10-I70.0). Electronically Signed   By: Lajean Manes M.D.   On: 12/16/2021 18:02   CT Cervical Spine Wo  Contrast  Result Date: 12/16/2021 CLINICAL DATA:  Fall, trauma EXAM: CT CERVICAL SPINE WITHOUT CONTRAST TECHNIQUE: Multidetector CT imaging of the cervical spine was performed without intravenous contrast. Multiplanar CT image reconstructions were also generated. COMPARISON:  None. FINDINGS: Alignment: There is stepwise grade 1 anterolisthesis of C2 on C3 through C4 on C5, and trace retrolisthesis of C5 on C6 and C6 on C7, all likely degenerative in nature. There is no jumped or perched facets or other evidence of traumatic malalignment. Skull base and vertebrae: Skull base alignment is maintained. Vertebral body heights are preserved. There is no evidence of acute fracture. Soft tissues and spinal canal: No prevertebral fluid or swelling. No visible canal hematoma. Disc levels: There is multilevel intervertebral disc space narrowing with associated degenerative endplate change and facet arthropathy. Facet arthropathy is most advanced on the left at C3-C4, while the disc space narrowing and degenerative endplate changes most advanced at C5-C6 and C6-C7. A posterior disc osteophyte complex at C5-C6 results in at least mild spinal canal stenosis. There is multilevel neural foraminal stenosis, most advanced on the left at C3-C4 and bilaterally at C5-C6 and C6-C7. Upper chest: Partially imaged lung apices are clear. Other: None. IMPRESSION: 1. No acute fracture or traumatic malalignment of the cervical spine. 2. Multilevel spondylolisthesis as detailed above, likely degenerative in nature. 3. Additional multilevel degenerative changes throughout the cervical spine as detailed above, most advanced at C3-C4 and C5-C6. Electronically Signed   By: Valetta Mole M.D.   On: 12/16/2021 10:37   DG Femur Min 2 Views Right  Result Date: 12/16/2021 CLINICAL DATA:  Pain after fall EXAM: RIGHT FEMUR 2 VIEWS COMPARISON:  None. FINDINGS: Displaced intertrochanteric fracture of the proximal right femur. Soft tissues are  unremarkable. IMPRESSION: Displaced intertrochanteric fracture of the proximal right femur. Electronically Signed   By: Yetta Glassman M.D.   On: 12/16/2021 10:20    Positive ROS: All other systems have been reviewed and were otherwise negative with the exception of those mentioned in the HPI and as above.  Physical Exam: General: Alert, no acute distress Cardiovascular: No pedal edema Respiratory: No cyanosis, no use of accessory musculature GI: No organomegaly, abdomen is soft and non-tender Skin: No lesions in the area of chief complaint Neurologic: Sensation intact distally Psychiatric: Patient is competent for consent with normal mood and affect Lymphatic: No axillary or cervical lymphadenopathy  MUSCULOSKELETAL:  Tenderness to palpation about the right hip, no tenderness about the knee or ankle, right lower extremity is motor and sensory neurovascular intact  Assessment: 80 year old female admitted with a right intertrochanteric hip fracture  Plan: -Appreciate hospitalist team support -Long discussion was had with the patient regarding risks and benefits of both operative and nonoperative management.  Following discussion, patient expressed wishes to proceed with surgery.  Recommendation was made for right hip intramedullary nail.  Informed consent provided.  Renee Harder, MD    12/17/2021 12:18 PM

## 2021-12-17 NOTE — Anesthesia Procedure Notes (Signed)
Spinal  Patient location during procedure: OR Start time: 12/17/2021 1:47 PM End time: 12/17/2021 1:49 PM Reason for block: surgical anesthesia Staffing Performed: resident/CRNA  Anesthesiologist: Arita Miss, MD Resident/CRNA: Tollie Eth, CRNA Preanesthetic Checklist Completed: patient identified, IV checked, site marked, risks and benefits discussed, surgical consent, monitors and equipment checked, pre-op evaluation and timeout performed Spinal Block Patient position: sitting Prep: Betadine Patient monitoring: heart rate, continuous pulse ox, blood pressure and cardiac monitor Approach: midline Location: L4-5 Injection technique: single-shot Needle Needle type: [place]acre and Introducer  Needle gauge: 24 G Needle length: 9 cm Assessment Events: CSF return Additional Notes Negative paresthesia. Negative blood return. Positive free-flowing CSF. Expiration date of kit checked and confirmed. Patient tolerated procedure well, without complications.

## 2021-12-17 NOTE — Transfer of Care (Signed)
Immediate Anesthesia Transfer of Care Note  Patient: DAWN CONVERY  Procedure(s) Performed: INTRAMEDULLARY (IM) NAIL INTERTROCHANTRIC (Right)  Patient Location: PACU  Anesthesia Type:Spinal  Level of Consciousness: awake, alert  and oriented  Airway & Oxygen Therapy: Patient Spontanous Breathing and Patient connected to nasal cannula oxygen  Post-op Assessment: Report given to RN and Post -op Vital signs reviewed and stable  Post vital signs: Reviewed and stable  Last Vitals:  Vitals Value Taken Time  BP    Temp    Pulse 103 12/17/21 1522  Resp 21 12/17/21 1522  SpO2 98 % 12/17/21 1522  Vitals shown include unvalidated device data.  Last Pain:  Vitals:   12/17/21 0947  TempSrc:   PainSc: 3          Complications: No notable events documented.

## 2021-12-18 ENCOUNTER — Inpatient Hospital Stay: Payer: PPO

## 2021-12-18 DIAGNOSIS — L509 Urticaria, unspecified: Secondary | ICD-10-CM

## 2021-12-18 LAB — CBC
HCT: 32.2 % — ABNORMAL LOW (ref 36.0–46.0)
Hemoglobin: 10.7 g/dL — ABNORMAL LOW (ref 12.0–15.0)
MCH: 31.3 pg (ref 26.0–34.0)
MCHC: 33.2 g/dL (ref 30.0–36.0)
MCV: 94.2 fL (ref 80.0–100.0)
Platelets: 222 10*3/uL (ref 150–400)
RBC: 3.42 MIL/uL — ABNORMAL LOW (ref 3.87–5.11)
RDW: 13 % (ref 11.5–15.5)
WBC: 20.2 10*3/uL — ABNORMAL HIGH (ref 4.0–10.5)
nRBC: 0 % (ref 0.0–0.2)

## 2021-12-18 LAB — BASIC METABOLIC PANEL
Anion gap: 7 (ref 5–15)
BUN: 18 mg/dL (ref 8–23)
CO2: 29 mmol/L (ref 22–32)
Calcium: 8.5 mg/dL — ABNORMAL LOW (ref 8.9–10.3)
Chloride: 97 mmol/L — ABNORMAL LOW (ref 98–111)
Creatinine, Ser: 0.68 mg/dL (ref 0.44–1.00)
GFR, Estimated: >60 mL/min (ref >60)
Glucose, Bld: 130 mg/dL — ABNORMAL HIGH (ref 70–99)
Potassium: 3.5 mmol/L (ref 3.5–5.1)
Sodium: 133 mmol/L — ABNORMAL LOW (ref 135–145)

## 2021-12-18 MED ORDER — GABAPENTIN 400 MG PO CAPS
800.0000 mg | ORAL_CAPSULE | Freq: Three times a day (TID) | ORAL | Status: DC
  Administered 2021-12-18 – 2021-12-24 (×20): 800 mg via ORAL
  Filled 2021-12-18 (×22): qty 2

## 2021-12-18 NOTE — Plan of Care (Signed)
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
## 2021-12-18 NOTE — Progress Notes (Signed)
Patient off unit for procedure.

## 2021-12-18 NOTE — Evaluation (Signed)
Physical Therapy Evaluation Patient Details Name: Alexandra [name] MRN: [id number] DOB: Apr 25, 1941 Today's Date: 12/18/2021  History of Present Illness  81 y.o. female with medical history significant for depression, anxiety, chronic pain syndrome, hyperlipidemia, insomnia, neuropathy, who presents to the emergency department for chief concerns of fall. Imaging reveals displaced intertrochanteric fracture of the proximal right femur. Pt is now s/p R hip IM nail on 12/16/21.   Clinical Impression  Pt received supine in bed, agreeable to therapy. Family in room - pt asks them to step out for session. Pt reports she may need to use the restroom; OT arrived and joined session to ensure safety during transfer. Pt was independent prior to this fall, does not use or own an AD. She does have 3 STE with no railing and another 3 steps within the home she must navigate to access the rest of the house once inside the door.    She performed sup>sit with MIN A for managing RLE, STS with MOD A of 1 person. She reported dizziness upon sitting up and again once standing. Pt found to have orthostatic hypotension: SEATED BP 123/58, MAP 76, HR 112. STANDING BP 71/41, MAP 52, HR 123. RN notified and asked therapy to perform bed level toileting. Pt had difficulty throughout session with following/understanding instructions. PT/OT returned pt to supine and assisted pt onto bedpan. Therapy left room for pt privacy to encourage urination. OT to return to room to assist pt off of bedpan. Pt is generally strong however demo deficits in strength, ROM and mobility related to the RLE. Will plan to perform standing transfers and ambulation when vitals are stable. Pt would benefit from continued PT to address above deficits and return to PLOF.      Recommendations for follow up therapy are one component of a multi-disciplinary discharge planning process, led by the attending physician.  Recommendations may be updated based on patient  status, additional functional criteria and insurance authorization.  Follow Up Recommendations Skilled nursing-short term rehab (<3 hours/day)    Assistance Recommended at Discharge Frequent or constant Supervision/Assistance  Functional Status Assessment Patient has had a recent decline in their functional status and demonstrates the ability to make significant improvements in function in a reasonable and predictable amount of time.  Equipment Recommendations  Other (comment) (TBD at next venue of care)    Recommendations for Other Services       Precautions / Restrictions Precautions Precautions: Fall Restrictions Weight Bearing Restrictions: Yes RLE Weight Bearing: Weight bearing as tolerated      Mobility  Bed Mobility Overal bed mobility: Needs Assistance Bed Mobility: Supine to Sit;Sit to Supine     Supine to sit: Min assist;HOB elevated Sit to supine: Mod assist;+2 for safety/equipment;+2 for physical assistance   General bed mobility comments: MIN A to manage RLE to sit up to EOB; VC to reach for bed rail. +2 to return to bed (difficulty processing instructions).    Transfers Overall transfer level: Needs assistance Equipment used: Rolling walker (2 wheels) Transfers: Sit to/from Stand Sit to Stand: +2 safety/equipment;Mod assist           General transfer comment: MOD A of 1 to lift, second person present for safety. Pt orthostatic and returned to sitting. 2 reps. Pt did scoot EOB with close SUP.    Ambulation/Gait               General Gait Details: deferred due to orthostasis.  Stairs  Wheelchair Mobility    Modified Rankin (Stroke Patients Only)       Balance Overall balance assessment: Needs assistance Sitting-balance support: Bilateral upper extremity supported;Feet supported Sitting balance-Leahy Scale: Fair Sitting balance - Comments: Pt reporting dizziness - CGA provided. Balance improved as symptoms cleared.    Standing balance support: Bilateral upper extremity supported;Reliant on assistive device for balance Standing balance-Leahy Scale: Poor Standing balance comment: CGA and reliant on RW during stand.                             Pertinent Vitals/Pain Pain Assessment: 0-10 Pain Score: 3  Pain Location: R hip at rest Pain Descriptors / Indicators: Aching;Discomfort;Grimacing Pain Intervention(s): Limited activity within patient's tolerance;Monitored during session;Ice applied;Premedicated before session;Repositioned    Home Living Family/patient expects to be discharged to:: Private residence Living Arrangements: Spouse/significant other Available Help at Discharge: Family;Available 24 hours/day Type of Home: House Home Access: Stairs to enter Entrance Stairs-Rails: None Entrance Stairs-Number of Steps: 3   Home Layout: Other (Comment) (split level - 3 steps within home she m ust navigate from garage to remainder of house) Home Equipment: None      Prior Function Prior Level of Function : Independent/Modified Independent;History of Falls (last six months)             Mobility Comments: 1 fall in the last 6 months (femur fx resulting in this hospitalization). Does not drive - husband does.       Hand Dominance        Extremity/Trunk Assessment   Upper Extremity Assessment Upper Extremity Assessment: Overall WFL for tasks assessed    Lower Extremity Assessment Lower Extremity Assessment: RLE deficits/detail;Overall WFL for tasks assessed RLE Deficits / Details: s/p R femur IM nail       Communication   Communication: No difficulties  Cognition Arousal/Alertness: Awake/alert Behavior During Therapy: WFL for tasks assessed/performed Overall Cognitive Status: Within Functional Limits for tasks assessed                                 General Comments: Difficulty with sequencing and following commands/understanding instructions. Increased  processing time.        General Comments      Exercises     Assessment/Plan    PT Assessment Patient needs continued PT services  PT Problem List Decreased strength;Decreased mobility;Decreased safety awareness;Decreased range of motion;Decreased activity tolerance;Decreased balance       PT Treatment Interventions DME instruction;Therapeutic activities;Gait training;Therapeutic exercise;Patient/family education;Stair training;Balance training;Functional mobility training;Neuromuscular re-education    PT Goals (Current goals can be found in the Care Plan section)  Acute Rehab PT Goals Patient Stated Goal: to return to independence PT Goal Formulation: With patient Time For Goal Achievement: 01/01/22 Potential to Achieve Goals: Good    Frequency BID   Barriers to discharge Inaccessible home environment;Decreased caregiver support 3 STE + 3 steps within home. Pt worried about level of physical support husband is able to provide    Co-evaluation PT/OT/SLP Co-Evaluation/Treatment: Yes Reason for Co-Treatment: For patient/therapist safety;To address functional/ADL transfers PT goals addressed during session: Mobility/safety with mobility OT goals addressed during session: ADL's and self-care       AM-PAC PT "6 Clicks" Mobility  Outcome Measure Help needed turning from your back to your side while in a flat bed without using bedrails?: A Little Help needed moving from lying on your  back to sitting on the side of a flat bed without using bedrails?: A Little Help needed moving to and from a bed to a chair (including a wheelchair)?: A Lot Help needed standing up from a chair using your arms (e.g., wheelchair or bedside chair)?: A Lot Help needed to walk in hospital room?: A Lot Help needed climbing 3-5 steps with a railing? : Total 6 Click Score: 13    End of Session Equipment Utilized During Treatment: Gait belt;Oxygen Activity Tolerance: Patient tolerated treatment  well;Other (comment) (limited by orthostasis) Patient left: in bed;with call bell/phone within reach;with bed alarm set (on bedpan, OT to return to room in 5 minutes) Nurse Communication: Mobility status;Precautions;Other (comment);Weight bearing status (symptomatic orthostatics) PT Visit Diagnosis: Unsteadiness on feet (R26.81);Other abnormalities of gait and mobility (R26.89);Muscle weakness (generalized) (M62.81);History of falling (Z91.81);Difficulty in walking, not elsewhere classified (R26.2)    Time: 1123-1202 PT Time Calculation (min) (ACUTE ONLY): 39 min   Charges:   PT Evaluation $PT Eval Moderate Complexity: 1 Mod PT Treatments $Therapeutic Activity: 8-22 mins        Patrina Levering PT, DPT 12/18/21 1:34 PM [phone]

## 2021-12-18 NOTE — Evaluation (Signed)
Occupational Therapy Evaluation Patient Details Name: Alexandra [name] MRN: [id number] DOB: 01-13-1941 Today's Date: 12/18/2021   History of Present Illness 81 y.o. female with medical history significant for depression, anxiety, chronic pain syndrome, hyperlipidemia, insomnia, neuropathy, who presents to the emergency department for chief concerns of fall. Imaging reveals displaced intertrochanteric fracture of the proximal right femur. Pt is now s/p R hip IM nail on 12/16/21.   Clinical Impression   Ms Coombes was seen for OT evaluation this date. Prior to hospital admission, pt was Independent for mobility and ADLs. Pt lives with husband in split level home c 3 STE. Pt presents to acute OT demonstrating impaired ADL performance and functional mobility 2/2 decreased activity tolerance and functional strength/ROM/balance deficits.  Upon arrival PT at bedside with pt requesting BSC t/f. Pt requires MAX A don B socks at bed level. MID A exit R side of bed. Pt tolerates x2 standing trials MOD A + RW sit<>Stand, +2 assist 2/2 orthostatics and poor command following.   Pt reports dizziness, orthostatic positive. SpO2 desat 84% on RA with activity, resolved to 91 on 2L [place].  CGA + cues for lateral scoot ~3 ft along EOB and returned to supine. MOD A L+R rolling for bedpan placement at bed level. Pt unable to void, removed from bedpan and left with needs met/family at bedside. RN notified of orthostatics. Pt would benefit from skilled OT to address noted impairments and functional limitations (see below for any additional details) in order to maximize safety and independence while minimizing falls risk and caregiver burden. Upon hospital discharge, recommend STR to maximize pt safety and return to PLOF.  SEATED: BP 123/58, MAP 76, HR 112 STANDING: BP 71/41, MAP 52, HR 123, +dizziness     Recommendations for follow up therapy are one component of a multi-disciplinary discharge planning process, led by the attending  physician.  Recommendations may be updated based on patient status, additional functional criteria and insurance authorization.   Follow Up Recommendations  Skilled nursing-short term rehab (<3 hours/day)    Assistance Recommended at Discharge Frequent or constant Supervision/Assistance  Functional Status Assessment  Patient has had a recent decline in their functional status and demonstrates the ability to make significant improvements in function in a reasonable and predictable amount of time.  Equipment Recommendations  BSC/3in1;Other (comment) (defer to next venue of care)    Recommendations for Other Services       Precautions / Restrictions Precautions Precautions: Fall Restrictions Weight Bearing Restrictions: Yes RLE Weight Bearing: Weight bearing as tolerated      Mobility Bed Mobility Overal bed mobility: Needs Assistance Bed Mobility: Rolling;Supine to Sit;Sit to Supine Rolling: Mod assist   Supine to sit: Min assist;HOB elevated Sit to supine: Mod assist;+2 for physical assistance   General bed mobility comments: difficulty sequencing mobility    Transfers Overall transfer level: Needs assistance Equipment used: Rolling walker (2 wheels) Transfers: Sit to/from Stand;Bed to chair/wheelchair/BSC Sit to Stand: Mod assist;+2 safety/equipment          Lateral/Scoot Transfers: Min guard General transfer comment: CGA + cues for lateral scoot ~3 ft. +2 for standing trials 2/2 orthostatics and poor command following      Balance Overall balance assessment: Needs assistance Sitting-balance support: Bilateral upper extremity supported;Feet supported Sitting balance-Leahy Scale: Fair Sitting balance - Comments: Pt reporting dizziness - CGA provided. Balance improved as symptoms cleared.   Standing balance support: Bilateral upper extremity supported;Reliant on assistive device for balance Standing balance-Leahy Scale: Poor Standing  balance comment: poor  tolerance, dizziness in standing                           ADL either performed or assessed with clinical judgement   ADL Overall ADL's : Needs assistance/impaired                                       General ADL Comments: MAX A don B socks at bed level. MOD A LL+R rolling for toileting at bed level      Pertinent Vitals/Pain Pain Assessment: 0-10 Pain Score: 3  Pain Location: R hip at rest Pain Descriptors / Indicators: Aching;Discomfort;Grimacing Pain Intervention(s): Limited activity within patient's tolerance;Repositioned;RN gave pain meds during session;Premedicated before session     Hand Dominance     Extremity/Trunk Assessment Upper Extremity Assessment Upper Extremity Assessment: Overall WFL for tasks assessed   Lower Extremity Assessment Lower Extremity Assessment: RLE deficits/detail RLE Deficits / Details: s/p R femur IM nail       Communication Communication Communication: No difficulties   Cognition Arousal/Alertness: Awake/alert Behavior During Therapy: WFL for tasks assessed/performed Overall Cognitive Status: Within Functional Limits for tasks assessed                                 General Comments: Difficulty with sequencing and following commands/understanding instructions. Increased processing time.     General Comments  SEATED: BP 123/58, MAP 76, HR 112. STANDING: BP 71/41, MAP 52, HR 123    Exercises     Shoulder Instructions      Home Living Family/patient expects to be discharged to:: Private residence Living Arrangements: Spouse/significant other Available Help at Discharge: Family;Available 24 hours/day Type of Home: House Home Access: Stairs to enter [employer] of Steps: 3 Entrance Stairs-Rails: None Home Layout: Other (Comment) (split level - 3 steps within home she must navigate from garage to remainder of house)     Bathroom Shower/Tub: Radiographer, therapeutic: Handicapped height Bathroom Accessibility: No   Home Equipment: None          Prior Functioning/Environment Prior Level of Function : Independent/Modified Independent;History of Falls (last six months)             Mobility Comments: 1 fall in the last 6 months (femur fx resulting in this hospitalization). Does not drive - husband does.          OT Problem List: Decreased strength;Decreased range of motion;Decreased activity tolerance;Impaired balance (sitting and/or standing);Cardiopulmonary status limiting activity      OT Treatment/Interventions: Self-care/ADL training;Therapeutic exercise;Energy conservation;DME and/or AE instruction;Therapeutic activities;Balance training;Patient/family education    OT Goals(Current goals can be found in the care plan section) Acute Rehab OT Goals Patient Stated Goal: to feel better OT Goal Formulation: With patient Time For Goal Achievement: 01/01/22 Potential to Achieve Goals: Good ADL Goals Pt Will Perform Grooming: with modified independence;sitting (will tolerate >10 mins) Pt Will Perform Lower Body Dressing: with min assist;sitting/lateral leans Pt Will Transfer to Toilet: with min assist;stand pivot transfer;bedside commode (c LRAD PRN)  OT Frequency: Min 2X/week   Barriers to D/C: Inaccessible home environment          Co-evaluation PT/OT/SLP Co-Evaluation/Treatment: Yes Reason for Co-Treatment: For patient/therapist safety;To address functional/ADL transfers PT goals addressed during session: Mobility/safety with mobility  OT goals addressed during session: ADL's and self-care      AM-PAC OT "6 Clicks" Daily Activity     Outcome Measure Help from another person eating meals?: A Little Help from another person taking care of personal grooming?: A Little Help from another person toileting, which includes using toliet, bedpan, or urinal?: A Lot Help from another person bathing (including washing, rinsing,  drying)?: A Lot Help from another person to put on and taking off regular upper body clothing?: A Little Help from another person to put on and taking off regular lower body clothing?: A Lot 6 Click Score: 15   End of Session Equipment Utilized During Treatment: Gait belt;Rolling walker (2 wheels);Oxygen Nurse Communication: Mobility status  Activity Tolerance: Patient tolerated treatment well;Treatment limited secondary to medical complications (Comment) Patient left: in bed;with call bell/phone within reach;with bed alarm set  OT Visit Diagnosis: Other abnormalities of gait and mobility (R26.89);Muscle weakness (generalized) (M62.81)                Time: 0156-1537 OT Time Calculation (min): 37 min Charges:  OT General Charges $OT Visit: 1 Visit OT Evaluation $OT Eval Moderate Complexity: 1 Mod OT Treatments $Self Care/Home Management : 8-22 mins Dessie Coma, M.S. OTR/L  12/18/21, 2:06 PM  ascom [phone]

## 2021-12-18 NOTE — Progress Notes (Signed)
°  Subjective:  Patient is sitting comfortably in bed.  She reports moderate pain in the right hip.  Objective:   VITALS:   Vitals:   12/17/21 1701 12/17/21 1936 12/18/21 0333 12/18/21 0835  BP: (!) 147/82 (!) 152/65 134/64 (!) 142/103  Pulse: 97 (!) 104 98 94  Resp: 16 15 15 18   Temp: 97.9 F (36.6 C) 99.7 F (37.6 C) 98.5 F (36.9 C) 98.1 F (36.7 C)  TempSrc:      SpO2: 95% 95% 99% 91%  Weight:      Height:        PHYSICAL EXAM:  General: Alert, comfortable  Right lower extremity Dressings are clean and dry Neurovascular intact Sensation intact distally Intact pulses distally Dorsiflexion/Plantar flexion intact Compartment soft  LABS  Results for orders placed or performed during the hospital encounter of 12/16/21 (from the past 24 hour(s))  CBC     Status: Abnormal   Collection Time: 12/18/21  6:38 AM  Result Value Ref Range   WBC 20.2 (H) 4.0 - 10.5 K/uL   RBC 3.42 (L) 3.87 - 5.11 MIL/uL   Hemoglobin 10.7 (L) 12.0 - 15.0 g/dL   HCT 32.2 (L) 36.0 - 46.0 %   MCV 94.2 80.0 - 100.0 fL   MCH 31.3 26.0 - 34.0 pg   MCHC 33.2 30.0 - 36.0 g/dL   RDW 13.0 11.5 - 15.5 %   Platelets 222 150 - 400 K/uL   nRBC 0.0 0.0 - 0.2 %  Basic metabolic panel     Status: Abnormal   Collection Time: 12/18/21  6:38 AM  Result Value Ref Range   Sodium 133 (L) 135 - 145 mmol/L   Potassium 3.5 3.5 - 5.1 mmol/L   Chloride 97 (L) 98 - 111 mmol/L   CO2 29 22 - 32 mmol/L   Glucose, Bld 130 (H) 70 - 99 mg/dL   BUN 18 8 - 23 mg/dL   Creatinine, Ser 0.68 0.44 - 1.00 mg/dL   Calcium 8.5 (L) 8.9 - 10.3 mg/dL   GFR, Estimated >60 >60 mL/min   Anion gap 7 5 - 15     Assessment/Plan: 1 Day Post-Op  Status post right hip IM nail -Appreciate hospitalist team support/management -PT/OT: Weight-bear as tolerated right lower extremity -Okay to start DVT prophylaxis, Lovenox x14 days -Foley removal after up with PT -Patient will likely require skilled nursing facility pending PT  recommendations -Dressing changes per nursing to keep incisions clean and dry -Follow-up in clinic in 2 weeks for staple removal   Renee Harder , MD 12/18/2021, 9:10 AM

## 2021-12-19 LAB — BASIC METABOLIC PANEL WITH GFR
Anion gap: 9 (ref 5–15)
BUN: 23 mg/dL (ref 8–23)
CO2: 31 mmol/L (ref 22–32)
Calcium: 8.8 mg/dL — ABNORMAL LOW (ref 8.9–10.3)
Chloride: 97 mmol/L — ABNORMAL LOW (ref 98–111)
Creatinine, Ser: 0.79 mg/dL (ref 0.44–1.00)
GFR, Estimated: >60 mL/min
Glucose, Bld: 117 mg/dL — ABNORMAL HIGH (ref 70–99)
Potassium: 3.8 mmol/L (ref 3.5–5.1)
Sodium: 137 mmol/L (ref 135–145)

## 2021-12-19 LAB — CBC
HCT: 31.9 % — ABNORMAL LOW (ref 36.0–46.0)
Hemoglobin: 10.2 g/dL — ABNORMAL LOW (ref 12.0–15.0)
MCH: 30.4 pg (ref 26.0–34.0)
MCHC: 32 g/dL (ref 30.0–36.0)
MCV: 95.2 fL (ref 80.0–100.0)
Platelets: 255 10*3/uL (ref 150–400)
RBC: 3.35 MIL/uL — ABNORMAL LOW (ref 3.87–5.11)
RDW: 12.9 % (ref 11.5–15.5)
WBC: 20.1 10*3/uL — ABNORMAL HIGH (ref 4.0–10.5)
nRBC: 0 % (ref 0.0–0.2)

## 2021-12-19 MED ORDER — LATANOPROST 0.005 % OP SOLN
1.0000 [drp] | Freq: Every day | OPHTHALMIC | Status: DC
  Administered 2021-12-19 – 2021-12-23 (×5): 1 [drp] via OPHTHALMIC
  Filled 2021-12-19: qty 2.5

## 2021-12-19 NOTE — Progress Notes (Signed)
Physical Therapy Treatment Patient Details Name: Alexandra [name] MRN: [id number] DOB: 05-Aug-1941 Today's Date: 12/19/2021   History of Present Illness 81 y.o. female with medical history significant for depression, anxiety, chronic pain syndrome, hyperlipidemia, insomnia, neuropathy, who presents to the emergency department for chief concerns of fall. Imaging reveals displaced intertrochanteric fracture of the proximal right femur. Pt is now s/p R hip IM nail on 12/16/21.    PT Comments    Pt seen this am, received in chair on 2L O2, sats 90% at rest. Had pt stand at [place] to prepare for gait training at which time she c/o dizziness after 10 seconds with difficulty understanding verbal commands to sit back down.  Orthostatics assessed, sitting 122/60  HR 103bpm, unable to tolerate more than 30 seconds in standing with BP at 100/54, HR  115bpm.  Reviewed and completed seated exercises and proper use of Incentive spirometer.  Nursing notified of session. Will reassess after lunch.    Recommendations for follow up therapy are one component of a multi-disciplinary discharge planning process, led by the attending physician.  Recommendations may be updated based on patient status, additional functional criteria and insurance authorization.  Follow Up Recommendations  Skilled nursing-short term rehab (<3 hours/day)     Assistance Recommended at Discharge Frequent or constant Supervision/Assistance  Equipment Recommendations  Other (comment) (TBD at next venue)    Recommendations for Other Services       Precautions / Restrictions Precautions Precautions: Fall Restrictions Weight Bearing Restrictions: Yes RLE Weight Bearing: Weight bearing as tolerated     Mobility  Bed Mobility               General bed mobility comments: Pt up in chair    Transfers Overall transfer level: Needs assistance Equipment used: Rolling walker (2 wheels) Transfers: Sit to/from Stand Sit to Stand: Min  assist           General transfer comment:  (Pt required repeated vc's for hand placement and struggled to complete stand to sit once dizziness set in.)    Ambulation/Gait               General Gait Details: Pt unable to tolerate due to symptomatic orthostasis   Stairs             Wheelchair Mobility    Modified Rankin (Stroke Patients Only)       Balance                                            Cognition Arousal/Alertness: Awake/alert Behavior During Therapy: WFL for tasks assessed/performed Overall Cognitive Status: Within Functional Limits for tasks assessed                                 General Comments: Difficulty with sequencing and following commands/understanding instructions. Increased processing time. Repeated vc's to complete tasks        Exercises General Exercises - Lower Extremity Ankle Circles/Pumps: AROM;Both;15 reps Gluteal Sets: AROM;Both;10 reps Long Arc Quad: AROM;Both;10 reps    General Comments General comments (skin integrity, edema, etc.): Pt educated on proper use of incentive spirometer with use x 10.      Pertinent Vitals/Pain Pain Assessment: 0-10 Pain Score: 3  Breathing: normal Pain Location:  (R hip with ROM/wt bearing) Pain Descriptors /  Indicators: Aching;Discomfort;Grimacing Pain Intervention(s): Monitored during session    Home Living                          Prior Function            PT Goals (current goals can now be found in the care plan section) Acute Rehab PT Goals Patient Stated Goal: to return to independence    Frequency    BID      PT Plan Current plan remains appropriate    Co-evaluation              AM-PAC PT "6 Clicks" Mobility   Outcome Measure  Help needed turning from your back to your side while in a flat bed without using bedrails?: A Little Help needed moving from lying on your back to sitting on the side of a flat bed  without using bedrails?: A Little Help needed moving to and from a bed to a chair (including a wheelchair)?: A Lot Help needed standing up from a chair using your arms (e.g., wheelchair or bedside chair)?: A Lot Help needed to walk in hospital room?: A Lot Help needed climbing 3-5 steps with a railing? : Total 6 Click Score: 13    End of Session Equipment Utilized During Treatment: Gait belt;Oxygen Activity Tolerance: Patient tolerated treatment well (Limited by dizziness in standing) Patient left: in chair;with call bell/phone within reach;with family/visitor present Nurse Communication: Mobility status (vitals) PT Visit Diagnosis: Unsteadiness on feet (R26.81);Other abnormalities of gait and mobility (R26.89);Muscle weakness (generalized) (M62.81);History of falling (Z91.81);Difficulty in walking, not elsewhere classified (R26.2)     Time: 8250-5397 PT Time Calculation (min) (ACUTE ONLY): 46 min  Charges:  $Therapeutic Exercise: 8-22 mins $Therapeutic Activity: 23-37 mins                    Mikel Cella, PTA    [name] 12/19/2021, 11:39 AM

## 2021-12-19 NOTE — Anesthesia Postprocedure Evaluation (Signed)
Anesthesia Post Note  Patient: Alexandra [name]  Procedure(s) Performed: INTRAMEDULLARY (IM) NAIL INTERTROCHANTRIC (Right)  Patient location during evaluation: Nursing Unit Anesthesia Type: Spinal Level of consciousness: awake and alert Pain management: pain level controlled Vital Signs Assessment: post-procedure vital signs reviewed and stable Respiratory status: spontaneous breathing, nonlabored ventilation, respiratory function stable and patient connected to nasal cannula oxygen Cardiovascular status: blood pressure returned to baseline and stable Postop Assessment: no apparent nausea or vomiting, spinal receding and able to ambulate Anesthetic complications: no   No notable events documented.   Last Vitals:  Vitals:   12/18/21 1528 12/18/21 1951  BP: (!) 145/61 112/77  Pulse: (!) 102 94  Resp: 18 20  Temp: 37.1 C 37.1 C  SpO2: 93% 97%    Last Pain:  Vitals:   12/18/21 2325  TempSrc:   PainSc: 2                  Arita Miss

## 2021-12-19 NOTE — NC FL2 (Signed)
[place] LEVEL OF CARE SCREENING TOOL     IDENTIFICATION  Patient Name: Alexandra [name] Birthdate: [birth year] Sex: female Admission Date (Current Location): 12/16/2021  [facility] and [place] Number:  Engineering geologist and Address:  [facility], [address], [place], [place] [address]      Provider Number: [id number]  Attending Physician Name and Address:  Little Ishikawa, MD  Relative Name and Phone Number:  Corinna Lines [phone]    Current Level of Care: Hospital Recommended Level of Care: [place] Prior Approval Number:    Date Approved/Denied:   PASRR Number: [id number]  Discharge Plan: SNF    Current Diagnoses: Patient Active Problem List   Diagnosis Date Noted   Right femoral fracture ([place]) 12/16/2021   Depression 12/16/2021   Insomnia 12/16/2021   GERD (gastroesophageal reflux disease) 12/16/2021   Hyperlipidemia 12/16/2021   Polypharmacy 12/16/2021   Pulmonary nodules/lesions, multiple 12/16/2021   Torus palatinus 12/16/2021   Urticaria 12/16/2021    Orientation RESPIRATION BLADDER Height & Weight     Self, Time, Situation, Place  O2 (2 liters) Continent, External catheter Weight: 70.8 kg Height:  5\' 9"  (175.3 cm)  BEHAVIORAL SYMPTOMS/MOOD NEUROLOGICAL BOWEL NUTRITION STATUS      Continent Diet (regular)  AMBULATORY STATUS COMMUNICATION OF NEEDS Skin   Extensive Assist Verbally Normal, Surgical wounds                       Personal Care Assistance Level of Assistance  Bathing, Feeding, Dressing Bathing Assistance: Limited assistance Feeding assistance: Independent Dressing Assistance: Limited assistance     Functional Limitations Info             SPECIAL CARE FACTORS FREQUENCY  PT (By licensed PT), OT (By licensed OT)     PT Frequency: 5 times per week OT Frequency: 5 times per week            Contractures Contractures Info: Not present    Additional  Factors Info  Code Status, Allergies Code Status Info: full code Allergies Info: Avelox (Moxifloxacin Hcl), Elemental Sulfur           Current Medications (12/19/2021):  This is the current hospital active medication list Current Facility-Administered Medications  Medication Dose Route Frequency Provider Last Rate Last Admin   amitriptyline (ELAVIL) tablet 50 mg  50 mg Oral Dewain Penning, MD   50 mg at 12/18/21 2237   celecoxib (CELEBREX) capsule 200 mg  200 mg Oral BID Renee Harder, MD   200 mg at 12/19/21 4536   Chlorhexidine Gluconate Cloth 2 % PADS 6 each  6 each Topical Daily Renee Harder, MD   6 each at 12/19/21 0904   diphenhydrAMINE (BENADRYL) injection 12.5 mg  12.5 mg Intravenous Q8H PRN Renee Harder, MD   12.5 mg at 12/17/21 2015   enoxaparin (LOVENOX) injection 40 mg  40 mg Subcutaneous Q24H Renee Harder, MD   40 mg at 12/19/21 0900   feeding supplement (ENSURE SURGERY) liquid 237 mL  237 mL Oral BID BM Renee Harder, MD       fluticasone Asencion Islam) 50 MCG/ACT nasal spray 2 spray  2 spray Each Nare BID PRN Renee Harder, MD       gabapentin (NEURONTIN) capsule 800 mg  800 mg Oral TID Little Ishikawa, MD   800 mg at 12/19/21 0857   hydrALAZINE (APRESOLINE) tablet 25 mg  25 mg Oral Q6H PRN Renee Harder, MD  HYDROcodone-acetaminophen (NORCO/VICODIN) 5-325 MG per tablet 1-2 tablet  1-2 tablet Oral Q6H PRN Renee Harder, MD   1 tablet at 12/19/21 0856   latanoprost (XALATAN) 0.005 % ophthalmic solution 1 drop  1 drop Both Eyes QHS Little Ishikawa, MD       morphine 2 MG/ML injection 0.5 mg  0.5 mg Intravenous Q2H PRN Renee Harder, MD   0.5 mg at 12/18/21 2563   pantoprazole (PROTONIX) EC tablet 40 mg  40 mg Oral QPC breakfast Renee Harder, MD   40 mg at 12/19/21 0856   sertraline (ZOLOFT) tablet 25 mg  25 mg Oral Daily Renee Harder, MD   25 mg at 12/19/21 0856   simvastatin (ZOCOR) tablet 40 mg  40 mg Oral QPM  Renee Harder, MD   40 mg at 12/18/21 2240   zolpidem (AMBIEN) tablet 5 mg  5 mg Oral QHS PRN Renee Harder, MD         Discharge Medications: Please see discharge summary for a list of discharge medications.  Relevant Imaging Results:  Relevant Lab Results:   Additional Information SS# [id number]  Conception Oms, RN

## 2021-12-19 NOTE — Plan of Care (Signed)

## 2021-12-19 NOTE — TOC Progression Note (Signed)
Transition of Care [facility]) - Progression Note    Patient Details  Name: Alexandra [name] MRN: [id number] Date of Birth: 03-10-1941  Transition of [facility]) CM/SW Benjamin Perez, RN Phone Number: 12/19/2021, 10:27 AM  Clinical Narrative:   Met with the patient at the bedside to discuss DC plan and needs She lives at home with her husband She is agreeable to go to [facility] SNF Bedsearch done, PASSR completed, FL2 completed, will review bed offers once obtained         Expected Discharge Plan and Services                                                 Social Determinants of Health (SDOH) Interventions    Readmission Risk Interventions No flowsheet data found.
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
## 2021-12-19 NOTE — Progress Notes (Signed)
Physical Therapy Treatment Patient Details Name: Alexandra [name] MRN: [id number] DOB: [birth year] Today's Date: 12/19/2021   History of Present Illness 81 y.o. female with medical history significant for depression, anxiety, chronic pain syndrome, hyperlipidemia, insomnia, neuropathy, who presents to the emergency department for chief concerns of fall. Imaging reveals displaced intertrochanteric fracture of the proximal right femur. Pt is now s/p R hip IM nail on 12/16/21.    PT Comments    Pt had just returned to bed upon arrival for pm session. Agreed to B LE strengthening exercises. Was unable to tolerate mobility today due to low BP in standing. Hopefully am session will allow more functional progression. Continue PT per POC.   Recommendations for follow up therapy are one component of a multi-disciplinary discharge planning process, led by the attending physician.  Recommendations may be updated based on patient status, additional functional criteria and insurance authorization.  Follow Up Recommendations  Skilled nursing-short term rehab (<3 hours/day)     Assistance Recommended at Discharge Frequent or constant Supervision/Assistance  Equipment Recommendations  Other (comment)    Recommendations for Other Services       Precautions / Restrictions Precautions Precautions: Fall Restrictions Weight Bearing Restrictions: Yes RLE Weight Bearing: Weight bearing as tolerated     Mobility  Bed Mobility                    Transfers                        Ambulation/Gait                   Stairs             Wheelchair Mobility    Modified Rankin (Stroke Patients Only)       Balance                                            Cognition Arousal/Alertness: Awake/alert Behavior During Therapy: WFL for tasks assessed/performed Overall Cognitive Status: Within Functional Limits for tasks assessed                                           Exercises General Exercises - Lower Extremity Ankle Circles/Pumps: AROM;Both;15 reps Quad Sets: AROM;Both;10 reps Gluteal Sets: AROM;Both;10 reps Heel Slides: AROM;Both;10 reps Hip ABduction/ADduction: AROM;Both;10 reps    General Comments        Pertinent Vitals/Pain Pain Assessment: No/denies pain    Home Living                          Prior Function            PT Goals (current goals can now be found in the care plan section) Acute Rehab PT Goals Patient Stated Goal: to return to independence    Frequency    BID      PT Plan Current plan remains appropriate    Co-evaluation              AM-PAC PT "6 Clicks" Mobility   Outcome Measure  Help needed turning from your back to your side while in a flat bed without using bedrails?: A Little Help needed moving from lying on  your back to sitting on the side of a flat bed without using bedrails?: A Little Help needed moving to and from a bed to a chair (including a wheelchair)?: A Lot Help needed standing up from a chair using your arms (e.g., wheelchair or bedside chair)?: A Lot Help needed to walk in hospital room?: A Lot Help needed climbing 3-5 steps with a railing? : Total 6 Click Score: 13    End of Session Equipment Utilized During Treatment: Oxygen Activity Tolerance: Patient tolerated treatment well Patient left: in bed;with call bell/phone within reach;with bed alarm set;with family/visitor present Nurse Communication: Mobility status PT Visit Diagnosis: Unsteadiness on feet (R26.81);Other abnormalities of gait and mobility (R26.89);Muscle weakness (generalized) (M62.81);History of falling (Z91.81);Difficulty in walking, not elsewhere classified (R26.2)     Time: 0177-9390 PT Time Calculation (min) (ACUTE ONLY): 13 min  Charges:  $Therapeutic Exercise: 8-22 mins                    Mikel Cella, PTA   Alexandra [name] 12/19/2021, 4:07 PM

## 2021-12-20 ENCOUNTER — Inpatient Hospital Stay: Payer: PPO

## 2021-12-20 ENCOUNTER — Encounter: Payer: Self-pay | Admitting: Internal Medicine

## 2021-12-20 LAB — BASIC METABOLIC PANEL
Anion gap: 8 (ref 5–15)
BUN: 24 mg/dL — ABNORMAL HIGH (ref 8–23)
CO2: 33 mmol/L — ABNORMAL HIGH (ref 22–32)
Calcium: 8.6 mg/dL — ABNORMAL LOW (ref 8.9–10.3)
Chloride: 99 mmol/L (ref 98–111)
Creatinine, Ser: 0.75 mg/dL (ref 0.44–1.00)
GFR, Estimated: >60 mL/min (ref >60)
Glucose, Bld: 119 mg/dL — ABNORMAL HIGH (ref 70–99)
Potassium: 3.7 mmol/L (ref 3.5–5.1)
Sodium: 140 mmol/L (ref 135–145)

## 2021-12-20 LAB — CBC
HCT: 31.7 % — ABNORMAL LOW (ref 36.0–46.0)
Hemoglobin: 10.2 g/dL — ABNORMAL LOW (ref 12.0–15.0)
MCH: 30.4 pg (ref 26.0–34.0)
MCHC: 32.2 g/dL (ref 30.0–36.0)
MCV: 94.6 fL (ref 80.0–100.0)
Platelets: 296 10*3/uL (ref 150–400)
RBC: 3.35 MIL/uL — ABNORMAL LOW (ref 3.87–5.11)
RDW: 12.9 % (ref 11.5–15.5)
WBC: 17.9 10*3/uL — ABNORMAL HIGH (ref 4.0–10.5)
nRBC: 0 % (ref 0.0–0.2)

## 2021-12-20 LAB — D-DIMER, QUANTITATIVE: D-Dimer, Quant: 3.91 ug/mL-FEU — ABNORMAL HIGH (ref 0.00–0.50)

## 2021-12-20 MED ORDER — ADULT MULTIVITAMIN W/MINERALS CH
1.0000 | ORAL_TABLET | Freq: Every day | ORAL | Status: DC
  Administered 2021-12-20 – 2021-12-24 (×5): 1 via ORAL
  Filled 2021-12-20 (×4): qty 1

## 2021-12-20 MED ORDER — ENSURE ENLIVE PO LIQD
237.0000 mL | Freq: Two times a day (BID) | ORAL | Status: DC
  Administered 2021-12-21 – 2021-12-23 (×4): 237 mL via ORAL

## 2021-12-20 MED ORDER — IOHEXOL 350 MG/ML SOLN
75.0000 mL | Freq: Once | INTRAVENOUS | Status: AC | PRN
  Administered 2021-12-20: 75 mL via INTRAVENOUS
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
## 2021-12-20 NOTE — Progress Notes (Signed)
PT Cancellation Note  Patient Details Name: Alexandra [name] MRN: [id number] DOB: [birth year]   Cancelled Treatment:     Afternoon session held due to awaiting results of CT Scan to r/o PE. Will check back tomorrow am. Pt is independent with exercise program and comfortably resting in bed on 4L O2 with sats at 95%   [name] 12/20/2021, 2:40 PM

## 2021-12-20 NOTE — Progress Notes (Signed)
R/O PE?

## 2021-12-20 NOTE — Progress Notes (Signed)
Nutrition Follow-up  DOCUMENTATION CODES:   Not applicable  INTERVENTION:   -D/c Ensure Surgery -Ensure Enlive po TID, each supplement provides 350 kcal and 20 grams of protein  -MVI with minerals daily  NUTRITION DIAGNOSIS:   Increased nutrient needs related to hip fracture, post-op healing as evidenced by estimated needs.  Ongoing  GOAL:   Patient will meet greater than or equal to 90% of their needs  Progressing   MONITOR:   Diet advancement, PO intake, Supplement acceptance, Labs, Weight trends, Skin  REASON FOR ASSESSMENT:   Consult Hip fracture protocol  ASSESSMENT:   81 y.o. female with medical history of depression, anxiety, chronic pain syndrome, [name], insomnia, and neuropathy. She presented to the ED after a fall. She reported having a cough for the past 3-4 weeks.  12/31- s/p PROCEDURE:  Right INTRAMEDULLARY (IM) NAIL INTERTROCHANTRIC  Reviewed I/O's: -600 ml x 24 hours and -1.3 L since admission  UOP: 600 ml x 24 hours   Spoke with pt at bedside, who reports decreased appetite secondary to nausea. She reports that she has been consuming mostly water, ice cream, and ice pops as food odors exacerbate her nausea.   PTA pt reports good appetite, consuming 2 meals per day (meat, starch, and vegetable).   Pt denies any weight loss.   Discussed importance of good meal and supplement intake to promote healing. Pt amenable to Ensure. Also encouraged pt to order cold food items with less odors to combat nausea.   Labs reviewed.   NUTRITION - FOCUSED PHYSICAL EXAM:  Flowsheet Row Most Recent Value  Orbital Region No depletion  Upper Arm Region No depletion  Thoracic and Lumbar Region No depletion  Buccal Region No depletion  Temple Region No depletion  Clavicle Bone Region No depletion  Clavicle and Acromion Bone Region No depletion  Scapular Bone Region No depletion  Dorsal Hand No depletion  Patellar Region No depletion  Anterior Thigh Region No  depletion  Posterior Calf Region No depletion  Edema (RD Assessment) Mild  Hair Reviewed  Eyes Reviewed  Mouth Reviewed  Skin Reviewed  Nails Reviewed       Diet Order:   Diet Order             Diet Heart Room service appropriate? Yes; Fluid consistency: Thin  Diet effective now                   EDUCATION NEEDS:   Education needs have been addressed  Skin:  Skin Assessment: Skin Integrity Issues: Skin Integrity Issues:: Incisions Incisions: closed rt hip  Last BM:  12/16/21  Height:   Ht Readings from Last 1 Encounters:  12/16/21 5\' 9"  (1.753 m)    Weight:   Wt Readings from Last 1 Encounters:  12/16/21 70.8 kg    Ideal Body Weight:  65.9 kg  BMI:  Body mass index is 23.04 kg/m.  Estimated Nutritional Needs:   Kcal:  1800-2000 kcal  Protein:  100-115 grams  Fluid:  >/= 2 L/day    Loistine Chance, RD, LDN, [place] Registered Dietitian II Certified Diabetes Care and Education Specialist Please refer to [facility] for RD and/or RD on-call/weekend/after hours pager

## 2021-12-20 NOTE — Care Management Important Message (Signed)
Important Message  Patient Details  Name: Alexandra [name] MRN: [id number] Date of Birth: [birth year]   Medicare Important Message Given:  Yes     Juliann Pulse A Mylez Venable 12/20/2021, 12:13 PM

## 2021-12-20 NOTE — Progress Notes (Signed)
Physical Therapy Treatment Patient Details Name: Alexandra [name] MRN: [id number] DOB: [birth year] Today's Date: 12/20/2021   History of Present Illness 81 y.o. female with medical history significant for depression, anxiety, chronic pain syndrome, hyperlipidemia, insomnia, neuropathy, who presents to the emergency department for chief concerns of fall. Imaging reveals displaced intertrochanteric fracture of the proximal right femur. Pt is now s/p R hip IM nail on 12/16/21.    PT Comments    Pt seen following OT session. Received in chair agreeable to PT. Initiated treatment with completion of seated B LE AROM and review of HEP packet to be continued independently. Pt's vitals checked in sitting and standing. Pt only able to tolerate 1 minute of standing when she became dizzy, clammy, and c/o visual changes.  Pt assisted to chair where symptoms resolved after 1 minute. Care team notified. Will see pt again this pm with hopes of progressing functional mobility in standing.    Recommendations for follow up therapy are one component of a multi-disciplinary discharge planning process, led by the attending physician.  Recommendations may be updated based on patient status, additional functional criteria and insurance authorization.  Follow Up Recommendations  Skilled nursing-short term rehab (<3 hours/day)     Assistance Recommended at Discharge Frequent or constant Supervision/Assistance  Patient can return home with the following Other (comment);A little help with walking and/or transfers;A little help with bathing/dressing/bathroom;Assistance with cooking/housework;Assist for transportation;Help with stairs or ramp for entrance (Once BP issues resolve)   Equipment Recommendations  Rolling walker (2 wheels)    Recommendations for Other Services       Precautions / Restrictions Precautions Precautions: Fall Precaution Comments: has been orthostatic and symptomatic Restrictions Weight Bearing  Restrictions: Yes RLE Weight Bearing: Weight bearing as tolerated     Mobility  Bed Mobility               General bed mobility comments: received/left in chair    Transfers Overall transfer level: Needs assistance Equipment used: Rolling walker (2 wheels) Transfers: Sit to/from Stand Sit to Stand: Min assist           General transfer comment: verbal cues for hand placement    Ambulation/Gait               General Gait Details: Pt unable to tolerate due to symptomatic orthostasis   Stairs             Wheelchair Mobility    Modified Rankin (Stroke Patients Only)       Balance Overall balance assessment: Needs assistance Sitting-balance support: No upper extremity supported;Feet supported Sitting balance-Leahy Scale: Good                                      Cognition Arousal/Alertness: Awake/alert Behavior During Therapy: WFL for tasks assessed/performed Overall Cognitive Status: Within Functional Limits for tasks assessed                                 General Comments: Slightly improved sequencing and following commands        Exercises General Exercises - Lower Extremity Long Arc Quad: AROM;Both;10 reps Hip ABduction/ADduction: AROM;Both;10 reps Hip Flexion/Marching: AROM;Both;15 reps Toe Raises: AROM;Both;15 reps Other Exercises Other Exercises: Pt given HEP and reviewed with good teach back    General Comments General comments (skin integrity, edema, etc.): Sitting  BP 106/50  HR 109bpm, O2 sats 98% on 4L O2,  1 minute standing tolerance with BP at 81/60  HR 120bpm 91% on 4L O2      Pertinent Vitals/Pain Pain Assessment: 0-10 Pain Score: 2  Pain Location: R hip with activity Pain Descriptors / Indicators: Aching;Discomfort;Grimacing Pain Intervention(s): Monitored during session    Home Living                          Prior Function            PT Goals (current goals can now  be found in the care plan section) Acute Rehab PT Goals Patient Stated Goal: to return to independence    Frequency    BID      PT Plan Current plan remains appropriate    Co-evaluation              AM-PAC PT "6 Clicks" Mobility   Outcome Measure  Help needed turning from your back to your side while in a flat bed without using bedrails?: A Little Help needed moving from lying on your back to sitting on the side of a flat bed without using bedrails?: A Little Help needed moving to and from a bed to a chair (including a wheelchair)?: A Lot Help needed standing up from a chair using your arms (e.g., wheelchair or bedside chair)?: A Lot Help needed to walk in hospital room?: A Lot Help needed climbing 3-5 steps with a railing? : Total 6 Click Score: 13    End of Session Equipment Utilized During Treatment: Oxygen Activity Tolerance:  (Patient limited by low BP) Patient left: in chair;with call bell/phone within reach;with chair alarm set Nurse Communication: Other (comment) (Vitals) PT Visit Diagnosis: Unsteadiness on feet (R26.81);Other abnormalities of gait and mobility (R26.89);Muscle weakness (generalized) (M62.81);History of falling (Z91.81);Difficulty in walking, not elsewhere classified (R26.2)     Time: 7628-3151 PT Time Calculation (min) (ACUTE ONLY): 34 min  Charges:  $Therapeutic Exercise: 8-22 mins $Therapeutic Activity: 8-22 mins                    Mikel Cella, PTA    Alexandra [name] 12/20/2021, 11:33 AM

## 2021-12-20 NOTE — Progress Notes (Signed)
Occupational Therapy Treatment Patient Details Name: Alexandra [name] MRN: [id number] DOB: 05/12/1941 Today's Date: 12/20/2021   History of present illness 81 y.o. female with medical history significant for depression, anxiety, chronic pain syndrome, hyperlipidemia, insomnia, neuropathy, who presents to the emergency department for chief concerns of fall. Imaging reveals displaced intertrochanteric fracture of the proximal right femur. Pt is now s/p R hip IM nail on 12/16/21.   OT comments  Ms Rorke was seen for OT treatment on this date. Upon arrival to room pt seated in chair, agreeable to tx. Pt requires SETUP + SUPERVISION for seated grooming tasks - cues for ECS. SETUP don/doff L sock in sitting, MAX A don/doff R sock. SpO2 92% on 4L [place] in sitting.  Pt educated in energy conservation strategies including pursed lip breathing, activity pacing, and AE/DME. PT in to room and handed off for mobility as pt reports unable to tolerate x2 AM mobility sessions. Pt making progress toward goals. Pt continues to benefit from skilled OT services to maximize return to PLOF and minimize risk of future falls, injury, caregiver burden, and readmission. Will continue to follow POC. Discharge recommendation remains appropriate.     Recommendations for follow up therapy are one component of a multi-disciplinary discharge planning process, led by the attending physician.  Recommendations may be updated based on patient status, additional functional criteria and insurance authorization.    Follow Up Recommendations  Skilled nursing-short term rehab (<3 hours/day)    Assistance Recommended at Discharge Frequent or constant Supervision/Assistance  Patient can return home with the following  Two people to help with walking and/or transfers;Two people to help with bathing/dressing/bathroom;Help with stairs or ramp for entrance   Equipment Recommendations  Other (comment) (defer to next venue)    Recommendations for  Other Services      Precautions / Restrictions Precautions Precautions: Fall Restrictions Weight Bearing Restrictions: Yes RLE Weight Bearing: Weight bearing as tolerated       Mobility Bed Mobility               General bed mobility comments: received/left in chair    Transfers                   General transfer comment: hand off to PT for mobility as pt reports unable to tolerate x2 AM mobility sessions     Balance Overall balance assessment: Needs assistance Sitting-balance support: No upper extremity supported;Feet supported Sitting balance-Leahy Scale: Good                                     ADL either performed or assessed with clinical judgement   ADL Overall ADL's : Needs assistance/impaired                                       General ADL Comments: SETUP + SUPERVISION for seated grooming tasks - cues for ECS. SETUP don/doff L sock in sitting, MAX A don/doff R sock.      Cognition Arousal/Alertness: Awake/alert Behavior During Therapy: WFL for tasks assessed/performed Overall Cognitive Status: Within Functional Limits for tasks assessed  General Comments SpO2 92% on 4L [place]    Pertinent Vitals/ Pain       Pain Assessment: No/denies pain   Frequency  Min 2X/week        Progress Toward Goals  OT Goals(current goals can now be found in the care plan section)  Progress towards OT goals: Progressing toward goals  Acute Rehab OT Goals Patient Stated Goal: to feel better OT Goal Formulation: With patient Time For Goal Achievement: 01/01/22 Potential to Achieve Goals: Good ADL Goals Pt Will Perform Grooming: with modified independence;sitting Pt Will Perform Lower Body Dressing: with min assist;sitting/lateral leans Pt Will Transfer to Toilet: with min assist;stand pivot transfer;bedside commode  Plan Discharge plan remains  appropriate;Frequency remains appropriate    Co-evaluation                 AM-PAC OT "6 Clicks" Daily Activity     Outcome Measure   Help from another person eating meals?: A Little Help from another person taking care of personal grooming?: A Little Help from another person toileting, which includes using toliet, bedpan, or urinal?: A Lot Help from another person bathing (including washing, rinsing, drying)?: A Lot Help from another person to put on and taking off regular upper body clothing?: A Little Help from another person to put on and taking off regular lower body clothing?: A Lot 6 Click Score: 15    End of Session Equipment Utilized During Treatment: Oxygen  OT Visit Diagnosis: Other abnormalities of gait and mobility (R26.89);Muscle weakness (generalized) (M62.81)   Activity Tolerance Patient tolerated treatment well;Treatment limited secondary to medical complications (Comment)   Patient Left in chair;with call bell/phone within reach;Other (comment) (PT in room)   Nurse Communication          Time: 3536-1443 OT Time Calculation (min): 18 min  Charges: OT General Charges $OT Visit: 1 Visit OT Treatments $Self Care/Home Management : 8-22 mins  Dessie Coma, M.S. OTR/L  12/20/21, 11:01 AM  ascom [phone]

## 2021-12-20 NOTE — TOC Progression Note (Signed)
Transition of Care [facility]) - Progression Note    Patient Details  Name: Alexandra [name] MRN: [id number] Date of Birth: [birth year]  Transition of Care [facility]) CM/SW [place], RN Phone Number: 12/20/2021, 9:27 AM  Clinical Narrative:   no bed offers yet, reached out to local facilities requesting a bed offer, will review once obtained         Expected Discharge Plan and Services                                                 Social Determinants of Health (SDOH) Interventions    Readmission Risk Interventions No flowsheet data found.

## 2021-12-21 LAB — CBC
HCT: 29.8 % — ABNORMAL LOW (ref 36.0–46.0)
Hemoglobin: 9.7 g/dL — ABNORMAL LOW (ref 12.0–15.0)
MCH: 30.8 pg (ref 26.0–34.0)
MCHC: 32.6 g/dL (ref 30.0–36.0)
MCV: 94.6 fL (ref 80.0–100.0)
Platelets: 287 10*3/uL (ref 150–400)
RBC: 3.15 MIL/uL — ABNORMAL LOW (ref 3.87–5.11)
RDW: 13.1 % (ref 11.5–15.5)
WBC: 16.4 10*3/uL — ABNORMAL HIGH (ref 4.0–10.5)
nRBC: 0 % (ref 0.0–0.2)

## 2021-12-21 LAB — BASIC METABOLIC PANEL
Anion gap: 9 (ref 5–15)
BUN: 25 mg/dL — ABNORMAL HIGH (ref 8–23)
CO2: 33 mmol/L — ABNORMAL HIGH (ref 22–32)
Calcium: 8.3 mg/dL — ABNORMAL LOW (ref 8.9–10.3)
Chloride: 96 mmol/L — ABNORMAL LOW (ref 98–111)
Creatinine, Ser: 0.78 mg/dL (ref 0.44–1.00)
GFR, Estimated: >60 mL/min (ref >60)
Glucose, Bld: 103 mg/dL — ABNORMAL HIGH (ref 70–99)
Potassium: 3.8 mmol/L (ref 3.5–5.1)
Sodium: 138 mmol/L (ref 135–145)

## 2021-12-21 LAB — RESP PANEL BY RT-PCR (FLU A&B, COVID) ARPGX2
Influenza A by PCR: NEGATIVE
Influenza B by PCR: NEGATIVE
SARS Coronavirus 2 by RT PCR: NEGATIVE

## 2021-12-21 LAB — PROCALCITONIN: Procalcitonin: 0.52 ng/mL

## 2021-12-21 MED ORDER — LIDOCAINE 5 % EX PTCH
2.0000 | MEDICATED_PATCH | CUTANEOUS | Status: DC
  Administered 2021-12-22 – 2021-12-23 (×2): 2 via TRANSDERMAL
  Filled 2021-12-21 (×4): qty 2

## 2021-12-21 MED ORDER — GUAIFENESIN ER 600 MG PO TB12
600.0000 mg | ORAL_TABLET | Freq: Two times a day (BID) | ORAL | Status: DC
  Administered 2021-12-21 – 2021-12-24 (×6): 600 mg via ORAL
  Filled 2021-12-21 (×6): qty 1

## 2021-12-21 MED ORDER — SODIUM CHLORIDE 0.9 % IV SOLN: INTRAVENOUS | Status: AC

## 2021-12-21 MED ORDER — BISACODYL 10 MG RE SUPP
10.0000 mg | Freq: Once | RECTAL | Status: AC
  Administered 2021-12-21: 10 mg via RECTAL
  Filled 2021-12-21: qty 1

## 2021-12-21 MED ORDER — SODIUM CHLORIDE 0.9 % IV SOLN
3.0000 g | Freq: Four times a day (QID) | INTRAVENOUS | Status: DC
  Administered 2021-12-21 – 2021-12-24 (×11): 3 g via INTRAVENOUS
  Filled 2021-12-21 (×7): qty 8
  Filled 2021-12-21: qty 3
  Filled 2021-12-21 (×6): qty 8
  Filled 2021-12-21: qty 3
  Filled 2021-12-21: qty 8

## 2021-12-21 MED ORDER — SODIUM CHLORIDE 0.9 % IV BOLUS
500.0000 mL | Freq: Once | INTRAVENOUS | Status: AC
  Administered 2021-12-21: 500 mL via INTRAVENOUS

## 2021-12-21 NOTE — Progress Notes (Signed)
PROGRESS NOTE    Alexandra [name]  RJJ:[id number] DOB: 08/24/1941 DOA: 12/16/2021 PCP: Lynnell Jude, MD   Chief Complaint  Patient presents with   Fall   Leg Pain  Brief Narrative/Hospital Course: Alexandra [name], 81 y.o. female with PMH of anxiety/depression with chronic pain syndrome, [name], insomnia, neuropathy presented after a fall, found to have right femoral fracture. She was admitted for further management. Patient underwent ORIF on 12/31 and working with PT OT and advising skilled nursing facility.  She has been orthostatic, hypoxic needing supplemental oxygen.   Subjective: Seen and examined this morning. Some urinary retention this morning, patient also was orthostatic with PT and remains very deconditioned Complains of constipation Also continues to need supplemental oxygen Labs with persistent leukocytosis which is downtrending, procalcitonin was elevated 0.5  Assessment & Plan:  Right femoral neck fracture presumed to be due to mechanical fall: Status post ORIF 12/31st, continue PT OT, pain control bowel regimen, along with monitoring of voiding in and out cath if needed.  PT OT has assessed a skilled nursing facility.  Orthopedics on board.  On Lovenox for DVT prophylaxis.  Acute hypoxic respiratory failure postoperatively Elevated D-dimer-no PE on CTA, no DVT and ultrasound 1/1 Right posterior basilar pneumonia likely aspiration pneumonia: Patient needing supplemental oxygen, D-dimer is elevated underwent subsequent CTA to 1/3 no PE but showed pneumonia as above, procalcitonin is elevated 0.52, she also had leukocytosis which is ongoing ,start on Unasyn.  Orthostatic hypotension is still positive this morning we will give IV fluid x1 if recurrent will consider midodrine.  Constipation:added laxatives as needed  Polypharmacy Anxiety/depression Insomnia: Progress attending had prolonged and extensive discussion with family daughter and husband as well as patient, since  he has had uncontrolled pain after back surgery 1997 and has been on very high dose of gabapentin and Ambien as well as narcotics.  These are likely contributory to her presentation, now gabapentin dropped to 80 mg 3 times daily, on Ambien 5 mg nightly and tolerating well.  Continue decreasing the dose slowly hopefully can come to acceptable level of gabapentin and eventually discontinue Ambien.  Advised follow-up with pain specialist  Possible urinary obstruction needing In-N-Out cath, nursing to monitor postvoid residual encourage ambulation  GERD : Continue PPI  Incidental right lower lobe and right upper lobe nodule pleural-based discussed with patient's daughter for pulmonary follow-up and CT imaging in 3 months  History of peripheral neuropathy with possible lumbar radiculopathy continue Neurontin at decreased dose of 800 3 times daily  Urticaria post morphine on Benadryl 12.5 mg every 8 hours as needed  Hyperlipidemia: On simvastatin  DVT prophylaxis: enoxaparin (LOVENOX) injection 40 mg Start: 12/18/21 0900 Place TED hose Start: 12/16/21 1844 Code Status:   Code Status: Full Code Family Communication: plan of care discussed with patient and her daughter Status is: Inpatient Remains inpatient appropriate because: For ongoing managed [place] hypoxic respiratory failure pneumonia and hip surgery Disposition: Currently not medically stable for discharge. Anticipated Disposition: SNF  Total time spent in the care of this patient 35 MINUTES Objective: Vitals last 24 hrs: Vitals:   12/21/21 0427 12/21/21 0826 12/21/21 1217 12/21/21 1240  BP: (!) 122/58 (!) 131/52 (!) 113/52 (!) 88/40  Pulse: 86 88 89 (!) 110  Resp: 17 16 16    Temp:  98.4 F (36.9 C) 98.5 F (36.9 C)   TempSrc:   Oral   SpO2: 100% 97% (!) 89%   Weight:      Height:  Weight change:   Intake/Output Summary (Last 24 hours) at 12/21/2021 1416 Last data filed at 12/21/2021 9379 Gross per 24 hour  Intake 120 ml   Output 150 ml  Net -30 ml   Net IO Since Admission: -2,075 mL [12/21/21 1416]   Physical Examination: General exam: AA oriented at baseline, weak,older than stated age. HEENT:Oral mucosa moist, Ear/Nose WNL grossly,dentition normal. Respiratory system: B/l basal crackles BS, no use of accessory muscle, non tender. Cardiovascular system: S1 & S2 +,No JVD. Gastrointestinal system: Abdomen soft, NT,ND, BS+. Nervous System:Alert, awake, moving extremities. Extremities: edema none, distal peripheral pulses palpable.  Right hip surgical site with dressing in place no drainage Skin: No rashes, no icterus. MSK: Normal muscle bulk, tone, power.  Medications reviewed:  Scheduled Meds:  amitriptyline  50 mg Oral QHS   celecoxib  200 mg Oral BID   Chlorhexidine Gluconate Cloth  6 each Topical Daily   enoxaparin (LOVENOX) injection  40 mg Subcutaneous Q24H   feeding supplement  237 mL Oral BID BM   gabapentin  800 mg Oral TID   latanoprost  1 drop Both Eyes QHS   multivitamin with minerals  1 tablet Oral Daily   pantoprazole  40 mg Oral QPC breakfast   sertraline  25 mg Oral Daily   simvastatin  40 mg Oral QPM   Continuous Infusions:  ampicillin-sulbactam (UNASYN) IV 3 g (12/21/21 1236)   Diet Order             Diet Heart Room service appropriate? Yes; Fluid consistency: Thin  Diet effective now                   Nutrition Problem: Increased nutrient needs Etiology: hip fracture, post-op healing Signs/Symptoms: estimated needs Interventions: Ensure Enlive (each supplement provides 350kcal and 20 grams of protein), MVI  Weight change:   Wt Readings from Last 3 Encounters:  12/16/21 70.8 kg  02/06/17 74.8 kg     Consultants:see note  Procedures:see note Antimicrobials: Anti-infectives (From admission, onward)    Start     Dose/Rate Route Frequency Ordered Stop   12/21/21 1200  Ampicillin-Sulbactam (UNASYN) 3 g in sodium chloride 0.9 % 100 mL IVPB        3 g 200 mL/hr  over 30 Minutes Intravenous Every 6 hours 12/21/21 1019     12/17/21 1158  ceFAZolin (ANCEF) 2-4 GM/100ML-% IVPB       Note to Pharmacy: Mirna Mires: cabinet override      12/17/21 1158 12/17/21 1408   12/17/21 1100  ceFAZolin (ANCEF) IVPB 2g/100 mL premix        2 g 200 mL/hr over 30 Minutes Intravenous To Surgery 12/16/21 2210 12/17/21 1438      Culture/Microbiology No results found for: SDES, [place], CULT, REPTSTATUS  Other culture-see note  Unresulted Labs (From admission, onward)     Start     Ordered   12/22/21 0500  Procalcitonin  Daily,   R     Question:  Specimen collection method  Answer:  Lab=Lab collect   12/21/21 0824   12/21/21 1039  Resp Panel by RT-PCR (Flu A&B, Covid) Nasopharyngeal Swab  (Tier 2 - Symptomatic/asymptomatic)  Once,   R        12/21/21 1038   12/18/21 0500  CBC  Daily,   R     Question:  Specimen collection method  Answer:  Lab=Lab collect   12/17/21 1717   12/18/21 0240  Basic metabolic panel  Daily,  R     Question:  Specimen collection method  Answer:  Lab=Lab collect   12/17/21 1717          Data Reviewed: I have personally reviewed following labs and imaging studies CBC: Recent Labs  Lab 12/16/21 0942 12/18/21 0638 12/19/21 0509 12/20/21 0521 12/21/21 0459  WBC 14.7* 20.2* 20.1* 17.9* 16.4*  NEUTROABS 12.4*  --   --   --   --   HGB 13.3 10.7* 10.2* 10.2* 9.7*  HCT 40.9 32.2* 31.9* 31.7* 29.8*  MCV 95.8 94.2 95.2 94.6 94.6  PLT 248 222 255 296 884   Basic Metabolic Panel: Recent Labs  Lab 12/16/21 0942 12/18/21 0638 12/19/21 0509 12/20/21 0521 12/21/21 0459  NA 138 133* 137 140 138  K 4.0 3.5 3.8 3.7 3.8  CL 98 97* 97* 99 96*  CO2 31 29 31  33* 33*  GLUCOSE 134* 130* 117* 119* 103*  BUN 14 18 23  24* 25*  CREATININE 0.75 0.68 0.79 0.75 0.78  CALCIUM 8.9 8.5* 8.8* 8.6* 8.3*   GFR: Estimated Creatinine Clearance: 59.6 mL/min (by C-G formula based on SCr of 0.78 mg/dL). Liver Function Tests: Recent Labs   Lab 12/16/21 0942  AST 24  ALT 14  ALKPHOS 100  BILITOT 0.6  PROT 8.0  ALBUMIN 4.2   No results for input(s): LIPASE, AMYLASE in the last 168 hours. No results for input(s): AMMONIA in the last 168 hours. Coagulation Profile: Recent Labs  Lab 12/16/21 1414  INR 1.0   Cardiac Enzymes: Recent Labs  Lab 12/16/21 1414  CKTOTAL 60   BNP (last 3 results) No results for input(s): PROBNP in the last 8760 hours. HbA1C: No results for input(s): HGBA1C in the last 72 hours. CBG: No results for input(s): GLUCAP in the last 168 hours. Lipid Profile: No results for input(s): CHOL, HDL, LDLCALC, TRIG, CHOLHDL, LDLDIRECT in the last 72 hours. Thyroid Function Tests: No results for input(s): TSH, T4TOTAL, FREET4, T3FREE, THYROIDAB in the last 72 hours. Anemia Panel: No results for input(s): VITAMINB12, FOLATE, FERRITIN, TIBC, IRON, RETICCTPCT in the last 72 hours. Sepsis Labs: Recent Labs  Lab 12/16/21 1660 12/16/21 1139 12/16/21 2147 12/21/21 0458  PROCALCITON <0.10  --   --  0.52  LATICACIDVEN  --  1.2 1.5  --     Recent Results (from the past 240 hour(s))  Resp Panel by RT-PCR (Flu A&B, Covid) Nasopharyngeal Swab     Status: None   Collection Time: 12/16/21  2:14 PM   Specimen: Nasopharyngeal Swab; Nasopharyngeal(NP) swabs in vial transport medium  Result Value Ref Range Status   SARS Coronavirus 2 by RT PCR NEGATIVE NEGATIVE Final    Comment: (NOTE) SARS-CoV-2 target nucleic acids are NOT DETECTED.  The SARS-CoV-2 RNA is generally detectable in upper respiratory specimens during the acute phase of infection. The lowest concentration of SARS-CoV-2 viral copies this assay can detect is 138 copies/mL. A negative result does not preclude SARS-Cov-2 infection and should not be used as the sole basis for treatment or other patient management decisions. A negative result may occur with  improper specimen collection/handling, submission of specimen other than nasopharyngeal  swab, presence of viral mutation(s) within the areas targeted by this assay, and inadequate number of viral copies(<138 copies/mL). A negative result must be combined with clinical observations, patient history, and epidemiological information. The expected result is Negative.  Fact Sheet for Patients:  EntrepreneurPulse.com.au  Fact Sheet for Healthcare Providers:  IncredibleEmployment.be  This test is no t yet  approved or cleared by the [place] and  has been authorized for detection and/or diagnosis of SARS-CoV-2 by FDA under an Emergency Use Authorization (EUA). This EUA will remain  in effect (meaning this test can be used) for the duration of the COVID-19 declaration under Section 564(b)(1) of the Act, 21 U.S.C.section 360bbb-3(b)(1), unless the authorization is terminated  or revoked sooner.       Influenza A by PCR NEGATIVE NEGATIVE Final   Influenza B by PCR NEGATIVE NEGATIVE Final    Comment: (NOTE) The Xpert Xpress SARS-CoV-2/FLU/RSV plus assay is intended as an aid in the diagnosis of influenza from Nasopharyngeal swab specimens and should not be used as a sole basis for treatment. Nasal washings and aspirates are unacceptable for Xpert Xpress SARS-CoV-2/FLU/RSV testing.  Fact Sheet for Patients: EntrepreneurPulse.com.au  Fact Sheet for Healthcare Providers: IncredibleEmployment.be  This test is not yet approved or cleared by the [place] FDA and has been authorized for detection and/or diagnosis of SARS-CoV-2 by FDA under an Emergency Use Authorization (EUA). This EUA will remain in effect (meaning this test can be used) for the duration of the COVID-19 declaration under Section 564(b)(1) of the Act, 21 U.S.C. section 360bbb-3(b)(1), unless the authorization is terminated or revoked.  Performed at [facility], [address] [place], [place] [address]   MRSA  Next Gen by PCR, Nasal     Status: None   Collection Time: 12/17/21  2:30 AM   Specimen: Nasal Mucosa; Nasal Swab  Result Value Ref Range Status   MRSA by PCR Next Gen NOT DETECTED NOT DETECTED Final    Comment: (NOTE) The GeneXpert MRSA Assay (FDA approved for NASAL specimens only), is one component of a comprehensive MRSA colonization surveillance program. It is not intended to diagnose MRSA infection nor to guide or monitor treatment for MRSA infections. Test performance is not FDA approved in patients less than 79 years old. Performed at [facility], [address] [place], [place] [address]      Radiology Studies: CT Angio Chest Pulmonary Embolism (PE) W or WO Contrast  Result Date: 12/20/2021 CLINICAL DATA:  Positive D-dimer level. EXAM: CT ANGIOGRAPHY CHEST WITH CONTRAST TECHNIQUE: Multidetector CT imaging of the chest was performed using the standard protocol during bolus administration of intravenous contrast. Multiplanar CT image reconstructions and MIPs were obtained to evaluate the vascular anatomy. CONTRAST:  57mL OMNIPAQUE IOHEXOL 350 MG/ML SOLN COMPARISON:  December 16, 2021. FINDINGS: Cardiovascular: Satisfactory opacification of the pulmonary arteries to the segmental level. No evidence of pulmonary embolism. Normal heart size. No pericardial effusion. Atherosclerosis of thoracic aorta is noted without aneurysm or dissection. Mediastinum/Nodes: Esophagus is unremarkable. Thyroid gland appears normal. Right hilar adenopathy is noted with largest lymph node measuring 15 mm. 11 mm precarinal lymph node is noted. Lungs/Pleura: No pneumothorax is noted. New right lower lobe airspace opacity is noted concerning for pneumonia. Mild left basilar subsegmental atelectasis is noted. 12 x 8 mm irregular density is noted posteriorly in the right upper lobe best seen on image number 32 of series 6 which is not changed compared to prior exam. Upper Abdomen: No acute abnormality.  Musculoskeletal: No chest wall abnormality. No acute or significant osseous findings. Review of the MIP images confirms the above findings. IMPRESSION: No definite evidence of pulmonary embolus. New right posterior basilar airspace opacity is noted concerning for aspiration pneumonia. Mild left basilar subsegmental atelectasis is noted. Stable 12 x 8 mm irregular density with pleural tail is noted posteriorly  in right upper lobe. Consider one of the following in 3 months for both low-risk and high-risk individuals: (a) repeat chest CT, (b) follow-up PET-CT, or (c) tissue sampling. This recommendation follows the consensus statement: Guidelines for Management of Incidental Pulmonary Nodules Detected on CT Images: From the Fleischner Society 2017; Radiology 2017; 284:228-243. Enlarged right hilar and precarinal adenopathy is noted which may be inflammatory in etiology, but attention to this abnormality on follow-up imaging is recommended to rule out metastatic disease or malignancy. Aortic Atherosclerosis (ICD10-I70.0). Electronically Signed   By: Marijo Conception M.D.   On: 12/20/2021 18:08   DG Chest Port 1 View  Result Date: 12/20/2021 CLINICAL DATA:  Hypoxia.  COPD.  ORIF right femur. EXAM: PORTABLE CHEST 1 VIEW COMPARISON:  12/16/2021 FINDINGS: Interval development of mild right lower lobe airspace disease and small right effusion. Left lung remains clear. Heart size and vascularity normal.  Negative for heart failure IMPRESSION: Interval development of right lower lobe airspace disease and small right effusion. Probable pneumonia. Electronically Signed   By: Franchot Gallo M.D.   On: 12/20/2021 11:59     LOS: 5 days   Antonieta Pert, MD [facility]  12/21/2021, 2:16 PM

## 2021-12-21 NOTE — Progress Notes (Signed)
°  Subjective:  Patient is sitting comfortably at her bedside chair.  She reports pain is relatively well controlled at this time.   Objective:   VITALS:   Vitals:   12/21/21 0427 12/21/21 0826 12/21/21 1217 12/21/21 1240  BP: (!) 122/58 (!) 131/52 (!) 113/52 (!) 88/40  Pulse: 86 88 89 (!) 110  Resp: 17 16 16    Temp:  98.4 F (36.9 C) 98.5 F (36.9 C)   TempSrc:   Oral   SpO2: 100% 97% (!) 89%   Weight:      Height:        PHYSICAL EXAM:  General: Alert, comfortable  Right lower extremity Dressings are clean and dry Neurovascular intact Sensation intact distally Intact pulses distally Dorsiflexion/Plantar flexion intact Compartment soft  LABS  Results for orders placed or performed during the hospital encounter of 12/16/21 (from the past 24 hour(s))  Procalcitonin - Baseline     Status: None   Collection Time: 12/21/21  4:58 AM  Result Value Ref Range   Procalcitonin 0.52 ng/mL  CBC     Status: Abnormal   Collection Time: 12/21/21  4:59 AM  Result Value Ref Range   WBC 16.4 (H) 4.0 - 10.5 K/uL   RBC 3.15 (L) 3.87 - 5.11 MIL/uL   Hemoglobin 9.7 (L) 12.0 - 15.0 g/dL   HCT 29.8 (L) 36.0 - 46.0 %   MCV 94.6 80.0 - 100.0 fL   MCH 30.8 26.0 - 34.0 pg   MCHC 32.6 30.0 - 36.0 g/dL   RDW 13.1 11.5 - 15.5 %   Platelets 287 150 - 400 K/uL   nRBC 0.0 0.0 - 0.2 %  Basic metabolic panel     Status: Abnormal   Collection Time: 12/21/21  4:59 AM  Result Value Ref Range   Sodium 138 135 - 145 mmol/L   Potassium 3.8 3.5 - 5.1 mmol/L   Chloride 96 (L) 98 - 111 mmol/L   CO2 33 (H) 22 - 32 mmol/L   Glucose, Bld 103 (H) 70 - 99 mg/dL   BUN 25 (H) 8 - 23 mg/dL   Creatinine, Ser 0.78 0.44 - 1.00 mg/dL   Calcium 8.3 (L) 8.9 - 10.3 mg/dL   GFR, Estimated >60 >60 mL/min   Anion gap 9 5 - 15     Assessment/Plan: 4 Days Post-Op  Status post right hip IM nail.  Patient is progressing with physical therapy.  She has been accepted at [facility] in [place].  -Appreciate hospitalist  team support/management -PT/OT: Weight-bear as tolerated right lower extremity -Okay to start DVT prophylaxis, Lovenox x14 days -Discharge to rehab in [place] pending PT/medical clearance -Dressing changes per nursing to keep incisions clean and dry -Follow-up in clinic in 2 weeks for staple removal   Renee Harder , MD 12/21/2021, 12:55 PM

## 2021-12-21 NOTE — Plan of Care (Signed)

## 2021-12-21 NOTE — TOC Progression Note (Signed)
Transition of Care [facility]) - Progression Note    Patient Details  Name: Alexandra [name] MRN: [id number] Date of Birth: 1941/03/10  [facility]) CM/SW [place], RN Phone Number: 12/21/2021, 10:26 AM  Clinical Narrative:   Spoke with the patient and reviewed the bed offers, She chose [facility] in [place], I called Ricky at [facility] and accepted the bed, also accepted in the hub, I called [facility] and spoke with Colletta [place] and started the insurance auth also requested EMS auth for transportation          Expected Discharge Plan and Services                                                 Social Determinants of Health (SDOH) Interventions    Readmission Risk Interventions No flowsheet data found.

## 2021-12-21 NOTE — Progress Notes (Signed)
Physical Therapy Treatment Patient Details Name: Alexandra [name] MRN: [id number] DOB: [birth year] Today's Date: 12/21/2021   History of Present Illness 81 y.o. female with medical history significant for depression, anxiety, chronic pain syndrome, hyperlipidemia, insomnia, neuropathy, who presents to the emergency department for chief concerns of fall. Imaging reveals displaced intertrochanteric fracture of the proximal right femur. Pt is now s/p R hip IM nail on 12/16/21.    PT Comments    Pt seen this am, received in bed, daughter at bedside. Pt on 3L O2 with resting sats at 95%. Pt completed strengthening exercises in bed and transferred to sitting EOB with MinA. Pt assisted with commode transfer and transitioned to bedside chair with RW and MinA.  Pt's blood pressure assessed again today without improvement and again unable to tolerate standing for greater than one minute. Sitting 113/51  HR 89bpm, Standing 88/40  HR 110bpm with pt very symptomatic.  Nursing and MD notified. Pt would most likely be tolerating distance ambulation at this time if BP was not an issue. May try TED Hose/Abd Binder next visit.    Recommendations for follow up therapy are one component of a multi-disciplinary discharge planning process, led by the attending physician.  Recommendations may be updated based on patient status, additional functional criteria and insurance authorization.  Follow Up Recommendations  Skilled nursing-short term rehab (<3 hours/day)     Assistance Recommended at Discharge Frequent or constant Supervision/Assistance  Patient can return home with the following Other (comment);A little help with walking and/or transfers;A little help with bathing/dressing/bathroom;Assistance with cooking/housework;Assist for transportation;Help with stairs or ramp for entrance   Equipment Recommendations  Rolling walker (2 wheels)    Recommendations for Other Services       Precautions / Restrictions  Precautions Precautions: Fall Precaution Comments: has been orthostatic and symptomatic Restrictions Weight Bearing Restrictions: Yes RLE Weight Bearing: Weight bearing as tolerated     Mobility  Bed Mobility Overal bed mobility: Needs Assistance Bed Mobility: Rolling;Supine to Sit;Sit to Supine Rolling: Min assist (use of side rail)   Supine to sit: HOB elevated;Min assist          Transfers Overall transfer level: Needs assistance Equipment used: Rolling walker (2 wheels) Transfers: Sit to/from Stand Sit to Stand: Min assist           General transfer comment: verbal cues for hand placement    Ambulation/Gait               General Gait Details: Pt unable to tolerate due to symptomatic orthostasis   Stairs             Wheelchair Mobility    Modified Rankin (Stroke Patients Only)       Balance                                            Cognition Arousal/Alertness: Awake/alert Behavior During Therapy: WFL for tasks assessed/performed Overall Cognitive Status: Within Functional Limits for tasks assessed                                 General Comments: Slightly improved sequencing and following commands (Noted slight confusion today, unable to remember if she had gotten out of bed or not)        Exercises Total Joint Exercises Ankle Circles/Pumps: AROM;10  reps;Both Quad Sets: AROM;Both;10 reps Gluteal Sets: AROM;Both;10 reps Heel Slides: Right;10 reps;AROM Hip ABduction/ADduction: AAROM;Right;10 reps Long Arc Quad: AROM;Both;10 reps    General Comments General comments (skin integrity, edema, etc.): Sitting 113/51  HR 89,  Standing 88/40  HR 110      Pertinent Vitals/Pain Pain Assessment: 0-10 Pain Score: 4  Breathing: normal Pain Location: R hip with activity Pain Descriptors / Indicators: Aching;Discomfort;Grimacing Pain Intervention(s): Monitored during session;Patient requesting pain meds-RN  notified    Home Living                          Prior Function            PT Goals (current goals can now be found in the care plan section) Acute Rehab PT Goals Patient Stated Goal: to return to independence    Frequency    BID      PT Plan Current plan remains appropriate    Co-evaluation              AM-PAC PT "6 Clicks" Mobility   Outcome Measure  Help needed turning from your back to your side while in a flat bed without using bedrails?: A Little Help needed moving from lying on your back to sitting on the side of a flat bed without using bedrails?: A Little Help needed moving to and from a bed to a chair (including a wheelchair)?: A Little Help needed standing up from a chair using your arms (e.g., wheelchair or bedside chair)?: A Little Help needed to walk in hospital room?: A Lot Help needed climbing 3-5 steps with a railing? : Total 6 Click Score: 15    End of Session Equipment Utilized During Treatment: Gait belt;Oxygen (3L O2) Activity Tolerance: Patient tolerated treatment well Patient left: in chair;with call bell/phone within reach;with chair alarm set Nurse Communication: Other (comment) (Orthostatic BP) PT Visit Diagnosis: Unsteadiness on feet (R26.81);Other abnormalities of gait and mobility (R26.89);Muscle weakness (generalized) (M62.81);History of falling (Z91.81);Difficulty in walking, not elsewhere classified (R26.2)     Time: 1140-1230 PT Time Calculation (min) (ACUTE ONLY): 50 min  Charges:  $Therapeutic Exercise: 8-22 mins $Therapeutic Activity: 23-37 mins                    Mikel Cella, PTA    [name] 12/21/2021, 3:53 PM

## 2021-12-21 NOTE — Progress Notes (Signed)
Pharmacy Antibiotic Note  Alexandra [name] is a 82 y.o. female admitted on 12/16/2021 with aspiration pneumonia.  Pharmacy has been consulted for unasyn dosing.  S/p ORIF 12/31   Plan: Will order Unasyn 3 gm IV q6h    Height: 5\' 9"  (175.3 cm) Weight: 70.8 kg (156 lb) IBW/kg (Calculated) : 66.2  Temp (24hrs), Avg:98.3 F (36.8 C), Min:98.1 F (36.7 C), Max:98.4 F (36.9 C)  Recent Labs  Lab 12/16/21 0942 12/16/21 1139 12/16/21 2147 12/18/21 0638 12/19/21 0509 12/20/21 0521 12/21/21 0459  WBC 14.7*  --   --  20.2* 20.1* 17.9* 16.4*  CREATININE 0.75  --   --  0.68 0.79 0.75 0.78  LATICACIDVEN  --  1.2 1.5  --   --   --   --     Estimated Creatinine Clearance: 59.6 mL/min (by C-G formula based on SCr of 0.78 mg/dL).    Allergies  Allergen Reactions   Avelox [Moxifloxacin Hcl] Shortness Of Breath and Swelling   Elemental Sulfur Other (See Comments)    Reaction as a child and not sure what.     Antimicrobials this admission: Unasyn 1/4   >>       >>    Dose adjustments this admission:    Microbiology results:   BCx:     UCx:      Sputum:    12/31 MRSA PCR: neg  Thank you for allowing pharmacy to be a part of this patients care.  Britiny Defrain A 12/21/2021 10:20 AM

## 2021-12-22 LAB — BASIC METABOLIC PANEL
Anion gap: 8 (ref 5–15)
BUN: 19 mg/dL (ref 8–23)
CO2: 33 mmol/L — ABNORMAL HIGH (ref 22–32)
Calcium: 8.3 mg/dL — ABNORMAL LOW (ref 8.9–10.3)
Chloride: 103 mmol/L (ref 98–111)
Creatinine, Ser: 0.6 mg/dL (ref 0.44–1.00)
GFR, Estimated: >60 mL/min (ref >60)
Glucose, Bld: 98 mg/dL (ref 70–99)
Potassium: 3.8 mmol/L (ref 3.5–5.1)
Sodium: 144 mmol/L (ref 135–145)

## 2021-12-22 LAB — CBC
HCT: 27.5 % — ABNORMAL LOW (ref 36.0–46.0)
Hemoglobin: 8.7 g/dL — ABNORMAL LOW (ref 12.0–15.0)
MCH: 30.2 pg (ref 26.0–34.0)
MCHC: 31.6 g/dL (ref 30.0–36.0)
MCV: 95.5 fL (ref 80.0–100.0)
Platelets: 315 10*3/uL (ref 150–400)
RBC: 2.88 MIL/uL — ABNORMAL LOW (ref 3.87–5.11)
RDW: 13.2 % (ref 11.5–15.5)
WBC: 13.2 10*3/uL — ABNORMAL HIGH (ref 4.0–10.5)
nRBC: 0 % (ref 0.0–0.2)

## 2021-12-22 LAB — PROCALCITONIN: Procalcitonin: 0.52 ng/mL

## 2021-12-22 NOTE — Progress Notes (Signed)
Physical Therapy Treatment Patient Details Name: Alexandra [name] MRN: [id number] DOB: 06-24-1941 Today's Date: 12/22/2021   History of Present Illness 81 y.o. female with medical history significant for depression, anxiety, chronic pain syndrome, hyperlipidemia, insomnia, neuropathy, who presents to the emergency department for chief concerns of fall. Imaging reveals displaced intertrochanteric fracture of the proximal right femur. Pt is now s/p R hip IM nail on 12/16/21.    PT Comments    Pt seen briefly this pm after returning to bed for B LE strengthening and ROM exercises. Pt currently receiving IV fluids with hopes BP will improve and allow for increased functional mobility training. Discussed POC with pt and benefits of short term SNF placement once medically stable.    Recommendations for follow up therapy are one component of a multi-disciplinary discharge planning process, led by the attending physician.  Recommendations may be updated based on patient status, additional functional criteria and insurance authorization.  Follow Up Recommendations  Skilled nursing-short term rehab (<3 hours/day)     Assistance Recommended at Discharge Intermittent Supervision/Assistance  Patient can return home with the following A little help with walking and/or transfers;A little help with bathing/dressing/bathroom;Assist for transportation;Help with stairs or ramp for entrance   Equipment Recommendations  Rolling walker (2 wheels)    Recommendations for Other Services       Precautions / Restrictions Precautions Precautions: Fall Precaution Comments: has been orthostatic and symptomatic Restrictions Weight Bearing Restrictions: Yes RLE Weight Bearing: Weight bearing as tolerated     Mobility  Bed Mobility               General bed mobility comments: received/left in chair    Transfers Overall transfer level: Needs assistance Equipment used: Rolling walker (2 wheels) Transfers:  Sit to/from Stand Sit to Stand: Min assist           General transfer comment: verbal cues for hand placement    Ambulation/Gait Ambulation/Gait assistance: Min guard Gait Distance (Feet): 55 Feet Assistive device: Rolling walker (2 wheels) Gait Pattern/deviations: Step-to pattern;Decreased stance time - right       General Gait Details: First day tolerating gait training since BP has improved. Verbal cues for proper sequencing and safety awareness. Pt remained on 3L O2 sats remained at 95%   Stairs             Wheelchair Mobility    Modified Rankin (Stroke Patients Only)       Balance Overall balance assessment: Needs assistance Sitting-balance support: No upper extremity supported;Feet supported Sitting balance-Leahy Scale: Good Sitting balance - Comments:  (No dizziness in sitting)   Standing balance support: Bilateral upper extremity supported;Reliant on assistive device for balance Standing balance-Leahy Scale: Fair Standing balance comment:  (Able to tolerate 2 minutes of standing with minimal lightheadedness)                            Cognition Arousal/Alertness: Awake/alert Behavior During Therapy: WFL for tasks assessed/performed Overall Cognitive Status: Within Functional Limits for tasks assessed                                 General Comments: Pt following single step commands much better today.        Exercises Total Joint Exercises Ankle Circles/Pumps: AROM;10 reps;Both Long Arc Quad: AROM;Both;10 reps    General Comments General comments (skin integrity, edema, etc.): see vitals  Pertinent Vitals/Pain Pain Assessment: 0-10 Pain Score: 4  Breathing: normal Pain Location: R hip with activity Pain Descriptors / Indicators: Discomfort;Grimacing    Home Living                          Prior Function            PT Goals (current goals can now be found in the care plan section) Acute  Rehab PT Goals Patient Stated Goal: to return to independence Progress towards PT goals: Progressing toward goals    Frequency    BID      PT Plan Current plan remains appropriate    Co-evaluation              AM-PAC PT "6 Clicks" Mobility   Outcome Measure  Help needed turning from your back to your side while in a flat bed without using bedrails?: A Little Help needed moving from lying on your back to sitting on the side of a flat bed without using bedrails?: A Little Help needed moving to and from a bed to a chair (including a wheelchair)?: A Little Help needed standing up from a chair using your arms (e.g., wheelchair or bedside chair)?: A Little Help needed to walk in hospital room?: A Little Help needed climbing 3-5 steps with a railing? : A Lot 6 Click Score: 17    End of Session Equipment Utilized During Treatment: Gait belt;Oxygen Activity Tolerance: Patient tolerated treatment well Patient left: in chair;with call bell/phone within reach;with chair alarm set Nurse Communication: Mobility status PT Visit Diagnosis: Unsteadiness on feet (R26.81);Other abnormalities of gait and mobility (R26.89);Muscle weakness (generalized) (M62.81);History of falling (Z91.81);Difficulty in walking, not elsewhere classified (R26.2)     Time: 1000-1055 PT Time Calculation (min) (ACUTE ONLY): 55 min  Charges:  $Gait Training: 8-22 mins $Therapeutic Exercise: 8-22 mins $Therapeutic Activity: 23-37 mins                    Mikel Cella, PTA    [name] 12/22/2021, 11:18 AM

## 2021-12-22 NOTE — Progress Notes (Signed)
Physical Therapy Treatment Patient Details Name: Alexandra [name] MRN: [id number] DOB: 1941/10/05 Today's Date: 12/22/2021   History of Present Illness 81 y.o. female with medical history significant for depression, anxiety, chronic pain syndrome, hyperlipidemia, insomnia, neuropathy, who presents to the emergency department for chief concerns of fall. Imaging reveals displaced intertrochanteric fracture of the proximal right femur. Pt is now s/p R hip IM nail on 12/16/21.    PT Comments    Pt seen for pm session, c/o fatigue and increased discomfort R LE with advancement in mobility this am. Pt however motivated to improve now that BP issues are resolving. Bed mobility with supervison. Sit<>stand from various surfaces with CGA. Pt completed gait training with RW, 3 L O2 2ft with light CGA for safety. No c/o SOB or dizziness during standing. Pt will need to increased functional independence and practice stairs prior to returning home. SNF is currently the plan since pt has not had much mobility training due to BP.  Continue PT per POC.    Recommendations for follow up therapy are one component of a multi-disciplinary discharge planning process, led by the attending physician.  Recommendations may be updated based on patient status, additional functional criteria and insurance authorization.  Follow Up Recommendations  Skilled nursing-short term rehab (<3 hours/day)     Assistance Recommended at Discharge Intermittent Supervision/Assistance  Patient can return home with the following A little help with walking and/or transfers;A little help with bathing/dressing/bathroom;Assist for transportation;Help with stairs or ramp for entrance   Equipment Recommendations  Rolling walker (2 wheels)    Recommendations for Other Services       Precautions / Restrictions Precautions Precautions: Fall Precaution Comments: has been orthostatic and symptomatic Restrictions Weight Bearing Restrictions:  Yes RLE Weight Bearing: Weight bearing as tolerated     Mobility  Bed Mobility Overal bed mobility: Needs Assistance Bed Mobility: Rolling;Supine to Sit;Sit to Supine Rolling: Min assist   Supine to sit: HOB elevated;Min assist Sit to supine: Min assist   General bed mobility comments: received/left in chair    Transfers Overall transfer level: Needs assistance Equipment used: Rolling walker (2 wheels) Transfers: Sit to/from Stand Sit to Stand: Min assist;Min guard           General transfer comment: verbal cues for hand placement    Ambulation/Gait Ambulation/Gait assistance: Min guard Gait Distance (Feet): 30 Feet Assistive device: Rolling walker (2 wheels) Gait Pattern/deviations: Step-to pattern;Decreased stance time - right Gait velocity: decreased     General Gait Details: Increased R hip discomfort due to increased mobility today   Stairs             Wheelchair Mobility    Modified Rankin (Stroke Patients Only)       Balance Overall balance assessment: Needs assistance Sitting-balance support: No upper extremity supported;Feet supported Sitting balance-Leahy Scale: Good Sitting balance - Comments:  (No dizziness in sitting)   Standing balance support: Bilateral upper extremity supported;Reliant on assistive device for balance Standing balance-Leahy Scale: Fair Standing balance comment:  (Able to tolerate 2 minutes of standing with minimal lightheadedness)                            Cognition Arousal/Alertness: Awake/alert Behavior During Therapy: WFL for tasks assessed/performed Overall Cognitive Status: Within Functional Limits for tasks assessed  General Comments: Pt following single step commands much better today.        Exercises Total Joint Exercises Ankle Circles/Pumps: AROM;10 reps;Both Long Arc Quad: AROM;Both;10 reps    General Comments General comments (skin  integrity, edema, etc.): Pt given information/education on PLB technique, O2 management, explanation of pt's surgery and remaining PT goals.      Pertinent Vitals/Pain Pain Assessment: 0-10 Pain Score: 5  Breathing: normal Pain Location: R hip with activity Pain Descriptors / Indicators: Discomfort;Grimacing Pain Intervention(s): Monitored during session    Home Living                          Prior Function            PT Goals (current goals can now be found in the care plan section) Acute Rehab PT Goals Patient Stated Goal: to return to independence Progress towards PT goals: Progressing toward goals    Frequency    BID      PT Plan Current plan remains appropriate    Co-evaluation              AM-PAC PT "6 Clicks" Mobility   Outcome Measure  Help needed turning from your back to your side while in a flat bed without using bedrails?: A Little Help needed moving from lying on your back to sitting on the side of a flat bed without using bedrails?: A Little Help needed moving to and from a bed to a chair (including a wheelchair)?: A Little Help needed standing up from a chair using your arms (e.g., wheelchair or bedside chair)?: A Little Help needed to walk in hospital room?: A Little Help needed climbing 3-5 steps with a railing? : A Lot 6 Click Score: 17    End of Session Equipment Utilized During Treatment: Gait belt;Oxygen Activity Tolerance: Patient tolerated treatment well Patient left: in chair;with call bell/phone within reach;with chair alarm set Nurse Communication: Mobility status PT Visit Diagnosis: Unsteadiness on feet (R26.81);Other abnormalities of gait and mobility (R26.89);Muscle weakness (generalized) (M62.81);History of falling (Z91.81);Difficulty in walking, not elsewhere classified (R26.2)     Time: 1287-8676 PT Time Calculation (min) (ACUTE ONLY): 24 min  Charges:  $Gait Training: 8-22 mins $Therapeutic Exercise: 8-22  mins $Therapeutic Activity: 8-22 mins                    Mikel Cella, PTA    Alexandra [name] 12/22/2021, 1:28 PM

## 2021-12-22 NOTE — TOC Progression Note (Signed)
Transition of Care [facility]) - Progression Note    Patient Details  Name: Alexandra [name] MRN: [id number] Date of Birth: May 20, 1941  [facility]) CM/SW [place], RN Phone Number: 12/22/2021, 1:30 PM  Clinical Narrative:   [facility] called with approval to go to [facility], the [facility] number is [id number], EMS approved for [facility] [id number], notified the patient         Expected Discharge Plan and Services                                                 Social Determinants of Health (SDOH) Interventions    Readmission Risk Interventions No flowsheet data found.

## 2021-12-22 NOTE — TOC Progression Note (Signed)
Transition of Care [facility]) - Progression Note    Patient Details  Name: Alexandra [name] MRN: [id number] Date of Birth: [birth year]  Transition of Care [facility]) CM/SW [place], RN Phone Number: 12/22/2021, 9:34 AM  Clinical Narrative:   [facility] Sayres to check the status of the insurance auth , She is going to go for Med review today, they are waiting on today's notes to see if she is doing any better with PT, will continue to follow up         Expected Discharge Plan and Services                                                 Social Determinants of Health (SDOH) Interventions    Readmission Risk Interventions No flowsheet data found.

## 2021-12-22 NOTE — Progress Notes (Signed)
Physical Therapy Treatment Patient Details Name: Alexandra [name] MRN: [id number] DOB: [birth year] Today's Date: 12/22/2021   History of Present Illness 81 y.o. female with medical history significant for depression, anxiety, chronic pain syndrome, hyperlipidemia, insomnia, neuropathy, who presents to the emergency department for chief concerns of fall. Imaging reveals displaced intertrochanteric fracture of the proximal right femur. Pt is now s/p R hip IM nail on 12/16/21.    PT Comments    Pt able to maintain standing posture for first time today without significant drop in BP or c/o dizziness. Pt able to complete 42ft of gait training with CGA and support of RW. Verbal cues for proper sequencing and use of sevice. Pt remained on 3L O2 via [place] with O2 sats at 95% upon completion, HR 116bpm. Good tolerance for functional mobility, husband present to assist with pushing chair behind patient. At this time pt would still benefit from short term SNF once medically stable since she has been limited with mobility progression due to BP issues. Continue PT per POC.   Recommendations for follow up therapy are one component of a multi-disciplinary discharge planning process, led by the attending physician.  Recommendations may be updated based on patient status, additional functional criteria and insurance authorization.  Follow Up Recommendations  Skilled nursing-short term rehab (<3 hours/day)     Assistance Recommended at Discharge Intermittent Supervision/Assistance  Patient can return home with the following A little help with walking and/or transfers;A little help with bathing/dressing/bathroom;Assist for transportation;Help with stairs or ramp for entrance   Equipment Recommendations  Rolling walker (2 wheels)    Recommendations for Other Services       Precautions / Restrictions Precautions Precautions: Fall Precaution Comments: has been orthostatic and symptomatic Restrictions Weight Bearing  Restrictions: Yes RLE Weight Bearing: Weight bearing as tolerated     Mobility  Bed Mobility               General bed mobility comments: received/left in chair    Transfers Overall transfer level: Needs assistance Equipment used: Rolling walker (2 wheels) Transfers: Sit to/from Stand Sit to Stand: Min assist           General transfer comment: verbal cues for hand placement    Ambulation/Gait Ambulation/Gait assistance: Min guard Gait Distance (Feet): 55 Feet Assistive device: Rolling walker (2 wheels) Gait Pattern/deviations: Step-to pattern;Decreased stance time - right       General Gait Details: First day tolerating gait training since BP has improved. Verbal cues for proper sequencing and safety awareness. Pt remained on 3L O2 sats remained at 95%   Stairs             Wheelchair Mobility    Modified Rankin (Stroke Patients Only)       Balance Overall balance assessment: Needs assistance Sitting-balance support: No upper extremity supported;Feet supported Sitting balance-Leahy Scale: Good Sitting balance - Comments:  (No dizziness in sitting)   Standing balance support: Bilateral upper extremity supported;Reliant on assistive device for balance Standing balance-Leahy Scale: Fair Standing balance comment:  (Able to tolerate 2 minutes of standing with minimal lightheadedness)                            Cognition Arousal/Alertness: Awake/alert Behavior During Therapy: WFL for tasks assessed/performed Overall Cognitive Status: Within Functional Limits for tasks assessed  General Comments: Pt following single step commands much better today.        Exercises Total Joint Exercises Ankle Circles/Pumps: AROM;10 reps;Both Long Arc Quad: AROM;Both;10 reps    General Comments General comments (skin integrity, edema, etc.): see vitals      Pertinent Vitals/Pain Pain Assessment:  0-10 Pain Score: 4  Breathing: normal Pain Location: R hip with activity Pain Descriptors / Indicators: Discomfort;Grimacing    Home Living                          Prior Function            PT Goals (current goals can now be found in the care plan section) Acute Rehab PT Goals Patient Stated Goal: to return to independence Progress towards PT goals: Progressing toward goals    Frequency    BID      PT Plan Current plan remains appropriate    Co-evaluation              AM-PAC PT "6 Clicks" Mobility   Outcome Measure  Help needed turning from your back to your side while in a flat bed without using bedrails?: A Little Help needed moving from lying on your back to sitting on the side of a flat bed without using bedrails?: A Little Help needed moving to and from a bed to a chair (including a wheelchair)?: A Little Help needed standing up from a chair using your arms (e.g., wheelchair or bedside chair)?: A Little Help needed to walk in hospital room?: A Little Help needed climbing 3-5 steps with a railing? : A Lot 6 Click Score: 17    End of Session Equipment Utilized During Treatment: Gait belt;Oxygen Activity Tolerance: Patient tolerated treatment well Patient left: in chair;with call bell/phone within reach;with chair alarm set Nurse Communication: Mobility status PT Visit Diagnosis: Unsteadiness on feet (R26.81);Other abnormalities of gait and mobility (R26.89);Muscle weakness (generalized) (M62.81);History of falling (Z91.81);Difficulty in walking, not elsewhere classified (R26.2)     Time: 1000-1055 PT Time Calculation (min) (ACUTE ONLY): 55 min  Charges:  $Gait Training: 8-22 mins $Therapeutic Exercise: 8-22 mins $Therapeutic Activity: 23-37 mins                    Mikel Cella, PTA    [name] 12/22/2021, 11:20 AM

## 2021-12-22 NOTE — Progress Notes (Signed)
PROGRESS NOTE    Alexandra [name]  IOE:[id number] DOB: 24-Oct-1941 DOA: 12/16/2021 PCP: Lynnell Jude, MD   Chief Complaint  Patient presents with   Fall   Leg Pain  Brief Narrative/Hospital Course: Alexandra [name], 81 y.o. [name], [name], insomnia, neuropathy presented after a fall, found to have right femoral fracture. She was admitted for further management. Patient underwent ORIF on 12/31 and working with PT OT and advising skilled nursing facility.  She has been orthostatic, hypoxic needing supplemental oxygen, IV fluids.   Subjective: Seen and examined this morning. On bedside chair on 3l [place], PT abt  to work No leg swelling No fever overnight, blood pressure 120s to 150s Labs with improving WBC count hemoglobin 9.7> 8.7 g  Assessment & Plan:  Right femoral neck fracture presumed to be due to mechanical fall: S/P ORIF 12/31st,. Cont PT OT, pain control bowel regimen, along with monitoring of voiding in and out cath if needed.  PT OT recommended skilled nursing facility.  Orthopedics on board.  On Lovenox for DVT prophylaxis.  Acute hypoxic respiratory failure postoperatively Elevated D-dimer-(no PE on CTA, no DVT and ultrasound 1/1) Right posterior basilar pneumonia likely aspiration pneumonia: Had persistent leukocytosis and Pro-Cal is elevated 0.5, started on Unasyn 1/4, WBC count improving, afebrile, on supplemental oxygen on 3 L-continue I-S ambulation and wean oxygen as tolerated.    Orthostatic hypotension dropped in 80s 1/4 given IV fluid bolus and normal saline x6 hours overnight this morning orthostatic vitals have resolved.  If recurs consider adding abdominal binder, cannot tolerate TED hose due to neuropathy and has hypertension up to 150s so risk of supine hypertension with midodrine.  Constipation:added laxatives as needed  Polypharmacy Anxiety/depression Insomnia: Previous attending had prolonged and extensive  discussion with family daughter and husband as well as patient, since he has had uncontrolled pain after back surgery 1997 and has been on very high dose of gabapentin and Ambien as well as narcotics.  These are likely contributory to her presentation, now gabapentin dropped to 80 mg 3 times daily, on Ambien 5 mg nightly and tolerating well.  Continue decreasing the dose slowly hopefully can come to acceptable level of gabapentin and eventually discontinue Ambien.  Advised follow-up with pain specialist  Possible urinary obstruction: needing In-N-Out cath, nursing to monitor postvoid residual encourage ambulation  GERD : on  PPI  Incidental right lower lobe and right upper lobe nodule pleural-based discussed with patient's daughter for pulmonary follow-up and CT imaging in 3 months  History of peripheral neuropathy with possible lumbar radiculopathy continue Neurontin at decreased dose of 800 3 times daily  Urticaria post morphine on Benadryl 12.5 mg every 8 hours as needed  Hyperlipidemia: On simvastatin  DVT prophylaxis: enoxaparin (LOVENOX) injection 40 mg Start: 12/18/21 0900 Place TED hose Start: 12/16/21 1844 Code Status:   Code Status: Full Code Family Communication: plan of care discussed with patient and her daughter Status is: Inpatient Remains inpatient appropriate because: For ongoing managed [place] hypoxic respiratory failure pneumonia and hip surgery Disposition: Currently not medically stable for discharge. Anticipated Disposition: SNF hopefully next 1 to 2 days  Total time spent in the care of this patient 35 MINUTES Objective: Vitals last 24 hrs: Vitals:   12/21/21 1941 12/21/21 2329 12/22/21 0331 12/22/21 0732  BP: 123/65 (!) 150/69 (!) 155/77 (!) 145/81  Pulse: (!) 102 92 85 (!) 106  Resp: 20 18 16 15   Temp: 98.5 F (36.9  C) 98.5 F (36.9 C) 97.9 F (36.6 C) 98.8 F (37.1 C)  TempSrc:      SpO2: 100% 97% 100% 97%  Weight:      Height:       Weight change:    Intake/Output Summary (Last 24 hours) at 12/22/2021 0951 Last data filed at 12/22/2021 3790 Gross per 24 hour  Intake 2039.93 ml  Output 550 ml  Net 1489.93 ml   Net IO Since Admission: -555.07 mL [12/22/21 0951]   Physical Examination: General exam: AAOx 3, weak appearing. HEENT:Oral mucosa moist, Ear/Nose WNL grossly, dentition normal. Respiratory system: Crackles in the right lung base, lung sounds clear and upper airways, , no use of accessory muscle Cardiovascular system: S1 & S2 +, No JVD. Gastrointestinal system: Abdomen soft,NT,ND,BS+ Nervous System:Alert, awake, moving extremities and grossly nonfocal Extremities: Right hip surgical site dressing in place C/D/I. Skin: No rashes,no icterus. MSK: Normal muscle bulk,tone, power   Medications reviewed:  Scheduled Meds:  amitriptyline  50 mg Oral QHS   celecoxib  200 mg Oral BID   Chlorhexidine Gluconate Cloth  6 each Topical Daily   enoxaparin (LOVENOX) injection  40 mg Subcutaneous Q24H   feeding supplement  237 mL Oral BID BM   gabapentin  800 mg Oral TID   guaiFENesin  600 mg Oral BID   latanoprost  1 drop Both Eyes QHS   lidocaine  2 patch Transdermal Q24H   multivitamin with minerals  1 tablet Oral Daily   pantoprazole  40 mg Oral QPC breakfast   sertraline  25 mg Oral Daily   simvastatin  40 mg Oral QPM   Continuous Infusions:  ampicillin-sulbactam (UNASYN) IV 3 g (12/22/21 0518)   Diet Order             Diet Heart Room service appropriate? Yes; Fluid consistency: Thin  Diet effective now                   Nutrition Problem: Increased nutrient needs Etiology: hip fracture, post-op healing Signs/Symptoms: estimated needs Interventions: Ensure Enlive (each supplement provides 350kcal and 20 grams of protein), MVI  Weight change:   Wt Readings from Last 3 Encounters:  12/16/21 70.8 kg  02/06/17 74.8 kg     Consultants:see note  Procedures:see note Antimicrobials: Anti-infectives (From admission,  onward)    Start     Dose/Rate Route Frequency Ordered Stop   12/21/21 1200  Ampicillin-Sulbactam (UNASYN) 3 g in sodium chloride 0.9 % 100 mL IVPB        3 g 200 mL/hr over 30 Minutes Intravenous Every 6 hours 12/21/21 1019     12/17/21 1158  ceFAZolin (ANCEF) 2-4 GM/100ML-% IVPB       Note to Pharmacy: Mirna Mires: cabinet override      12/17/21 1158 12/17/21 1408   12/17/21 1100  ceFAZolin (ANCEF) IVPB 2g/100 mL premix        2 g 200 mL/hr over 30 Minutes Intravenous To Surgery 12/16/21 2210 12/17/21 1438      Culture/Microbiology No results found for: SDES, SPECREQUEST, CULT, REPTSTATUS  Other culture-see note  Unresulted Labs (From admission, onward)     Start     Ordered   12/22/21 0500  Procalcitonin  Daily,   R     Question:  Specimen collection method  Answer:  Lab=Lab collect   12/21/21 2409          Data Reviewed: I have personally reviewed following labs and imaging studies CBC: Recent  Labs  Lab 12/16/21 [id number] 12/18/21 6283 12/19/21 0509 12/20/21 0521 12/21/21 0459 12/22/21 0456  WBC 14.7* 20.2* 20.1* 17.9* 16.4* 13.2*  NEUTROABS 12.4*  --   --   --   --   --   HGB 13.3 10.7* 10.2* 10.2* 9.7* 8.7*  HCT 40.9 32.2* 31.9* 31.7* 29.8* 27.5*  MCV 95.8 94.2 95.2 94.6 94.6 95.5  PLT 248 222 255 296 287 662   Basic Metabolic Panel: Recent Labs  Lab 12/18/21 0638 12/19/21 0509 12/20/21 0521 12/21/21 0459 12/22/21 0456  NA 133* 137 140 138 144  K 3.5 3.8 3.7 3.8 3.8  CL 97* 97* 99 96* 103  CO2 29 31 33* 33* 33*  GLUCOSE 130* 117* 119* 103* 98  BUN 18 23 24* 25* 19  CREATININE 0.68 0.79 0.75 0.78 0.60  CALCIUM 8.5* 8.8* 8.6* 8.3* 8.3*   GFR: Estimated Creatinine Clearance: 59.6 mL/min (by C-G formula based on SCr of 0.6 mg/dL). Liver Function Tests: Recent Labs  Lab 12/16/21 0942  AST 24  ALT 14  ALKPHOS 100  BILITOT 0.6  PROT 8.0  ALBUMIN 4.2   No results for input(s): LIPASE, AMYLASE in the last 168 hours. No results for  input(s): AMMONIA in the last 168 hours. Coagulation Profile: Recent Labs  Lab 12/16/21 1414  INR 1.0   Cardiac Enzymes: Recent Labs  Lab 12/16/21 1414  CKTOTAL 60   BNP (last 3 results) No results for input(s): PROBNP in the last 8760 hours. HbA1C: No results for input(s): HGBA1C in the last 72 hours. CBG: No results for input(s): GLUCAP in the last 168 hours. Lipid Profile: No results for input(s): CHOL, HDL, LDLCALC, TRIG, CHOLHDL, LDLDIRECT in the last 72 hours. Thyroid Function Tests: No results for input(s): TSH, T4TOTAL, FREET4, T3FREE, THYROIDAB in the last 72 hours. Anemia Panel: No results for input(s): VITAMINB12, FOLATE, FERRITIN, TIBC, IRON, RETICCTPCT in the last 72 hours. Sepsis Labs: Recent Labs  Lab 12/16/21 9476 12/16/21 1139 12/16/21 2147 12/21/21 0458 12/22/21 0456  PROCALCITON <0.10  --   --  0.52 0.52  LATICACIDVEN  --  1.2 1.5  --   --     Recent Results (from the past 240 hour(s))  Resp Panel by RT-PCR (Flu A&B, Covid) Nasopharyngeal Swab     Status: None   Collection Time: 12/16/21  2:14 PM   Specimen: Nasopharyngeal Swab; Nasopharyngeal(NP) swabs in vial transport medium  Result Value Ref Range Status   SARS Coronavirus 2 by RT PCR NEGATIVE NEGATIVE Final    Comment: (NOTE) SARS-CoV-2 target nucleic acids are NOT DETECTED.  The SARS-CoV-2 RNA is generally detectable in upper respiratory specimens during the acute phase of infection. The lowest concentration of SARS-CoV-2 viral copies this assay can detect is 138 copies/mL. A negative result does not preclude SARS-Cov-2 infection and should not be used as the sole basis for treatment or other patient management decisions. A negative result may occur with  improper specimen collection/handling, submission of specimen other than nasopharyngeal swab, presence of viral mutation(s) within the areas targeted by this assay, and inadequate number of viral copies(<138 copies/mL). A negative  result must be combined with clinical observations, patient history, and epidemiological information. The expected result is Negative.  Fact Sheet for Patients:  EntrepreneurPulse.com.au  Fact Sheet for Healthcare Providers:  IncredibleEmployment.be  This test is no t yet approved or cleared by the [place] FDA and  has been authorized for detection and/or diagnosis of SARS-CoV-2 by FDA under an Emergency Use  Authorization (EUA). This EUA will remain  in effect (meaning this test can be used) for the duration of the COVID-19 declaration under Section 564(b)(1) of the Act, 21 U.S.C.section 360bbb-3(b)(1), unless the authorization is terminated  or revoked sooner.       Influenza A by PCR NEGATIVE NEGATIVE Final   Influenza B by PCR NEGATIVE NEGATIVE Final    Comment: (NOTE) The Xpert Xpress SARS-CoV-2/FLU/RSV plus assay is intended as an aid in the diagnosis of influenza from Nasopharyngeal swab specimens and should not be used as a sole basis for treatment. Nasal washings and aspirates are unacceptable for Xpert Xpress SARS-CoV-2/FLU/RSV testing.  Fact Sheet for Patients: EntrepreneurPulse.com.au  Fact Sheet for Healthcare Providers: IncredibleEmployment.be  This test is not yet approved or cleared by the [place] FDA and has been authorized for detection and/or diagnosis of SARS-CoV-2 by FDA under an Emergency Use Authorization (EUA). This EUA will remain in effect (meaning this test can be used) for the duration of the COVID-19 declaration under Section 564(b)(1) of the Act, 21 U.S.C. section 360bbb-3(b)(1), unless the authorization is terminated or revoked.  Performed at [facility], [address] [place], [place] [address]   MRSA Next Gen by PCR, Nasal     Status: None   Collection Time: 12/17/21  2:30 AM   Specimen: Nasal Mucosa; Nasal Swab  Result Value Ref Range Status    MRSA by PCR Next Gen NOT DETECTED NOT DETECTED Final    Comment: (NOTE) The GeneXpert MRSA Assay (FDA approved for NASAL specimens only), is one component of a comprehensive MRSA colonization surveillance program. It is not intended to diagnose MRSA infection nor to guide or monitor treatment for MRSA infections. Test performance is not FDA approved in patients less than 32 years old. Performed at [facility], [address] [place], [place] [address]   Resp Panel by RT-PCR (Flu A&B, Covid) Nasopharyngeal Swab     Status: None   Collection Time: 12/21/21  1:00 PM   Specimen: Nasopharyngeal Swab; Nasopharyngeal(NP) swabs in vial transport medium  Result Value Ref Range Status   SARS Coronavirus 2 by RT PCR NEGATIVE NEGATIVE Final    Comment: (NOTE) SARS-CoV-2 target nucleic acids are NOT DETECTED.  The SARS-CoV-2 RNA is generally detectable in upper respiratory specimens during the acute phase of infection. The lowest concentration of SARS-CoV-2 viral copies this assay can detect is 138 copies/mL. A negative result does not preclude SARS-Cov-2 infection and should not be used as the sole basis for treatment or other patient management decisions. A negative result may occur with  improper specimen collection/handling, submission of specimen other than nasopharyngeal swab, presence of viral mutation(s) within the areas targeted by this assay, and inadequate number of viral copies(<138 copies/mL). A negative result must be combined with clinical observations, patient history, and epidemiological information. The expected result is Negative.  Fact Sheet for Patients:  EntrepreneurPulse.com.au  Fact Sheet for Healthcare Providers:  IncredibleEmployment.be  This test is no t yet approved or cleared by the [place] FDA and  has been authorized for detection and/or diagnosis of SARS-CoV-2 by FDA under an Emergency Use Authorization  (EUA). This EUA will remain  in effect (meaning this test can be used) for the duration of the COVID-19 declaration under Section 564(b)(1) of the Act, 21 U.S.C.section 360bbb-3(b)(1), unless the authorization is terminated  or revoked sooner.       Influenza A by PCR NEGATIVE NEGATIVE Final   Influenza B by PCR NEGATIVE  NEGATIVE Final    Comment: (NOTE) The Xpert Xpress SARS-CoV-2/FLU/RSV plus assay is intended as an aid in the diagnosis of influenza from Nasopharyngeal swab specimens and should not be used as a sole basis for treatment. Nasal washings and aspirates are unacceptable for Xpert Xpress SARS-CoV-2/FLU/RSV testing.  Fact Sheet for Patients: EntrepreneurPulse.com.au  Fact Sheet for Healthcare Providers: IncredibleEmployment.be  This test is not yet approved or cleared by the [place] FDA and has been authorized for detection and/or diagnosis of SARS-CoV-2 by FDA under an Emergency Use Authorization (EUA). This EUA will remain in effect (meaning this test can be used) for the duration of the COVID-19 declaration under Section 564(b)(1) of the Act, 21 U.S.C. section 360bbb-3(b)(1), unless the authorization is terminated or revoked.  Performed at [facility], [address] [place], [place] [address]      Radiology Studies: CT Angio Chest Pulmonary Embolism (PE) W or WO Contrast  Result Date: 12/20/2021 CLINICAL DATA:  Positive D-dimer level. EXAM: CT ANGIOGRAPHY CHEST WITH CONTRAST TECHNIQUE: Multidetector CT imaging of the chest was performed using the standard protocol during bolus administration of intravenous contrast. Multiplanar CT image reconstructions and MIPs were obtained to evaluate the vascular anatomy. CONTRAST:  67mL OMNIPAQUE IOHEXOL 350 MG/ML SOLN COMPARISON:  December 16, 2021. FINDINGS: Cardiovascular: Satisfactory opacification of the pulmonary arteries to the segmental level. No evidence of  pulmonary embolism. Normal heart size. No pericardial effusion. Atherosclerosis of thoracic aorta is noted without aneurysm or dissection. Mediastinum/Nodes: Esophagus is unremarkable. Thyroid gland appears normal. Right hilar adenopathy is noted with largest lymph node measuring 15 mm. 11 mm precarinal lymph node is noted. Lungs/Pleura: No pneumothorax is noted. New right lower lobe airspace opacity is noted concerning for pneumonia. Mild left basilar subsegmental atelectasis is noted. 12 x 8 mm irregular density is noted posteriorly in the right upper lobe best seen on image number 32 of series 6 which is not changed compared to prior exam. Upper Abdomen: No acute abnormality. Musculoskeletal: No chest wall abnormality. No acute or significant osseous findings. Review of the MIP images confirms the above findings. IMPRESSION: No definite evidence of pulmonary embolus. New right posterior basilar airspace opacity is noted concerning for aspiration pneumonia. Mild left basilar subsegmental atelectasis is noted. Stable 12 x 8 mm irregular density with pleural tail is noted posteriorly in right upper lobe. Consider one of the following in 3 months for both low-risk and high-risk individuals: (a) repeat chest CT, (b) follow-up PET-CT, or (c) tissue sampling. This recommendation follows the consensus statement: Guidelines for Management of Incidental Pulmonary Nodules Detected on CT Images: From the Fleischner Society 2017; Radiology 2017; 284:228-243. Enlarged right hilar and precarinal adenopathy is noted which may be inflammatory in etiology, but attention to this abnormality on follow-up imaging is recommended to rule out metastatic disease or malignancy. Aortic Atherosclerosis (ICD10-I70.0). Electronically Signed   By: Marijo Conception M.D.   On: 12/20/2021 18:08   DG Chest Port 1 View  Result Date: 12/20/2021 CLINICAL DATA:  Hypoxia.  COPD.  ORIF right femur. EXAM: PORTABLE CHEST 1 VIEW COMPARISON:  12/16/2021  FINDINGS: Interval development of mild right lower lobe airspace disease and small right effusion. Left lung remains clear. Heart size and vascularity normal.  Negative for heart failure IMPRESSION: Interval development of right lower lobe airspace disease and small right effusion. Probable pneumonia. Electronically Signed   By: Franchot Gallo M.D.   On: 12/20/2021 11:59     LOS: 6 days   Maren [place]  Lupita Leash, MD [facility]  12/22/2021, 9:51 AM

## 2021-12-23 LAB — CBC
HCT: 28.9 % — ABNORMAL LOW (ref 36.0–46.0)
Hemoglobin: 9.3 g/dL — ABNORMAL LOW (ref 12.0–15.0)
MCH: 30.2 pg (ref 26.0–34.0)
MCHC: 32.2 g/dL (ref 30.0–36.0)
MCV: 93.8 fL (ref 80.0–100.0)
Platelets: 364 10*3/uL (ref 150–400)
RBC: 3.08 MIL/uL — ABNORMAL LOW (ref 3.87–5.11)
RDW: 13.3 % (ref 11.5–15.5)
WBC: 11.3 10*3/uL — ABNORMAL HIGH (ref 4.0–10.5)
nRBC: 0 % (ref 0.0–0.2)

## 2021-12-23 LAB — PROCALCITONIN: Procalcitonin: 0.31 ng/mL

## 2021-12-23 NOTE — Progress Notes (Signed)
Physical Therapy Treatment Patient Details Name: Alexandra [name] MRN: [id number] DOB: [birth year] Today's Date: 12/23/2021   History of Present Illness 81 y.o. female with medical history significant for depression, anxiety, chronic pain syndrome, hyperlipidemia, insomnia, neuropathy, who presents to the emergency department for chief concerns of fall. Imaging reveals displaced intertrochanteric fracture of the proximal right femur. Pt is now s/p R hip IM nail on 12/16/21.    PT Comments    Pt received sitting in recliner, family in room, agreeable to therapy. Mobility is improving nicely; she progressed to SUP for STS and increased ambulation distance from 17ft to 56ft. She continues to require CGA during ambulation and use of RW. PT assisted pt to restroom after ambulation and returned to the recliner. Pt states she would like to d/c home rather tan SNF - PT discussed factors that need to be addressed prior to d/c home for pt safety. Stairs and car transfers are main concern - will address this afternoon. Would benefit from skilled PT to address above deficits and promote optimal return to PLOF.   Recommendations for follow up therapy are one component of a multi-disciplinary discharge planning process, led by the attending physician.  Recommendations may be updated based on patient status, additional functional criteria and insurance authorization.  Follow Up Recommendations  Skilled nursing-short term rehab (<3 hours/day)     Assistance Recommended at Discharge Intermittent Supervision/Assistance  Patient can return home with the following A little help with walking and/or transfers;A little help with bathing/dressing/bathroom;Assist for transportation;Help with stairs or ramp for entrance   Equipment Recommendations  Rolling walker (2 wheels)    Recommendations for Other Services       Precautions / Restrictions Precautions Precautions: Fall Precaution Comments: was orthostatic and  symptomatic - absent for 2 days Restrictions Weight Bearing Restrictions: Yes RLE Weight Bearing: Weight bearing as tolerated     Mobility  Bed Mobility               General bed mobility comments: received/left in chair    Transfers   Equipment used: Rolling walker (2 wheels) Transfers: Sit to/from Stand Sit to Stand: Supervision           General transfer comment: close SUP for safety    Ambulation/Gait Ambulation/Gait assistance: Min guard Gait Distance (Feet): 75 Feet Assistive device: Rolling walker (2 wheels) Gait Pattern/deviations: Step-through pattern;Decreased weight shift to right;Decreased stance time - right;Antalgic       General Gait Details: 24ft with CGA for safety. Step-to progressing to step-through.   Stairs             Wheelchair Mobility    Modified Rankin (Stroke Patients Only)       Balance Overall balance assessment: Needs assistance Sitting-balance support: No upper extremity supported;Feet supported Sitting balance-Leahy Scale: Good Sitting balance - Comments:  (No dizziness in sitting)   Standing balance support: Bilateral upper extremity supported;Reliant on assistive device for balance;During functional activity Standing balance-Leahy Scale: Fair Standing balance comment: Reliant during ambualtion however able to static stand without UE support and weightshift onto LLE. (Able to tolerate 2 minutes of standing with minimal lightheadedness)                            Cognition Arousal/Alertness: Awake/alert Behavior During Therapy: WFL for tasks assessed/performed Overall Cognitive Status: Within Functional Limits for tasks assessed  General Comments: Follows commands well        Exercises Other Exercises Other Exercises: Education to pt and family regarding desire to discharge home rather than SNF. Discussed therapy POC s/p d/c, safety/obstacles at home  such as STE, car transfer.    General Comments        Pertinent Vitals/Pain Pain Assessment: 0-10 Pain Score: 5  Pain Location: R hip with activity Pain Descriptors / Indicators: Discomfort;Grimacing Pain Intervention(s): Monitored during session;Limited activity within patient's tolerance    Home Living                          Prior Function            PT Goals (current goals can now be found in the care plan section) Acute Rehab PT Goals Patient Stated Goal: to return to independence    Frequency    BID      PT Plan      Co-evaluation              AM-PAC PT "6 Clicks" Mobility   Outcome Measure  Help needed turning from your back to your side while in a flat bed without using bedrails?: A Little Help needed moving from lying on your back to sitting on the side of a flat bed without using bedrails?: A Little Help needed moving to and from a bed to a chair (including a wheelchair)?: A Little Help needed standing up from a chair using your arms (e.g., wheelchair or bedside chair)?: A Little Help needed to walk in hospital room?: A Little Help needed climbing 3-5 steps with a railing? : A Little 6 Click Score: 18    End of Session Equipment Utilized During Treatment: Gait belt;Oxygen Activity Tolerance: Patient tolerated treatment well Patient left: in chair;with call bell/phone within reach;with family/visitor present Nurse Communication: Mobility status PT Visit Diagnosis: Unsteadiness on feet (R26.81);Other abnormalities of gait and mobility (R26.89);Muscle weakness (generalized) (M62.81);History of falling (Z91.81);Difficulty in walking, not elsewhere classified (R26.2)     Time: 6720-9470 PT Time Calculation (min) (ACUTE ONLY): 27 min  Charges:  $Gait Training: 8-22 mins $Therapeutic Activity: 8-22 mins                     Patrina Levering PT, DPT 12/23/21 12:37 PM [phone]

## 2021-12-23 NOTE — Progress Notes (Signed)
PROGRESS NOTE    Alexandra [name]  ERX:[id number] DOB: 1941-05-15 DOA: 12/16/2021 PCP: Alexandra Jude, MD   Chief Complaint  Patient presents with   Fall   Leg Pain  Brief Narrative/Hospital Course: Alexandra [name], 81 y.o. female with PMH of anxiety/depression with chronic pain syndrome, [name], insomnia, neuropathy presented after a fall, found to have right femoral fracture. She was admitted for further management. Patient underwent ORIF on 12/31 and working with PT OT and advising skilled nursing facility.  She has been orthostatic needing IV fluid with his orthostatics has resolved.  She had acute hypoxic spelt failure negative for PE although elevated D-dimer and right basilar pneumonia likely aspiration treated with IV antibiotic supplemental oxygen. Mobility also improving working with PT OT.   Subjective:  Feels well overall. on 1-2 l [place] She feels that she is getting better and actually wants to go home not SNF.   Assessment & Plan:  Right femoral neck fracture presumed to be due to mechanical fall: S/P ORIF 12/31st,. Cont PT OT, pain control bowel regimen.orthopedic has recommended Lovenox x14 days, follow-up in clinic in 2 weeks for staple removal  Acute hypoxic respiratory failure postoperatively Elevated D-dimer-(no PE on CTA, no DVT and ultrasound 1/1) Right posterior basilar pneumonia likely aspiration pneumonia: Leukocytosis and procalcitonin improving, continue with Unasyn supplemental oxygen and wean off to room air.  Encourage ambulation, incentive spirometry.    Orthostatic hypotension dropped in 80s on 1/4 given IV fluid bolus and normal saline x6 hours -resolved.If recurs consider adding abdominal binder, cannot tolerate TED hose due to neuropathy and has hypertension up to 150s so risk of supine hypertension with midodrine.  Constipation: cont laxatives as needed  Polypharmacy Anxiety/depression Insomnia: Previous attending had prolonged and extensive discussion  with family daughter and husband as well as patient, since he has had uncontrolled pain after back surgery 1997 and has been on very high dose of gabapentin and Ambien as well as narcotics.  These are likely contributory to her presentation, now gabapentin dropped to 800 mg 3 times daily, on Ambien 5 mg nightly-tolerating well.  Continue to follow-up with PCP outpatient and continue to wean her dose  Possible urinary obstruction: needing In-N-Out cath, nursing to monitor postvoid residual encourage ambulation  GERD : on  PPI  Incidental right lower lobe and right upper lobe nodule pleural-based discussed with patient's daughter for pulmonary follow-up and CT imaging in 3 months  History of peripheral neuropathy with possible lumbar radiculopathy continue Neurontin at decreased dose of 800mg  3 times daily  Urticaria post morphine on Benadryl 12.5 mg every 8 hours as needed  Hyperlipidemia: On simvastatin  DVT prophylaxis: enoxaparin (LOVENOX) injection 40 mg Start: 12/18/21 0900 Place TED hose Start: 12/16/21 1844 Code Status:   Code Status: Full Code Family Communication: plan of care discussed with patient and her daughter Status is: Inpatient Remains inpatient appropriate because: For ongoing managed [place] hypoxic respiratory failure pneumonia and hip surgery Disposition: Currently not medically stable for discharge. Anticipated Disposition: anticpate home w/ hh tomorrow after family education and further ptot. She declined SNF  Total time spent in the care of this patient 69 MINUTES Objective: Vitals last 24 hrs: Vitals:   12/22/21 2346 12/23/21 0343 12/23/21 0725 12/23/21 1125  BP: (!) 142/73 (!) 142/74 (!) 163/79 135/61  Pulse: 98 85 98 91  Resp: 14 15 17 17   Temp: 98.4 F (36.9 C) 97.8 F (36.6 C) 98.5 F (36.9 C) 98.3 F (36.8 C)  TempSrc:  SpO2: 93% 99% 96% 92%  Weight:      Height:       Weight change:   Intake/Output Summary (Last 24 hours) at 12/23/2021  1513 Last data filed at 12/23/2021 1431 Gross per 24 hour  Intake 522.84 ml  Output 500 ml  Net 22.84 ml    Net IO Since Admission: -412.23 mL [12/23/21 1513]   Physical Examination: General exam: AAOx 3, pleasant,  HEENT:Oral mucosa moist, Ear/Nose WNL grossly, dentition normal. Respiratory system: bilaterally diminished breath sounds at the base, clear upper lungs, no use of accessory muscle Cardiovascular system: S1 & S2 +, No JVD,. Gastrointestinal system: Abdomen soft,NT,ND, BS+ Nervous System:Alert, awake, moving extremities and grossly nonfocal Extremities: Right hip surgical site with dressing in place,NO edema Skin: No rashes,no icterus. MSK: Normal muscle bulk,tone, power   Medications reviewed:  Scheduled Meds:  amitriptyline  50 mg Oral QHS   celecoxib  200 mg Oral BID   Chlorhexidine Gluconate Cloth  6 each Topical Daily   enoxaparin (LOVENOX) injection  40 mg Subcutaneous Q24H   feeding supplement  237 mL Oral BID BM   gabapentin  800 mg Oral TID   guaiFENesin  600 mg Oral BID   latanoprost  1 drop Both Eyes QHS   lidocaine  2 patch Transdermal Q24H   multivitamin with minerals  1 tablet Oral Daily   pantoprazole  40 mg Oral QPC breakfast   sertraline  25 mg Oral Daily   simvastatin  40 mg Oral QPM   Continuous Infusions:  ampicillin-sulbactam (UNASYN) IV 3 g (12/23/21 1248)   Diet Order             Diet Heart Room service appropriate? Yes; Fluid consistency: Thin  Diet effective now                   Nutrition Problem: Increased nutrient needs Etiology: hip fracture, post-op healing Signs/Symptoms: estimated needs Interventions: Ensure Enlive (each supplement provides 350kcal and 20 grams of protein), MVI  Weight change:   Wt Readings from Last 3 Encounters:  12/16/21 70.8 kg  02/06/17 74.8 kg     Consultants:see note  Procedures:see note Antimicrobials: Anti-infectives (From admission, onward)    Start     Dose/Rate Route Frequency  Ordered Stop   12/21/21 1200  Ampicillin-Sulbactam (UNASYN) 3 g in sodium chloride 0.9 % 100 mL IVPB        3 g 200 mL/hr over 30 Minutes Intravenous Every 6 hours 12/21/21 1019     12/17/21 1158  ceFAZolin (ANCEF) 2-4 GM/100ML-% IVPB       Note to Pharmacy: Mirna Mires: cabinet override      12/17/21 1158 12/17/21 1408   12/17/21 1100  ceFAZolin (ANCEF) IVPB 2g/100 mL premix        2 g 200 mL/hr over 30 Minutes Intravenous To Surgery 12/16/21 2210 12/17/21 1438      Culture/Microbiology No results found for: SDES, [place], CULT, REPTSTATUS  Other culture-see note  Unresulted Labs (From admission, onward)     Start     Ordered   12/23/21 0500  CBC  Daily,   R     Question:  Specimen collection method  Answer:  Lab=Lab collect   12/22/21 1126          Data Reviewed: I have personally reviewed following labs and imaging studies CBC: Recent Labs  Lab 12/19/21 0509 12/20/21 0521 12/21/21 0459 12/22/21 0456 12/23/21 0703  WBC 20.1* 17.9* 16.4* 13.2* 11.3*  HGB 10.2* 10.2* 9.7* 8.7* 9.3*  HCT 31.9* 31.7* 29.8* 27.5* 28.9*  MCV 95.2 94.6 94.6 95.5 93.8  PLT 255 296 287 315 408    Basic Metabolic Panel: Recent Labs  Lab 12/18/21 0638 12/19/21 0509 12/20/21 0521 12/21/21 0459 12/22/21 0456  NA 133* 137 140 138 144  K 3.5 3.8 3.7 3.8 3.8  CL 97* 97* 99 96* 103  CO2 29 31 33* 33* 33*  GLUCOSE 130* 117* 119* 103* 98  BUN 18 23 24* 25* 19  CREATININE 0.68 0.79 0.75 0.78 0.60  CALCIUM 8.5* 8.8* 8.6* 8.3* 8.3*    GFR: Estimated Creatinine Clearance: 59.6 mL/min (by C-G formula based on SCr of 0.6 mg/dL). Liver Function Tests: No results for input(s): AST, ALT, ALKPHOS, BILITOT, PROT, ALBUMIN in the last 168 hours.  No results for input(s): LIPASE, AMYLASE in the last 168 hours. No results for input(s): AMMONIA in the last 168 hours. Coagulation Profile: No results for input(s): INR, PROTIME in the last 168 hours.  Cardiac Enzymes: No results for  input(s): CKTOTAL, CKMB, CKMBINDEX, TROPONINI in the last 168 hours.  BNP (last 3 results) No results for input(s): PROBNP in the last 8760 hours. HbA1C: No results for input(s): HGBA1C in the last 72 hours. CBG: No results for input(s): GLUCAP in the last 168 hours. Lipid Profile: No results for input(s): CHOL, HDL, LDLCALC, TRIG, CHOLHDL, LDLDIRECT in the last 72 hours. Thyroid Function Tests: No results for input(s): TSH, T4TOTAL, FREET4, T3FREE, THYROIDAB in the last 72 hours. Anemia Panel: No results for input(s): VITAMINB12, FOLATE, FERRITIN, TIBC, IRON, RETICCTPCT in the last 72 hours. Sepsis Labs: Recent Labs  Lab 12/16/21 2147 12/21/21 0458 12/22/21 0456 12/23/21 0703  PROCALCITON  --  0.52 0.52 0.31  LATICACIDVEN 1.5  --   --   --      Recent Results (from the past 240 hour(s))  Resp Panel by RT-PCR (Flu A&B, Covid) Nasopharyngeal Swab     Status: None   Collection Time: 12/16/21  2:14 PM   Specimen: Nasopharyngeal Swab; Nasopharyngeal(NP) swabs in vial transport medium  Result Value Ref Range Status   SARS Coronavirus 2 by RT PCR NEGATIVE NEGATIVE Final    Comment: (NOTE) SARS-CoV-2 target nucleic acids are NOT DETECTED.  The SARS-CoV-2 RNA is generally detectable in upper respiratory specimens during the acute phase of infection. The lowest concentration of SARS-CoV-2 viral copies this assay can detect is 138 copies/mL. A negative result does not preclude SARS-Cov-2 infection and should not be used as the sole basis for treatment or other patient management decisions. A negative result may occur with  improper specimen collection/handling, submission of specimen other than nasopharyngeal swab, presence of viral mutation(s) within the areas targeted by this assay, and inadequate number of viral copies(<138 copies/mL). A negative result must be combined with clinical observations, patient history, and epidemiological information. The expected result is  Negative.  Fact Sheet for Patients:  EntrepreneurPulse.com.au  Fact Sheet for Healthcare Providers:  IncredibleEmployment.be  This test is no t yet approved or cleared by the [place] FDA and  has been authorized for detection and/or diagnosis of SARS-CoV-2 by FDA under an Emergency Use Authorization (EUA). This EUA will remain  in effect (meaning this test can be used) for the duration of the COVID-19 declaration under Section 564(b)(1) of the Act, 21 U.S.C.section 360bbb-3(b)(1), unless the authorization is terminated  or revoked sooner.       Influenza A by PCR NEGATIVE NEGATIVE Final  Influenza B by PCR NEGATIVE NEGATIVE Final    Comment: (NOTE) The Xpert Xpress SARS-CoV-2/FLU/RSV plus assay is intended as an aid in the diagnosis of influenza from Nasopharyngeal swab specimens and should not be used as a sole basis for treatment. Nasal washings and aspirates are unacceptable for Xpert Xpress SARS-CoV-2/FLU/RSV testing.  Fact Sheet for Patients: EntrepreneurPulse.com.au  Fact Sheet for Healthcare Providers: IncredibleEmployment.be  This test is not yet approved or cleared by the [place] FDA and has been authorized for detection and/or diagnosis of SARS-CoV-2 by FDA under an Emergency Use Authorization (EUA). This EUA will remain in effect (meaning this test can be used) for the duration of the COVID-19 declaration under Section 564(b)(1) of the Act, 21 U.S.C. section 360bbb-3(b)(1), unless the authorization is terminated or revoked.  Performed at [facility], [address] [place], [place] [address]   MRSA Next Gen by PCR, Nasal     Status: None   Collection Time: 12/17/21  2:30 AM   Specimen: Nasal Mucosa; Nasal Swab  Result Value Ref Range Status   MRSA by PCR Next Gen NOT DETECTED NOT DETECTED Final    Comment: (NOTE) The GeneXpert MRSA Assay (FDA approved for NASAL  specimens only), is one component of a comprehensive MRSA colonization surveillance program. It is not intended to diagnose MRSA infection nor to guide or monitor treatment for MRSA infections. Test performance is not FDA approved in patients less than 64 years old. Performed at [facility], [address] [place], [place] [address]   Resp Panel by RT-PCR (Flu A&B, Covid) Nasopharyngeal Swab     Status: None   Collection Time: 12/21/21  1:00 PM   Specimen: Nasopharyngeal Swab; Nasopharyngeal(NP) swabs in vial transport medium  Result Value Ref Range Status   SARS Coronavirus 2 by RT PCR NEGATIVE NEGATIVE Final    Comment: (NOTE) SARS-CoV-2 target nucleic acids are NOT DETECTED.  The SARS-CoV-2 RNA is generally detectable in upper respiratory specimens during the acute phase of infection. The lowest concentration of SARS-CoV-2 viral copies this assay can detect is 138 copies/mL. A negative result does not preclude SARS-Cov-2 infection and should not be used as the sole basis for treatment or other patient management decisions. A negative result may occur with  improper specimen collection/handling, submission of specimen other than nasopharyngeal swab, presence of viral mutation(s) within the areas targeted by this assay, and inadequate number of viral copies(<138 copies/mL). A negative result must be combined with clinical observations, patient history, and epidemiological information. The expected result is Negative.  Fact Sheet for Patients:  EntrepreneurPulse.com.au  Fact Sheet for Healthcare Providers:  IncredibleEmployment.be  This test is no t yet approved or cleared by the [place] FDA and  has been authorized for detection and/or diagnosis of SARS-CoV-2 by FDA under an Emergency Use Authorization (EUA). This EUA will remain  in effect (meaning this test can be used) for the duration of the COVID-19 declaration under  Section 564(b)(1) of the Act, 21 U.S.C.section 360bbb-3(b)(1), unless the authorization is terminated  or revoked sooner.       Influenza A by PCR NEGATIVE NEGATIVE Final   Influenza B by PCR NEGATIVE NEGATIVE Final    Comment: (NOTE) The Xpert Xpress SARS-CoV-2/FLU/RSV plus assay is intended as an aid in the diagnosis of influenza from Nasopharyngeal swab specimens and should not be used as a sole basis for treatment. Nasal washings and aspirates are unacceptable for Xpert Xpress SARS-CoV-2/FLU/RSV testing.  Fact Sheet for Patients: EntrepreneurPulse.com.au  Fact Sheet for Healthcare Providers: IncredibleEmployment.be  This test is not yet approved or cleared by the [place] FDA and has been authorized for detection and/or diagnosis of SARS-CoV-2 by FDA under an Emergency Use Authorization (EUA). This EUA will remain in effect (meaning this test can be used) for the duration of the COVID-19 declaration under Section 564(b)(1) of the Act, 21 U.S.C. section 360bbb-3(b)(1), unless the authorization is terminated or revoked.  Performed at [facility], [address] [place], [place] [address]       Radiology Studies: No results found.   LOS: 7 days   Antonieta Pert, MD [facility]  12/23/2021, 3:13 PM

## 2021-12-23 NOTE — Progress Notes (Signed)
Pharmacy Antibiotic Note  Alexandra [name] is a 81 y.o. female admitted on 12/16/2021 with aspiration pneumonia.  Pharmacy has been consulted for unasyn dosing.  S/p ORIF 12/31   Day 3 of antibiotics  Plan: Per MD, would like to continue Unasyn 3 gm IV q6h    Height: 5\' 9"  (175.3 cm) Weight: 70.8 kg (156 lb) IBW/kg (Calculated) : 66.2  Temp (24hrs), Avg:98.4 F (36.9 C), Min:97.8 F (36.6 C), Max:98.7 F (37.1 C)  Recent Labs  Lab 12/16/21 2147 12/18/21 3704 12/18/21 8889 12/19/21 0509 12/20/21 0521 12/21/21 0459 12/22/21 0456 12/23/21 0703  WBC  --  20.2*   < > 20.1* 17.9* 16.4* 13.2* 11.3*  CREATININE  --  0.68  --  0.79 0.75 0.78 0.60  --   LATICACIDVEN 1.5  --   --   --   --   --   --   --    < > = values in this interval not displayed.     Estimated Creatinine Clearance: 59.6 mL/min (by C-G formula based on SCr of 0.6 mg/dL).    Allergies  Allergen Reactions   Avelox [Moxifloxacin Hcl] Shortness Of Breath and Swelling   Elemental Sulfur Other (See Comments)    Reaction as a child and not sure what.     Antimicrobials this admission: Unasyn 1/4   >>    Microbiology results: 12/31 MRSA PCR: neg  Thank you for allowing pharmacy to be a part of this patients care.  Patric Buckhalter A Aily Tzeng 12/23/2021 1:48 PM

## 2021-12-23 NOTE — TOC Progression Note (Signed)
Transition of Care [facility]) - Progression Note    Patient Details  Name: Alexandra [name] MRN: [id number] Date of Birth: [birth year]  [facility] CM/SW [place], RN Phone Number: 12/23/2021, 3:58 PM  Clinical Narrative:   The patient was working with PT and did complete the stairs, she will do stairs tomorrow with family as well, she would like to go home instead of SNF will need Home health, if the final decision is home after working with PT tomorrow         Expected Discharge Plan and Services                                                 Social Determinants of Health (SDOH) Interventions    Readmission Risk Interventions No flowsheet data found.

## 2021-12-23 NOTE — Care Management Important Message (Signed)
Important Message  Patient Details  Name: Alexandra [name] MRN: [id number] Date of Birth: [birth year]   Medicare Important Message Given:  Yes     Juliann Pulse A Syliva Mee 12/23/2021, 11:16 AM

## 2021-12-23 NOTE — Progress Notes (Signed)
Occupational Therapy Treatment Patient Details Name: Alexandra [name] MRN: [id number] DOB: Apr 15, 1941 Today's Date: 12/23/2021   History of present illness 81 y.o. female with medical history significant for depression, anxiety, chronic pain syndrome, hyperlipidemia, insomnia, neuropathy, who presents to the emergency department for chief concerns of fall. Imaging reveals displaced intertrochanteric fracture of the proximal right femur. Pt is now s/p R hip IM nail on 12/16/21.   OT comments  Ms Tejera was seen for OT treatment on this date. Upon arrival to room pt seated in chair with PT, agreeable to tx. Pt requires SUPERVISION for standing grooming tasks - cues for ECS. Independent don/doff L sock in sitting, MOD A don/doff R sock. SBA + RW sit<>stand with good standing tolerance. Pt making good progress toward goals. Pt continues to benefit from skilled OT services to maximize return to PLOF and minimize risk of future falls, injury, caregiver burden, and readmission. Will continue to follow POC. Discharge recommendation remains appropriate.     Recommendations for follow up therapy are one component of a multi-disciplinary discharge planning process, led by the attending physician.  Recommendations may be updated based on patient status, additional functional criteria and insurance authorization.    Follow Up Recommendations  Home health OT    Assistance Recommended at Discharge Frequent or constant Supervision/Assistance  Patient can return home with the following  A little help with walking and/or transfers;A little help with bathing/dressing/bathroom;Assistance with cooking/housework;Help with stairs or ramp for entrance   Equipment Recommendations  BSC/3in1    Recommendations for Other Services      Precautions / Restrictions Precautions Precautions: Fall Restrictions Weight Bearing Restrictions: Yes RLE Weight Bearing: Weight bearing as tolerated       Mobility Bed Mobility                General bed mobility comments: received and left in chair    Transfers Overall transfer level: Needs assistance Equipment used: Rolling walker (2 wheels) Transfers: Sit to/from Stand Sit to Stand: Supervision                 Balance Overall balance assessment: Needs assistance Sitting-balance support: No upper extremity supported;Feet supported Sitting balance-Leahy Scale: Good     Standing balance support: No upper extremity supported;During functional activity Standing balance-Leahy Scale: Fair                             ADL either performed or assessed with clinical judgement   ADL Overall ADL's : Needs assistance/impaired                                       General ADL Comments: SUPERVISION for standing grooming tasks - cues for ECS. Independent don/doff L sock in sitting, MOD A don/doff R sock. SBA + RW for ADL t/f      Cognition Arousal/Alertness: Awake/alert Behavior During Therapy: WFL for tasks assessed/performed Overall Cognitive Status: Within Functional Limits for tasks assessed                                 General Comments: Follows commands well                     Pertinent Vitals/ Pain       Pain Assessment: Faces Faces  Pain Scale: Hurts a little bit Pain Location: R hip with activity Pain Descriptors / Indicators: Discomfort;Grimacing Pain Intervention(s): Limited activity within patient's tolerance;Premedicated before session;Repositioned         Frequency  Min 2X/week        Progress Toward Goals  OT Goals(current goals can now be found in the care plan section)  Progress towards OT goals: Progressing toward goals  Acute Rehab OT Goals Patient Stated Goal: to go home OT Goal Formulation: With patient Time For Goal Achievement: 01/01/22 Potential to Achieve Goals: Good ADL Goals Pt Will Perform Grooming: with modified independence;sitting Pt Will Perform  Lower Body Dressing: with min assist;sitting/lateral leans Pt Will Transfer to Toilet: with min assist;stand pivot transfer;bedside commode  Plan Frequency remains appropriate;Discharge plan needs to be updated    Co-evaluation                 AM-PAC OT "6 Clicks" Daily Activity     Outcome Measure   Help from another person eating meals?: None Help from another person taking care of personal grooming?: A Little Help from another person toileting, which includes using toliet, bedpan, or urinal?: A Little Help from another person bathing (including washing, rinsing, drying)?: A Little Help from another person to put on and taking off regular upper body clothing?: None Help from another person to put on and taking off regular lower body clothing?: A Lot 6 Click Score: 19    End of Session Equipment Utilized During Treatment: Oxygen;Rolling walker (2 wheels)  OT Visit Diagnosis: Other abnormalities of gait and mobility (R26.89);Muscle weakness (generalized) (M62.81)   Activity Tolerance Patient tolerated treatment well;Treatment limited secondary to medical complications (Comment)   Patient Left with call bell/phone within reach;in chair;with chair alarm set   Nurse Communication          Time: 4098-1191 OT Time Calculation (min): 10 min  Charges: OT General Charges $OT Visit: 1 Visit OT Treatments $Self Care/Home Management : 8-22 mins  Dessie Coma, M.S. OTR/L  12/23/21, 4:28 PM  ascom [phone]

## 2021-12-23 NOTE — Progress Notes (Addendum)
Physical Therapy Treatment Patient Details Name: Alexandra [name] MRN: [id number] DOB: 03-May-1941 Today's Date: 12/23/2021   History of Present Illness 81 y.o. female with medical history significant for depression, anxiety, chronic pain syndrome, hyperlipidemia, insomnia, neuropathy, who presents to the emergency department for chief concerns of fall. Imaging reveals displaced intertrochanteric fracture of the proximal right femur. Pt is now s/p R hip IM nail on 12/16/21.    PT Comments    Pt received sitting in recliner, CNA taking vitals, pt agreeable to therapy. Pt received pain meds ~30 minutes before session. Pt transported to rehab gym in w/c. She navigated 4 steps using RW - ascended going backwards and descended forwards. CGA +2 required for pt safety and comfort - 1 therapist to stabilize RW and 1 therapist to stabilize pt. Family training set up for tomorrow morning. Pt then ambulated 77ft before returning to recliner in room. Car transfer was reviewed. Will need to be reviewed again with family. Due to pt progress, PT has changed d/c recs from SNF to home with HHPT. Would benefit from skilled PT to address above deficits and promote optimal return to PLOF.      Recommendations for follow up therapy are one component of a multi-disciplinary discharge planning process, led by the attending physician.  Recommendations may be updated based on patient status, additional functional criteria and insurance authorization.  Follow Up Recommendations  Home health PT     Assistance Recommended at Discharge Intermittent Supervision/Assistance  Patient can return home with the following A little help with walking and/or transfers;A little help with bathing/dressing/bathroom;Assist for transportation;Help with stairs or ramp for entrance   Equipment Recommendations  Rolling walker (2 wheels);BSC/3in1    Recommendations for Other Services       Precautions / Restrictions Precautions Precautions:  Fall Restrictions Weight Bearing Restrictions: Yes RLE Weight Bearing: Weight bearing as tolerated     Mobility  Bed Mobility               General bed mobility comments: received and left in chair    Transfers Overall transfer level: Needs assistance Equipment used: Rolling walker (2 wheels) Transfers: Sit to/from Stand Sit to Stand: Supervision           General transfer comment: close SUP for safety    Ambulation/Gait Ambulation/Gait assistance: Min guard Gait Distance (Feet): 65 Feet Assistive device: Rolling walker (2 wheels) Gait Pattern/deviations: Step-through pattern;Decreased weight shift to right;Decreased stance time - right;Antalgic       General Gait Details: 39ft + 25ft. CGA for safety using RW.   Stairs Stairs: Yes Stairs assistance: Min guard;+2 safety/equipment Stair Management: No rails;With walker;Backwards;Forwards;Step to pattern Number of Stairs: 4 General stair comments: No railing, used RW. CGA +2 for safety - 1 person stabilizing RW, 1 person stabilizing pt. Ascend backward, descend forward.   Wheelchair Mobility    Modified Rankin (Stroke Patients Only)       Balance Overall balance assessment: Needs assistance Sitting-balance support: No upper extremity supported;Feet supported Sitting balance-Leahy Scale: Good     Standing balance support: No upper extremity supported;During functional activity Standing balance-Leahy Scale: Fair Standing balance comment: Reliant during ambualtion however able to static stand without UE support and weightshift onto LLE.                            Cognition Arousal/Alertness: Awake/alert Behavior During Therapy: WFL for tasks assessed/performed Overall Cognitive Status: Within Functional Limits for tasks  assessed                                 General Comments: Follows commands well        Exercises Other Exercises Other Exercises: Education to pt  regarding STE, car transfer. Spoke to pt family on phone to set up time for family education.    General Comments        Pertinent Vitals/Pain Pain Assessment: Faces Faces Pain Scale: Hurts a little bit Pain Location: R hip with activity Pain Descriptors / Indicators: Discomfort;Grimacing Pain Intervention(s): Limited activity within patient's tolerance;Premedicated before session;Repositioned    Home Living                          Prior Function            PT Goals (current goals can now be found in the care plan section) Acute Rehab PT Goals Patient Stated Goal: to return to independence    Frequency    BID      PT Plan      Co-evaluation              AM-PAC PT "6 Clicks" Mobility   Outcome Measure  Help needed turning from your back to your side while in a flat bed without using bedrails?: A Little Help needed moving from lying on your back to sitting on the side of a flat bed without using bedrails?: A Little Help needed moving to and from a bed to a chair (including a wheelchair)?: A Little Help needed standing up from a chair using your arms (e.g., wheelchair or bedside chair)?: A Little Help needed to walk in hospital room?: A Little Help needed climbing 3-5 steps with a railing? : A Lot 6 Click Score: 17    End of Session Equipment Utilized During Treatment: Gait belt;Oxygen Activity Tolerance: Patient tolerated treatment well Patient left: in chair;with call bell/phone within reach;Other (comment) (handoff to OT) Nurse Communication: Mobility status PT Visit Diagnosis: Unsteadiness on feet (R26.81);Other abnormalities of gait and mobility (R26.89);Muscle weakness (generalized) (M62.81);History of falling (Z91.81);Difficulty in walking, not elsewhere classified (R26.2)     Time: 6010-9323 PT Time Calculation (min) (ACUTE ONLY): 42 min  Charges:  $Therapeutic Exercise: 8-22 mins $Therapeutic Activity: 23-37 mins                      Patrina Levering PT, DPT 12/23/21 4:47 PM [phone]

## 2021-12-24 LAB — CBC
HCT: 27.5 % — ABNORMAL LOW (ref 36.0–46.0)
Hemoglobin: 9 g/dL — ABNORMAL LOW (ref 12.0–15.0)
MCH: 30.8 pg (ref 26.0–34.0)
MCHC: 32.7 g/dL (ref 30.0–36.0)
MCV: 94.2 fL (ref 80.0–100.0)
Platelets: 417 10*3/uL — ABNORMAL HIGH (ref 150–400)
RBC: 2.92 MIL/uL — ABNORMAL LOW (ref 3.87–5.11)
RDW: 12.9 % (ref 11.5–15.5)
WBC: 10.8 10*3/uL — ABNORMAL HIGH (ref 4.0–10.5)
nRBC: 0 % (ref 0.0–0.2)

## 2021-12-24 MED ORDER — AMOXICILLIN-POT CLAVULANATE 875-125 MG PO TABS: 1.0000 | ORAL_TABLET | Freq: Two times a day (BID) | ORAL | 0 refills | Status: AC

## 2021-12-24 MED ORDER — ZOLPIDEM TARTRATE 10 MG PO TABS: 5.0000 mg | ORAL_TABLET | Freq: Every day | ORAL | 0 refills | Status: AC

## 2021-12-24 MED ORDER — ENSURE ENLIVE PO LIQD: 237.0000 mL | Freq: Two times a day (BID) | ORAL | 0 refills | Status: AC

## 2021-12-24 MED ORDER — ENOXAPARIN SODIUM 40 MG/0.4ML IJ SOSY: 40.0000 mg | PREFILLED_SYRINGE | INTRAMUSCULAR | 0 refills | Status: DC

## 2021-12-24 MED ORDER — GABAPENTIN 400 MG PO CAPS: 800.0000 mg | ORAL_CAPSULE | Freq: Three times a day (TID) | ORAL | 0 refills | Status: DC

## 2021-12-24 MED ORDER — SODIUM CHLORIDE 0.9 % IV SOLN: INTRAVENOUS | Status: DC | PRN

## 2021-12-24 MED ORDER — ADULT MULTIVITAMIN W/MINERALS CH: 1.0000 | ORAL_TABLET | Freq: Every day | ORAL | 0 refills | Status: AC

## 2021-12-24 MED ORDER — AMOXICILLIN-POT CLAVULANATE 875-125 MG PO TABS
1.0000 | ORAL_TABLET | Freq: Two times a day (BID) | ORAL | Status: DC
  Administered 2021-12-24: 1 via ORAL
  Filled 2021-12-24: qty 1

## 2021-12-24 NOTE — TOC Transition Note (Addendum)
Transition of Care [facility]) - CM/SW Discharge Note   Patient Details  Name: Alexandra [name] MRN: [id number] Date of Birth: [birth year]  [facility] CM/SW Contact:  Magnus Ivan, LCSW Phone Number: 12/24/2021, 11:31 AM   Clinical Narrative:    Notified patient will DC home today. Needs home o2. CSW notified Corene Cornea with [facility] of DC today. Referral made to [facility] with [employer] for home o2. Jasmine stated o2 as well as RW and 3 in 1 that were previously ordered will be delivered to bedside prior to patient being DC today.  3:15- Notified by LPN that [employer] delivered a rollator not a rolling walker as ordered. Called Jasmine with [employer] who says she will have a driver come swap it out for the right equipment, will be within 2 hours.  Final next level of care: [place] Barriers to Discharge: Barriers Resolved   Patient Goals and CMS Choice Patient states their goals for this hospitalization and ongoing recovery are:: home with home health CMS Medicare.gov Compare Post Acute Care list provided to:: Patient Choice offered to / list presented to : Patient  Discharge Placement                       Discharge Plan and Services                DME Arranged: Oxygen, Walker rolling, 3-N-1 DME Agency: [employer] Date DME Agency Contacted: 12/24/21   Representative spoke with at DME Agency: [place]: PT, OT [place] Agency: [place] ([place]) Date [place]: 12/24/21   Representative spoke with at [place]: [place] ([place]) Interventions     Readmission Risk Interventions No flowsheet data found.

## 2021-12-24 NOTE — Progress Notes (Signed)
Physical Therapy Treatment Patient Details Name: Alexandra [name] MRN: [id number] DOB: 1941/04/05 Today's Date: 12/24/2021   History of Present Illness 81 y.o. female with medical history significant for depression, anxiety, chronic pain syndrome, hyperlipidemia, insomnia, neuropathy, who presents to the emergency department for chief concerns of fall. Imaging reveals displaced intertrochanteric fracture of the proximal right femur. Pt is now s/p R hip IM nail on 12/16/21.    PT Comments    Pt was sitting in recliner with supportive son and spouse present. She was on 1 L o2 with sao2 88%. Author removed O2 during session to see if pt will need at home. During gait training she desaturates to 86% on room air. Recovers to 92% when 2 L o2 applied. MD/RN made aware that pt will require O2 at DC. During session pt demonstrated safe ability to ascend/descend step to simulate home entry and demonstrated all requirements to safely DC home with HHPT. She will continue to benefit from skilled PT at DC to address deficits while maximizing independence with ADLs.     Recommendations for follow up therapy are one component of a multi-disciplinary discharge planning process, led by the attending physician.  Recommendations may be updated based on patient status, additional functional criteria and insurance authorization.  Follow Up Recommendations  Home health PT     Assistance Recommended at Discharge Intermittent Supervision/Assistance  Patient can return home with the following A little help with walking and/or transfers;A little help with bathing/dressing/bathroom;Assist for transportation;Help with stairs or ramp for entrance   Equipment Recommendations  Rolling walker (2 wheels);BSC/3in1       Precautions / Restrictions Precautions Precautions: Fall Restrictions Weight Bearing Restrictions: Yes RLE Weight Bearing: Weight bearing as tolerated     Mobility  Bed Mobility      General bed mobility  comments: In recliner pre/post session    Transfers Overall transfer level: Needs assistance Equipment used: Rolling walker (2 wheels) Transfers: Sit to/from Stand Sit to Stand: Supervision           General transfer comment: Pt was easily able to STS form recliner without physical assistance or vcs    Ambulation/Gait Ambulation/Gait assistance: Supervision Gait Distance (Feet): 50 Feet Assistive device: Rolling walker (2 wheels) Gait Pattern/deviations: Step-through pattern;Decreased weight shift to right;Decreased stance time - right;Antalgic Gait velocity: decreased     General Gait Details: Pt attempted ambulation without O2 however destaurates to 86%. With 2 L o2 was able to maintain > 92% sao2.   Stairs Stairs: Yes Stairs assistance: Supervision Stair Management: No rails;Step to pattern;Forwards;With walker Number of Stairs: 1 General stair comments: Pt was able to ascend/descend step without rails with use of RW.      Balance Overall balance assessment: Needs assistance Sitting-balance support: No upper extremity supported;Feet supported Sitting balance-Leahy Scale: Good     Standing balance support: No upper extremity supported;During functional activity Standing balance-Leahy Scale: Fair Standing balance comment: Reliant during ambualtion however able to static stand without UE support and weightshift onto LLE.       Cognition Arousal/Alertness: Awake/alert Behavior During Therapy: WFL for tasks assessed/performed Overall Cognitive Status: Within Functional Limits for tasks assessed        General Comments: Pt is A and O x 4. does voice concerns about performing stairs backwards. After lengthy discussion, spouse/son and pt inform author that her steps are wide enough for RW to fit on each step.           General Comments General comments (  skin integrity, edema, etc.): reviewed HEP and pt states and demonstartes understanding.      Pertinent  Vitals/Pain Pain Assessment: 0-10 Pain Score: 2  Faces Pain Scale: Hurts a little bit Pain Intervention(s): Limited activity within patient's tolerance;Monitored during session;Premedicated before session     PT Goals (current goals can now be found in the care plan section) Acute Rehab PT Goals Patient Stated Goal: to return to independence Progress towards PT goals: Progressing toward goals    Frequency    BID      PT Plan Current plan remains appropriate    Co-evaluation     PT goals addressed during session: Mobility/safety with mobility;Balance;Proper use of DME;Strengthening/ROM        AM-PAC PT "6 Clicks" Mobility   Outcome Measure  Help needed turning from your back to your side while in a flat bed without using bedrails?: A Little Help needed moving from lying on your back to sitting on the side of a flat bed without using bedrails?: A Little Help needed moving to and from a bed to a chair (including a wheelchair)?: A Little Help needed standing up from a chair using your arms (e.g., wheelchair or bedside chair)?: A Little Help needed to walk in hospital room?: A Little Help needed climbing 3-5 steps with a railing? : A Little 6 Click Score: 18    End of Session Equipment Utilized During Treatment: Gait belt;Oxygen Activity Tolerance: Patient tolerated treatment well Patient left: in chair;with call bell/phone within reach;Other (comment) Nurse Communication: Mobility status PT Visit Diagnosis: Unsteadiness on feet (R26.81);Other abnormalities of gait and mobility (R26.89);Muscle weakness (generalized) (M62.81);History of falling (Z91.81);Difficulty in walking, not elsewhere classified (R26.2)     Time: 1505-6979 PT Time Calculation (min) (ACUTE ONLY): 14 min  Charges:  $Gait Training: 8-22 mins                     Julaine Fusi PTA 12/24/21, 11:35 AM

## 2021-12-24 NOTE — TOC Progression Note (Addendum)
Transition of Care [facility]) - Progression Note    Patient Details  Name: Alexandra [name] MRN: [id number] Date of Birth: 1941-09-19  Transition of Care [facility]) CM/SW [place], LCSW Phone Number: 12/24/2021, 10:37 AM  Clinical Narrative:   Spoke with patient regarding change in plans to home with home health. Patient confirmed her home address. Patient denies [facility] agency preference. Referral made to [facility] with [facility]         Expected Discharge Plan and Services                                                 Social Determinants of Health (SDOH) Interventions    Readmission Risk Interventions No flowsheet data found.

## 2021-12-24 NOTE — Discharge Summary (Signed)
Physician Discharge Summary  [name] ESP:[id number] DOB: 1941-04-21 DOA: 12/16/2021  PCP: Alexandra Jude, MD  Admit date: 12/16/2021 Discharge date: 12/24/2021  Admitted From: Home  Disposition:  Home   Recommendations for Outpatient Follow-up and new medication changes:  Follow up with Dr. Clemmie Krill in 7 to 10 days.  Follow up chest CT in 3 mo to follow up on pulmonary nodules.  Continue antibiotic therapy with Augmentin for 3 more days.  Decreased ambien to 5 mg as needed for sleep.   Home Health: yes   Equipment/Devices: walker    Discharge Condition: stable  CODE STATUS: full  Diet recommendation:  heart healthy   Brief/Interim Summary: Alexandra [name] was admitted to the hospital with the working diagnosis of right femoral neck fracture. Complicated with right lower lobe aspiration pneumonia.    81 yo female with the past medical history of depression, anxiety, chronic pain syndrome, dyslipidemia, insomnia and neuropathy who presented after a mechanical fall. Apparently she fell while walking into the kitchen, at 1 am. She did not recall the mechanism of the fall. On her initial physical examination her blood pressure was 140/72, HR 103, RR 20, temp 97,8 and oxygen saturation 93% on room air.  Lungs were clear to auscultation, heart with S1 and S2 present, abdomen soft and no lower extremity edema.    Sodium 138, potassium 4.0, chloride 98, bicarb 31, glucose 134, BUN 14, creatinine 0.75, white count 14.7, hemoglobin 13.3, hematocrit 40.9, platelets 248. SARS COVID-19 negative.   Urine analysis specific gravity 1.013, 0-5 white cells.   CT head/cervical spine no acute changes.   Right femur radiograph with displaced intertrochanteric fracture of the proximal right femur.    12/31 underwent open reduction with internal fixation of right femur.  Developed orthostatic hypotension that improved with IV fluids.    01/03 worsening dyspnea, CT chest negative for pulmonary embolism but  positive for right lower lobe infiltrate.  Placed on antibiotic therapy.    Patient inially plan to go to SNF, then clinically improved and plan for dc home with home health services.   Right femoral neck fracture.  Patient was admitted to the medical ward, underwent open reduction internal fixation. Pain controlled and DVT prophylaxis. Patient continue outpatient physical therapy and Occupational Therapy. Weight-bear as tolerated right lower extremity.  Recommend to continue enoxaparin for 14 more days for DVT prophylaxis.  Follow-up with orthopedics in 2 weeks for staple removal.  2.  Acute hypoxic respiratory failure, postoperative right lower lobe aspiration pneumonia.  Patient was diagnosed with right lower lobe pneumonia per CT chest. Negative for pulmonary embolism. Patient received antibiotic therapy with Unasyn, now transition to Augmentin. Her oxygenation improved.  3.  Orthostatic hypotension.  Improved with intravenous fluids.  Patient did receive midodrine as well.  4.  Urinary retention.  Patient required not catheterization during hospitalization.  5.  Incidental right lower lobe and right upper lobe pulmonary nodules.  This was discussed with her daughter, plan to follow-up CT in 3 months.  6.  Peripheral neuropathy.  gabapentin decreased to 800 mg to prevent toxicity.  7. Dyslipidemia. Continue with statin therapy.   8. Depression, anxiety, insomnia. Polypharmacy.  Noted to have polypharmacy, dose of gabapentin has been decreased, continue outpatient follow-up.  Discharge Diagnoses:  Principal Problem:   Right femoral fracture (HCC) Active Problems:   Depression   Insomnia   GERD (gastroesophageal reflux disease)   Hyperlipidemia   Polypharmacy   Pulmonary nodules/lesions, multiple   Torus palatinus  Urticaria    Discharge Instructions   Allergies as of 12/24/2021       Reactions   Avelox [moxifloxacin Hcl] Shortness Of Breath, Swelling   Elemental  Sulfur Other (See Comments)   Reaction as a child and not sure what.         Medication List     STOP taking these medications    gabapentin 600 MG tablet Commonly known as: NEURONTIN Replaced by: gabapentin 400 MG capsule       TAKE these medications    amitriptyline 50 MG tablet Commonly known as: ELAVIL Take 50 mg by mouth at bedtime.   amoxicillin-clavulanate 875-125 MG tablet Commonly known as: AUGMENTIN Take 1 tablet by mouth every 12 (twelve) hours for 3 days.   aspirin 81 MG chewable tablet Chew 81 mg by mouth daily.   celecoxib 200 MG capsule Commonly known as: CELEBREX Take 200 mg by mouth 2 (two) times daily.   cholecalciferol 25 MCG (1000 UNIT) tablet Commonly known as: VITAMIN D3 Take 1,000 Units by mouth daily.   enoxaparin 40 MG/0.4ML injection Commonly known as: LOVENOX Inject 0.4 mLs (40 mg total) into the skin daily for 14 days. Start taking on: December 25, 2021   feeding supplement Liqd Take 237 mLs by mouth 2 (two) times daily between meals.   gabapentin 400 MG capsule Commonly known as: NEURONTIN Take 2 capsules (800 mg total) by mouth 3 (three) times daily. Replaces: gabapentin 600 MG tablet   HYDROcodone-acetaminophen 10-325 MG tablet Commonly known as: NORCO Take 1 tablet by mouth 4 (four) times daily as needed for moderate pain or severe pain.   latanoprost 0.005 % ophthalmic solution Commonly known as: XALATAN Place 1 drop into both eyes at bedtime.   multivitamin with minerals Tabs tablet Take 1 tablet by mouth daily. Start taking on: December 25, 2021   pantoprazole 40 MG tablet Commonly known as: PROTONIX Take 40 mg by mouth 2 (two) times daily.   sertraline 25 MG tablet Commonly known as: ZOLOFT Take 25 mg by mouth daily.   simvastatin 40 MG tablet Commonly known as: ZOCOR Take 40 mg by mouth every evening.   zolpidem 10 MG tablet Commonly known as: AMBIEN Take 10 mg by mouth at bedtime.                Durable Medical Equipment  (From admission, onward)           Start     Ordered   12/23/21 1631  For home use only DME Walker rolling  Once       Question Answer Comment  Walker: With 5 Inch Wheels   Patient needs a walker to treat with the following condition Weakness      12/23/21 1630   12/23/21 1630  For home use only DME oxygen  Once       Question Answer Comment  Length of Need 12 Months   Mode or (Route) Nasal cannula   Liters per Minute 2   Oxygen conserving device Yes   Oxygen delivery system Gas      12/23/21 1630            Contact information for after-discharge care     Destination     [facility] .   Service: Skilled Nursing Contact information: [address] [phone]  Allergies  Allergen Reactions   Avelox [Moxifloxacin Hcl] Shortness Of Breath and Swelling   Elemental Sulfur Other (See Comments)    Reaction as a child and not sure what.     Consultations: Orthopedics    Procedures/Studies: DG Chest 2 View  Result Date: 12/16/2021 CLINICAL DATA:  Pain after fall EXAM: CHEST - 2 VIEW COMPARISON:  Chest radiograph 05/27/2021 FINDINGS: The cardiomediastinal silhouette is stable. The lungs are hyperlucent suggesting underlying COPD. There is no focal consolidation or pulmonary edema. There is no pleural effusion or pneumothorax. There is no displaced fracture. IMPRESSION: No radiographic evidence of acute traumatic injury in the chest. Electronically Signed   By: Valetta Mole M.D.   On: 12/16/2021 10:19   CT Head Wo Contrast  Result Date: 12/16/2021 CLINICAL DATA:  Fall EXAM: CT HEAD WITHOUT CONTRAST TECHNIQUE: Contiguous axial images were obtained from the base of the skull through the vertex without intravenous contrast. COMPARISON:  CT head 07/14/2011 FINDINGS: Brain: There is no evidence of acute intracranial hemorrhage, extra-axial fluid  collection, or acute infarct. Parenchymal volume is normal. The ventricles are normal in size. There is no mass lesion. There is no midline shift. Vascular: There is calcification of the bilateral cavernous ICAs. Skull: Normal. Negative for fracture or focal lesion. Sinuses/Orbits: There is mucosal thickening with layering fluid in the left sphenoid sinus. Imaged globes and orbits are unremarkable. Other: None. IMPRESSION: 1. No acute intracranial pathology. 2. Layering fluid in the left sphenoid sinus which can be seen with acute sinusitis in the correct clinical setting. Electronically Signed   By: Valetta Mole M.D.   On: 12/16/2021 10:32   CT Angio Chest Pulmonary Embolism (PE) W or WO Contrast  Result Date: 12/20/2021 CLINICAL DATA:  Positive D-dimer level. EXAM: CT ANGIOGRAPHY CHEST WITH CONTRAST TECHNIQUE: Multidetector CT imaging of the chest was performed using the standard protocol during bolus administration of intravenous contrast. Multiplanar CT image reconstructions and MIPs were obtained to evaluate the vascular anatomy. CONTRAST:  59mL OMNIPAQUE IOHEXOL 350 MG/ML SOLN COMPARISON:  December 16, 2021. FINDINGS: Cardiovascular: Satisfactory opacification of the pulmonary arteries to the segmental level. No evidence of pulmonary embolism. Normal heart size. No pericardial effusion. Atherosclerosis of thoracic aorta is noted without aneurysm or dissection. Mediastinum/Nodes: Esophagus is unremarkable. Thyroid gland appears normal. Right hilar adenopathy is noted with largest lymph node measuring 15 mm. 11 mm precarinal lymph node is noted. Lungs/Pleura: No pneumothorax is noted. New right lower lobe airspace opacity is noted concerning for pneumonia. Mild left basilar subsegmental atelectasis is noted. 12 x 8 mm irregular density is noted posteriorly in the right upper lobe best seen on image number 32 of series 6 which is not changed compared to prior exam. Upper Abdomen: No acute abnormality.  Musculoskeletal: No chest wall abnormality. No acute or significant osseous findings. Review of the MIP images confirms the above findings. IMPRESSION: No definite evidence of pulmonary embolus. New right posterior basilar airspace opacity is noted concerning for aspiration pneumonia. Mild left basilar subsegmental atelectasis is noted. Stable 12 x 8 mm irregular density with pleural tail is noted posteriorly in right upper lobe. Consider one of the following in 3 months for both low-risk and high-risk individuals: (a) repeat chest CT, (b) follow-up PET-CT, or (c) tissue sampling. This recommendation follows the consensus statement: Guidelines for Management of Incidental Pulmonary Nodules Detected on CT Images: From the Fleischner Society 2017; Radiology 2017; 284:228-243. Enlarged right hilar and precarinal adenopathy is noted which may be inflammatory in  etiology, but attention to this abnormality on follow-up imaging is recommended to rule out metastatic disease or malignancy. Aortic Atherosclerosis (ICD10-I70.0). Electronically Signed   By: Marijo Conception M.D.   On: 12/20/2021 18:08   CT Angio Chest PE W and/or Wo Contrast  Result Date: 12/16/2021 CLINICAL DATA:  [place] last night. Patient does not remember why. Decreased oxygen saturation. Cough for 2-4 weeks. EXAM: CT ANGIOGRAPHY CHEST WITH CONTRAST TECHNIQUE: Multidetector CT imaging of the chest was performed using the standard protocol during bolus administration of intravenous contrast. Multiplanar CT image reconstructions and MIPs were obtained to evaluate the vascular anatomy. CONTRAST:  102mL OMNIPAQUE IOHEXOL 350 MG/ML SOLN COMPARISON:  Current chest radiograph.  Prior chest CT, 07/12/2011. FINDINGS: Cardiovascular: Pulmonary arteries are well opacified. There is no evidence of a pulmonary embolism. Heart is normal in size. No pericardial effusion. Minor left coronary artery calcifications. Great vessels are normal in caliber. Aortic  atherosclerosis. No dissection or significant stenosis. Branch vessels are widely patent. Mediastinum/Nodes: No neck base, mediastinal or hilar masses. Prominent inferior right hilar lymph node measuring 1.1 cm short axis. No other adenopathy. Trachea and esophagus are unremarkable. Lungs/Pleura: Irregular nodule, posterior right upper lobe, 11 x 9 mm transversely, image 28, series 6, mean 1 cm. 5 mm nodule, abuts the posterior pleura, right lower lobe, image 59, series 6. There are heterogeneous areas interstitial prominence and linear/reticular type opacities consistent with scarring. Bronchial wall thickening is noted in the lower lungs, most evident in the right lower lobe. Peripheral opacities noted at the right lung base consistent with atelectasis. No convincing pneumonia. No evidence of pulmonary edema. No pleural effusion or pneumothorax. Upper Abdomen: No acute abnormality. Musculoskeletal: No fracture. No bone lesion. Review of the MIP images confirms the above findings. IMPRESSION: 1. No evidence of a pulmonary embolism. 2. Bronchial wall thickening, most evident in the right lower lobe, consistent with acute and/or chronic bronchitis. No evidence of pneumonia or pulmonary edema. 3. 1 cm irregular nodule in the right upper lobe, with 5 mm nodule noted in the posterior right lower lobe. Non-contrast chest CT at 3-6 months is recommended. If the nodules are stable at time of repeat CT, then future CT at 18-24 months (from today's scan) is considered optional for low-risk patients, but is recommended for high-risk patients. This recommendation follows the consensus statement: Guidelines for Management of Incidental Pulmonary Nodules Detected on CT Images: From the Fleischner Society 2017; Radiology 2017; 284:228-243. Aortic Atherosclerosis (ICD10-I70.0). Electronically Signed   By: Lajean Manes M.D.   On: 12/16/2021 18:02   CT Cervical Spine Wo Contrast  Result Date: 12/16/2021 CLINICAL DATA:  Fall,  trauma EXAM: CT CERVICAL SPINE WITHOUT CONTRAST TECHNIQUE: Multidetector CT imaging of the cervical spine was performed without intravenous contrast. Multiplanar CT image reconstructions were also generated. COMPARISON:  None. FINDINGS: Alignment: There is stepwise grade 1 anterolisthesis of C2 on C3 through C4 on C5, and trace retrolisthesis of C5 on C6 and C6 on C7, all likely degenerative in nature. There is no jumped or perched facets or other evidence of traumatic malalignment. Skull base and vertebrae: Skull base alignment is maintained. Vertebral body heights are preserved. There is no evidence of acute fracture. Soft tissues and spinal canal: No prevertebral fluid or swelling. No visible canal hematoma. Disc levels: There is multilevel intervertebral disc space narrowing with associated degenerative endplate change and facet arthropathy. Facet arthropathy is most advanced on the left at C3-C4, while the disc space narrowing and degenerative endplate changes  most advanced at C5-C6 and C6-C7. A posterior disc osteophyte complex at C5-C6 results in at least mild spinal canal stenosis. There is multilevel neural foraminal stenosis, most advanced on the left at C3-C4 and bilaterally at C5-C6 and C6-C7. Upper chest: Partially imaged lung apices are clear. Other: None. IMPRESSION: 1. No acute fracture or traumatic malalignment of the cervical spine. 2. Multilevel spondylolisthesis as detailed above, likely degenerative in nature. 3. Additional multilevel degenerative changes throughout the cervical spine as detailed above, most advanced at C3-C4 and C5-C6. Electronically Signed   By: Valetta Mole M.D.   On: 12/16/2021 10:37   US Venous Img Lower Bilateral (DVT)  Result Date: 12/18/2021 CLINICAL DATA:  Positive D-dimer EXAM: BILATERAL LOWER EXTREMITY VENOUS DOPPLER ULTRASOUND TECHNIQUE: Gray-scale sonography with graded compression, as well as color Doppler and duplex ultrasound were performed to evaluate the  lower extremity deep venous systems from the level of the common femoral vein and including the common femoral, femoral, profunda femoral, popliteal and calf veins including the posterior tibial, peroneal and gastrocnemius veins when visible. The superficial great saphenous vein was also interrogated. Spectral Doppler was utilized to evaluate flow at rest and with distal augmentation maneuvers in the common femoral, femoral and popliteal veins. COMPARISON:  None. FINDINGS: RIGHT LOWER EXTREMITY Common Femoral Vein: No evidence of thrombus. Normal compressibility, respiratory phasicity and response to augmentation. Saphenofemoral Junction: No evidence of thrombus. Normal compressibility and flow on color Doppler imaging. Profunda Femoral Vein: No evidence of thrombus. Normal compressibility and flow on color Doppler imaging. Femoral Vein: No evidence of thrombus. Normal compressibility, respiratory phasicity and response to augmentation. Popliteal Vein: No evidence of thrombus. Normal compressibility, respiratory phasicity and response to augmentation. Calf Veins: No evidence of thrombus. Normal compressibility and flow on color Doppler imaging. LEFT LOWER EXTREMITY Common Femoral Vein: No evidence of thrombus. Normal compressibility, respiratory phasicity and response to augmentation. Saphenofemoral Junction: No evidence of thrombus. Normal compressibility and flow on color Doppler imaging. Profunda Femoral Vein: No evidence of thrombus. Normal compressibility and flow on color Doppler imaging. Femoral Vein: No evidence of thrombus. Normal compressibility, respiratory phasicity and response to augmentation. Popliteal Vein: No evidence of thrombus. Normal compressibility, respiratory phasicity and response to augmentation. Calf Veins: No evidence of thrombus. Normal compressibility and flow on color Doppler imaging. Other Findings:  None. IMPRESSION: No evidence of deep venous thrombosis in either lower extremity.  Electronically Signed   By: Albin Felling M.D.   On: 12/18/2021 11:48   DG Chest Port 1 View  Result Date: 12/20/2021 CLINICAL DATA:  Hypoxia.  COPD.  ORIF right femur. EXAM: PORTABLE CHEST 1 VIEW COMPARISON:  12/16/2021 FINDINGS: Interval development of mild right lower lobe airspace disease and small right effusion. Left lung remains clear. Heart size and vascularity normal.  Negative for heart failure IMPRESSION: Interval development of right lower lobe airspace disease and small right effusion. Probable pneumonia. Electronically Signed   By: Franchot Gallo M.D.   On: 12/20/2021 11:59   DG C-Arm 1-60 Min-No Report  Result Date: 12/17/2021 Fluoroscopy was utilized by the requesting physician.  No radiographic interpretation.   DG HIP UNILAT WITH PELVIS 2-3 VIEWS RIGHT  Result Date: 12/17/2021 CLINICAL DATA:  Operative imaging, right proximal femur fracture ORIF. EXAM: DG HIP (WITH OR WITHOUT PELVIS) 2-3V RIGHT COMPARISON:  12/16/2021. FINDINGS: Four submitted images show placement of a intramedullary rod supporting 2 screws fixing the right intertrochanteric fracture into anatomic alignment. Orthopedic hardware is well-seated IMPRESSION: Well-positioned right proximal femur fracture  following ORIF Electronically Signed   By: Lajean Manes M.D.   On: 12/17/2021 17:19   DG Femur Min 2 Views Right  Result Date: 12/16/2021 CLINICAL DATA:  Pain after fall EXAM: RIGHT FEMUR 2 VIEWS COMPARISON:  None. FINDINGS: Displaced intertrochanteric fracture of the proximal right femur. Soft tissues are unremarkable. IMPRESSION: Displaced intertrochanteric fracture of the proximal right femur. Electronically Signed   By: Yetta Glassman M.D.   On: 12/16/2021 10:20     Procedures: right femur orif   Subjective: Patient is feeling better, no nausea or vomiting, no chest pain or dyspnea. Her strength has improved.   Discharge Exam: Vitals:   12/24/21 0346 12/24/21 0742  BP: (!) 160/72 (!) 157/76  Pulse:  92 87  Resp: 14 17  Temp: 98.2 F (36.8 C) 98 F (36.7 C)  SpO2: 91% 92%   Vitals:   12/23/21 2056 12/24/21 0013 12/24/21 0346 12/24/21 0742  BP: (!) 147/65 (!) 163/75 (!) 160/72 (!) 157/76  Pulse: 88 97 92 87  Resp: 15 18 14 17   Temp: 98.3 F (36.8 C) 97.9 F (36.6 C) 98.2 F (36.8 C) 98 F (36.7 C)  TempSrc:      SpO2: 96% 92% 91% 92%  Weight:      Height:        General: Not in pain or dyspnea,  Neurology: Awake and alert, non focal  E ENT: no pallor, no icterus, oral mucosa moist Cardiovascular: No JVD. S1-S2 present, rhythmic, no gallops, rubs, or murmurs. No lower extremity edema. Pulmonary: positive breath sounds bilaterally, with no wheezing, or rhonchi positive right base rales. Gastrointestinal. Abdomen soft and non tender Skin. No rashes Musculoskeletal: no joint deformities   The results of significant diagnostics from this hospitalization (including imaging, microbiology, ancillary and laboratory) are listed below for reference.     Microbiology: Recent Results (from the past 240 hour(s))  Resp Panel by RT-PCR (Flu A&B, Covid) Nasopharyngeal Swab     Status: None   Collection Time: 12/16/21  2:14 PM   Specimen: Nasopharyngeal Swab; Nasopharyngeal(NP) swabs in vial transport medium  Result Value Ref Range Status   SARS Coronavirus 2 by RT PCR NEGATIVE NEGATIVE Final    Comment: (NOTE) SARS-CoV-2 target nucleic acids are NOT DETECTED.  The SARS-CoV-2 RNA is generally detectable in upper respiratory specimens during the acute phase of infection. The lowest concentration of SARS-CoV-2 viral copies this assay can detect is 138 copies/mL. A negative result does not preclude SARS-Cov-2 infection and should not be used as the sole basis for treatment or other patient management decisions. A negative result may occur with  improper specimen collection/handling, submission of specimen other than nasopharyngeal swab, presence of viral mutation(s) within  the areas targeted by this assay, and inadequate number of viral copies(<138 copies/mL). A negative result must be combined with clinical observations, patient history, and epidemiological information. The expected result is Negative.  Fact Sheet for Patients:  EntrepreneurPulse.com.au  Fact Sheet for Healthcare Providers:  IncredibleEmployment.be  This test is no t yet approved or cleared by the [place] FDA and  has been authorized for detection and/or diagnosis of SARS-CoV-2 by FDA under an Emergency Use Authorization (EUA). This EUA will remain  in effect (meaning this test can be used) for the duration of the COVID-19 declaration under Section 564(b)(1) of the Act, 21 U.S.C.section 360bbb-3(b)(1), unless the authorization is terminated  or revoked sooner.       Influenza A by PCR NEGATIVE NEGATIVE Final  Influenza B by PCR NEGATIVE NEGATIVE Final    Comment: (NOTE) The Xpert Xpress SARS-CoV-2/FLU/RSV plus assay is intended as an aid in the diagnosis of influenza from Nasopharyngeal swab specimens and should not be used as a sole basis for treatment. Nasal washings and aspirates are unacceptable for Xpert Xpress SARS-CoV-2/FLU/RSV testing.  Fact Sheet for Patients: EntrepreneurPulse.com.au  Fact Sheet for Healthcare Providers: IncredibleEmployment.be  This test is not yet approved or cleared by the [place] FDA and has been authorized for detection and/or diagnosis of SARS-CoV-2 by FDA under an Emergency Use Authorization (EUA). This EUA will remain in effect (meaning this test can be used) for the duration of the COVID-19 declaration under Section 564(b)(1) of the Act, 21 U.S.C. section 360bbb-3(b)(1), unless the authorization is terminated or revoked.  Performed at [facility], [address] [place], [place] [address]   MRSA Next Gen by PCR, Nasal     Status: None    Collection Time: 12/17/21  2:30 AM   Specimen: Nasal Mucosa; Nasal Swab  Result Value Ref Range Status   MRSA by PCR Next Gen NOT DETECTED NOT DETECTED Final    Comment: (NOTE) The GeneXpert MRSA Assay (FDA approved for NASAL specimens only), is one component of a comprehensive MRSA colonization surveillance program. It is not intended to diagnose MRSA infection nor to guide or monitor treatment for MRSA infections. Test performance is not FDA approved in patients less than 30 years old. Performed at [facility], [address] [place], [place] [address]   Resp Panel by RT-PCR (Flu A&B, Covid) Nasopharyngeal Swab     Status: None   Collection Time: 12/21/21  1:00 PM   Specimen: Nasopharyngeal Swab; Nasopharyngeal(NP) swabs in vial transport medium  Result Value Ref Range Status   SARS Coronavirus 2 by RT PCR NEGATIVE NEGATIVE Final    Comment: (NOTE) SARS-CoV-2 target nucleic acids are NOT DETECTED.  The SARS-CoV-2 RNA is generally detectable in upper respiratory specimens during the acute phase of infection. The lowest concentration of SARS-CoV-2 viral copies this assay can detect is 138 copies/mL. A negative result does not preclude SARS-Cov-2 infection and should not be used as the sole basis for treatment or other patient management decisions. A negative result may occur with  improper specimen collection/handling, submission of specimen other than nasopharyngeal swab, presence of viral mutation(s) within the areas targeted by this assay, and inadequate number of viral copies(<138 copies/mL). A negative result must be combined with clinical observations, patient history, and epidemiological information. The expected result is Negative.  Fact Sheet for Patients:  EntrepreneurPulse.com.au  Fact Sheet for Healthcare Providers:  IncredibleEmployment.be  This test is no t yet approved or cleared by the [place] FDA and  has  been authorized for detection and/or diagnosis of SARS-CoV-2 by FDA under an Emergency Use Authorization (EUA). This EUA will remain  in effect (meaning this test can be used) for the duration of the COVID-19 declaration under Section 564(b)(1) of the Act, 21 U.S.C.section 360bbb-3(b)(1), unless the authorization is terminated  or revoked sooner.       Influenza A by PCR NEGATIVE NEGATIVE Final   Influenza B by PCR NEGATIVE NEGATIVE Final    Comment: (NOTE) The Xpert Xpress SARS-CoV-2/FLU/RSV plus assay is intended as an aid in the diagnosis of influenza from Nasopharyngeal swab specimens and should not be used as a sole basis for treatment. Nasal washings and aspirates are unacceptable for Xpert Xpress SARS-CoV-2/FLU/RSV testing.  Fact Sheet for Patients: EntrepreneurPulse.com.au  Fact Sheet for Healthcare Providers: IncredibleEmployment.be  This test is not yet approved or cleared by the [place] FDA and has been authorized for detection and/or diagnosis of SARS-CoV-2 by FDA under an Emergency Use Authorization (EUA). This EUA will remain in effect (meaning this test can be used) for the duration of the COVID-19 declaration under Section 564(b)(1) of the Act, 21 U.S.C. section 360bbb-3(b)(1), unless the authorization is terminated or revoked.  Performed at [facility], [address] [place], [place] [address]      Labs: BNP (last 3 results) No results for input(s): BNP in the last 8760 hours. Basic Metabolic Panel: Recent Labs  Lab 12/18/21 0638 12/19/21 0509 12/20/21 0521 12/21/21 0459 12/22/21 0456  NA 133* 137 140 138 144  K 3.5 3.8 3.7 3.8 3.8  CL 97* 97* 99 96* 103  CO2 29 31 33* 33* 33*  GLUCOSE 130* 117* 119* 103* 98  BUN 18 23 24* 25* 19  CREATININE 0.68 0.79 0.75 0.78 0.60  CALCIUM 8.5* 8.8* 8.6* 8.3* 8.3*   Liver Function Tests: No results for input(s): AST, ALT, ALKPHOS, BILITOT, PROT, ALBUMIN  in the last 168 hours. No results for input(s): LIPASE, AMYLASE in the last 168 hours. No results for input(s): AMMONIA in the last 168 hours. CBC: Recent Labs  Lab 12/20/21 0521 12/21/21 0459 12/22/21 0456 12/23/21 0703 12/24/21 0641  WBC 17.9* 16.4* 13.2* 11.3* 10.8*  HGB 10.2* 9.7* 8.7* 9.3* 9.0*  HCT 31.7* 29.8* 27.5* 28.9* 27.5*  MCV 94.6 94.6 95.5 93.8 94.2  PLT 296 287 315 364 417*   Cardiac Enzymes: No results for input(s): CKTOTAL, CKMB, CKMBINDEX, TROPONINI in the last 168 hours. BNP: Invalid input(s): POCBNP CBG: No results for input(s): GLUCAP in the last 168 hours. D-Dimer No results for input(s): DDIMER in the last 72 hours. Hgb A1c No results for input(s): HGBA1C in the last 72 hours. Lipid Profile No results for input(s): CHOL, HDL, LDLCALC, TRIG, CHOLHDL, LDLDIRECT in the last 72 hours. Thyroid function studies No results for input(s): TSH, T4TOTAL, T3FREE, THYROIDAB in the last 72 hours.  Invalid input(s): FREET3 Anemia work up No results for input(s): VITAMINB12, FOLATE, FERRITIN, TIBC, IRON, RETICCTPCT in the last 72 hours. Urinalysis    Component Value Date/Time   COLORURINE YELLOW (A) 12/16/2021 1414   APPEARANCEUR HAZY (A) 12/16/2021 1414   APPEARANCEUR Clear 01/20/2012 1203   LABSPEC 1.013 12/16/2021 1414   LABSPEC 1.004 01/20/2012 1203   PHURINE 7.0 12/16/2021 1414   GLUCOSEU NEGATIVE 12/16/2021 1414   GLUCOSEU Negative 01/20/2012 1203   HGBUR NEGATIVE 12/16/2021 1414   BILIRUBINUR NEGATIVE 12/16/2021 1414   BILIRUBINUR Negative 01/20/2012 1203   KETONESUR NEGATIVE 12/16/2021 1414   PROTEINUR NEGATIVE 12/16/2021 1414   NITRITE POSITIVE (A) 12/16/2021 1414   LEUKOCYTESUR NEGATIVE 12/16/2021 1414   LEUKOCYTESUR Negative 01/20/2012 1203   Sepsis Labs Invalid input(s): PROCALCITONIN,  WBC,  LACTICIDVEN Microbiology Recent Results (from the past 240 hour(s))  Resp Panel by RT-PCR (Flu A&B, Covid) Nasopharyngeal Swab     Status: None    Collection Time: 12/16/21  2:14 PM   Specimen: Nasopharyngeal Swab; Nasopharyngeal(NP) swabs in vial transport medium  Result Value Ref Range Status   SARS Coronavirus 2 by RT PCR NEGATIVE NEGATIVE Final    Comment: (NOTE) SARS-CoV-2 target nucleic acids are NOT DETECTED.  The SARS-CoV-2 RNA is generally detectable in upper respiratory specimens during the acute phase of infection. The lowest concentration of SARS-CoV-2 viral copies this assay can detect  is 138 copies/mL. A negative result does not preclude SARS-Cov-2 infection and should not be used as the sole basis for treatment or other patient management decisions. A negative result may occur with  improper specimen collection/handling, submission of specimen other than nasopharyngeal swab, presence of viral mutation(s) within the areas targeted by this assay, and inadequate number of viral copies(<138 copies/mL). A negative result must be combined with clinical observations, patient history, and epidemiological information. The expected result is Negative.  Fact Sheet for Patients:  EntrepreneurPulse.com.au  Fact Sheet for Healthcare Providers:  IncredibleEmployment.be  This test is no t yet approved or cleared by the [place] FDA and  has been authorized for detection and/or diagnosis of SARS-CoV-2 by FDA under an Emergency Use Authorization (EUA). This EUA will remain  in effect (meaning this test can be used) for the duration of the COVID-19 declaration under Section 564(b)(1) of the Act, 21 U.S.C.section 360bbb-3(b)(1), unless the authorization is terminated  or revoked sooner.       Influenza A by PCR NEGATIVE NEGATIVE Final   Influenza B by PCR NEGATIVE NEGATIVE Final    Comment: (NOTE) The Xpert Xpress SARS-CoV-2/FLU/RSV plus assay is intended as an aid in the diagnosis of influenza from Nasopharyngeal swab specimens and should not be used as a sole basis for treatment.  Nasal washings and aspirates are unacceptable for Xpert Xpress SARS-CoV-2/FLU/RSV testing.  Fact Sheet for Patients: EntrepreneurPulse.com.au  Fact Sheet for Healthcare Providers: IncredibleEmployment.be  This test is not yet approved or cleared by the [place] FDA and has been authorized for detection and/or diagnosis of SARS-CoV-2 by FDA under an Emergency Use Authorization (EUA). This EUA will remain in effect (meaning this test can be used) for the duration of the COVID-19 declaration under Section 564(b)(1) of the Act, 21 U.S.C. section 360bbb-3(b)(1), unless the authorization is terminated or revoked.  Performed at [facility], [address] [place], [place] [address]   MRSA Next Gen by PCR, Nasal     Status: None   Collection Time: 12/17/21  2:30 AM   Specimen: Nasal Mucosa; Nasal Swab  Result Value Ref Range Status   MRSA by PCR Next Gen NOT DETECTED NOT DETECTED Final    Comment: (NOTE) The GeneXpert MRSA Assay (FDA approved for NASAL specimens only), is one component of a comprehensive MRSA colonization surveillance program. It is not intended to diagnose MRSA infection nor to guide or monitor treatment for MRSA infections. Test performance is not FDA approved in patients less than 30 years old. Performed at [facility], [address] [place], [place] [address]   Resp Panel by RT-PCR (Flu A&B, Covid) Nasopharyngeal Swab     Status: None   Collection Time: 12/21/21  1:00 PM   Specimen: Nasopharyngeal Swab; Nasopharyngeal(NP) swabs in vial transport medium  Result Value Ref Range Status   SARS Coronavirus 2 by RT PCR NEGATIVE NEGATIVE Final    Comment: (NOTE) SARS-CoV-2 target nucleic acids are NOT DETECTED.  The SARS-CoV-2 RNA is generally detectable in upper respiratory specimens during the acute phase of infection. The lowest concentration of SARS-CoV-2 viral copies this assay can detect is 138  copies/mL. A negative result does not preclude SARS-Cov-2 infection and should not be used as the sole basis for treatment or other patient management decisions. A negative result may occur with  improper specimen collection/handling, submission of specimen other than nasopharyngeal swab, presence of viral mutation(s) within the areas targeted by this assay, and inadequate number of viral  copies(<138 copies/mL). A negative result must be combined with clinical observations, patient history, and epidemiological information. The expected result is Negative.  Fact Sheet for Patients:  EntrepreneurPulse.com.au  Fact Sheet for Healthcare Providers:  IncredibleEmployment.be  This test is no t yet approved or cleared by the [place] FDA and  has been authorized for detection and/or diagnosis of SARS-CoV-2 by FDA under an Emergency Use Authorization (EUA). This EUA will remain  in effect (meaning this test can be used) for the duration of the COVID-19 declaration under Section 564(b)(1) of the Act, 21 U.S.C.section 360bbb-3(b)(1), unless the authorization is terminated  or revoked sooner.       Influenza A by PCR NEGATIVE NEGATIVE Final   Influenza B by PCR NEGATIVE NEGATIVE Final    Comment: (NOTE) The Xpert Xpress SARS-CoV-2/FLU/RSV plus assay is intended as an aid in the diagnosis of influenza from Nasopharyngeal swab specimens and should not be used as a sole basis for treatment. Nasal washings and aspirates are unacceptable for Xpert Xpress SARS-CoV-2/FLU/RSV testing.  Fact Sheet for Patients: EntrepreneurPulse.com.au  Fact Sheet for Healthcare Providers: IncredibleEmployment.be  This test is not yet approved or cleared by the [place] FDA and has been authorized for detection and/or diagnosis of SARS-CoV-2 by FDA under an Emergency Use Authorization (EUA). This EUA will remain in effect (meaning  this test can be used) for the duration of the COVID-19 declaration under Section 564(b)(1) of the Act, 21 U.S.C. section 360bbb-3(b)(1), unless the authorization is terminated or revoked.  Performed at [facility], [address], [place],  [address]      Time coordinating discharge: 45 minutes  SIGNED:   Tawni Millers, MD  [facility] 12/24/2021, 10:58 AM

## 2021-12-24 NOTE — Progress Notes (Signed)
Discharge Note: Reviewed discharge instructions. PT verbalized understanding. Pt discharged with portable oxygen tank, a rolling walker, DME 3 in 1, and all personal belongings. Staff wheeled pt out. Pt transported to home via family vehicle.

## 2022-03-03 ENCOUNTER — Ambulatory Visit
Admission: RE | Admit: 2022-03-03 | Discharge: 2022-03-03 | Disposition: A | Discharge status: Home / Self Care | Payer: PPO | Source: Ambulatory Visit | Attending: Family Medicine | Admitting: Family Medicine

## 2022-03-03 ENCOUNTER — Other Ambulatory Visit: Payer: Self-pay | Admitting: Family Medicine

## 2022-03-03 ENCOUNTER — Ambulatory Visit
Admission: RE | Admit: 2022-03-03 | Discharge: 2022-03-03 | Disposition: A | Discharge status: Home / Self Care | Payer: PPO | Attending: Family Medicine | Admitting: Family Medicine

## 2022-03-03 DIAGNOSIS — J189 Pneumonia, unspecified organism: Secondary | ICD-10-CM | POA: Insufficient documentation

## 2022-03-16 ENCOUNTER — Ambulatory Visit
Admission: RE | Admit: 2022-03-16 | Discharge: 2022-03-16 | Disposition: A | Discharge status: Home / Self Care | Payer: PPO | Attending: Family Medicine | Admitting: Family Medicine

## 2022-03-16 ENCOUNTER — Ambulatory Visit
Admission: RE | Admit: 2022-03-16 | Discharge: 2022-03-16 | Disposition: A | Discharge status: Home / Self Care | Payer: PPO | Source: Ambulatory Visit | Attending: Family Medicine | Admitting: Family Medicine

## 2022-03-16 ENCOUNTER — Other Ambulatory Visit: Payer: Self-pay | Admitting: Family Medicine

## 2022-03-16 DIAGNOSIS — Z8701 Personal history of pneumonia (recurrent): Secondary | ICD-10-CM | POA: Insufficient documentation

## 2022-04-06 ENCOUNTER — Ambulatory Visit
Admission: RE | Admit: 2022-04-06 | Discharge: 2022-04-06 | Disposition: A | Discharge status: Home / Self Care | Payer: PPO | Attending: Family Medicine | Admitting: Family Medicine

## 2022-04-06 ENCOUNTER — Other Ambulatory Visit: Payer: Self-pay | Admitting: Family Medicine

## 2022-04-06 ENCOUNTER — Ambulatory Visit
Admission: RE | Admit: 2022-04-06 | Discharge: 2022-04-06 | Disposition: A | Discharge status: Home / Self Care | Payer: PPO | Source: Ambulatory Visit | Attending: Family Medicine | Admitting: Family Medicine

## 2022-04-06 DIAGNOSIS — J189 Pneumonia, unspecified organism: Secondary | ICD-10-CM

## 2022-12-20 ENCOUNTER — Other Ambulatory Visit: Payer: Self-pay | Admitting: *Deleted

## 2022-12-20 DIAGNOSIS — Z122 Encounter for screening for malignant neoplasm of respiratory organs: Secondary | ICD-10-CM

## 2022-12-20 DIAGNOSIS — Z87891 Personal history of nicotine dependence: Secondary | ICD-10-CM

## 2023-01-19 ENCOUNTER — Encounter: Payer: Self-pay | Admitting: Acute Care

## 2023-01-19 NOTE — Progress Notes (Signed)
Virtual Visit via Telephone Note  I connected with Alexandra [name] on 01/19/23 at  1:30 PM EST by telephone and verified that I am speaking with the correct person using two identifiers.  Location: Patient: At home  Provider:  [place], [place], [place], [address]    I discussed the limitations, risks, security and privacy concerns of performing an evaluation and management service by telephone and the availability of in person appointments. I also discussed with the patient that there may be a patient responsible charge related to this service. The patient expressed understanding and agreed to proceed.  Shared Decision Making Visit Lung Cancer Screening Program [phone])   Eligibility: Age 82 y.o. Pack Years Smoking History Calculation 50 pack year (# packs/per year x # years smoked) Recent History of coughing up blood  no Unexplained weight loss? no ( >Than 15 pounds within the last 6 months ) Prior History Lung / other cancer no (Diagnosis within the last 5 years already requiring surveillance chest CT Scans). Smoking Status Former Smoker Former Smokers: Years since quit: 2021  Quit Date: 2021  Visit Components: Discussion included one or more decision making aids. yes Discussion included risk/benefits of screening. yes Discussion included potential follow up diagnostic testing for abnormal scans. yes Discussion included meaning and risk of over diagnosis. yes Discussion included meaning and risk of False Positives. yes Discussion included meaning of total radiation exposure. yes  Counseling Included: Importance of adherence to annual lung cancer LDCT screening. yes Impact of comorbidities on ability to participate in the program. yes Ability and willingness to under diagnostic treatment. yes  Smoking Cessation Counseling: Current Smokers:  Discussed importance of smoking cessation. yes Information about tobacco cessation classes and interventions provided to  patient. yes Patient provided with "ticket" for LDCT Scan. NA Symptomatic Patient. no  CounselingNA Diagnosis Code: Tobacco Use Z72.0 Asymptomatic Patient yes  Counseling (Intermediate counseling: > three minutes counseling) [id number] Former Smokers:  Discussed the importance of maintaining cigarette abstinence. yes Diagnosis Code: Personal History of Nicotine Dependence. [id number] Information about tobacco cessation classes and interventions provided to patient. Yes Patient provided with "ticket" for LDCT Scan. NA Written Order for Lung Cancer Screening with LDCT placed in Epic. Yes (CT Chest Lung Cancer Screening Low Dose W/O CM) [id number] Z12.2-Screening of respiratory organs Z87.891-Personal history of nicotine dependence  I spent 25 minutes of face to face time/virtual visit time  with Ms. Sotak discussing the risks and benefits of lung cancer screening. We took the time to pause the power point at intervals to allow for questions to be asked and answered to ensure understanding. We discussed that she had taken the single most powerful action possible to decrease her risk of developing lung cancer when she quit smoking. I counseled her to remain smoke free, and to contact me if she ever had the desire to smoke again so that I can provide resources and tools to help support the effort to remain smoke free. We discussed the time and location of the scan, and that either  Doroteo Glassman RN, Joella Prince, RN or I  or I will call / send a letter with the results within  24-72 hours of receiving them. She has the office contact information in the event she  needs to speak with me,  she verbalized understanding of all of the above and had no further questions upon leaving the office.     I explained to the patient that there has been a high incidence  of coronary artery disease noted on these exams. I explained that this is a non-gated exam therefore degree or severity cannot be determined. This patient is  on statin therapy. I have asked the patient to follow-up with their PCP regarding any incidental finding of coronary artery disease and management with diet or medication as they feel is clinically indicated. The patient verbalized understanding of the above and had no further questions.      Magdalen Spatz, NP 01/19/2023

## 2023-01-30 ENCOUNTER — Ambulatory Visit (INDEPENDENT_AMBULATORY_CARE_PROVIDER_SITE_OTHER): Payer: PPO | Admitting: Acute Care

## 2023-01-30 ENCOUNTER — Encounter: Payer: Self-pay | Admitting: Acute Care

## 2023-01-30 DIAGNOSIS — Z87891 Personal history of nicotine dependence: Secondary | ICD-10-CM

## 2023-01-30 NOTE — Patient Instructions (Signed)
Thank you for participating in the [place] Lung Cancer Screening Program. It was our pleasure to meet you today. We will call you with the results of your scan within the next few days. Your scan will be assigned a Lung RADS category score by the physicians reading the scans.  This Lung RADS score determines follow up scanning.  See below for description of categories, and follow up screening recommendations. We will be in touch to schedule your follow up screening annually or based on recommendations of our providers. We will fax a copy of your scan results to your Primary Care Physician, or the physician who referred you to the program, to ensure they have the results. Please call the office if you have any questions or concerns regarding your scanning experience or results.  Our office number is [phone]. Please speak with Denise Phelps, RN. , or  Denise Buckner RN, They are  our Lung Cancer Screening RN.'s If They are unavailable when you call, Please leave a message on the voice mail. We will return your call at our earliest convenience.This voice mail is monitored several times a day.  Remember, if your scan is normal, we will scan you annually as long as you continue to meet the criteria for the program. (Age 50-80, Current smoker or smoker who has quit within the last 15 years). If you are a smoker, remember, quitting is the single most powerful action that you can take to decrease your risk of lung cancer and other pulmonary, breathing related problems. We know quitting is hard, and we are here to help.  Please let us know if there is anything we can do to help you meet your goal of quitting. If you are a former smoker, congratulations. We are proud of you! Remain smoke free! Remember you can refer friends or family members through the number above.  We will screen them to make sure they meet criteria for the program. Thank you for helping us take better care of you by  participating in Lung Screening.  You can receive free nicotine replacement therapy ( patches, gum or mints) by calling 1-800-QUIT NOW. Please call so we can get you on the path to becoming  a non-smoker. I know it is hard, but you can do this!  Lung RADS Categories:  Lung RADS 1: no nodules or definitely non-concerning nodules.  Recommendation is for a repeat annual scan in 12 months.  Lung RADS 2:  nodules that are non-concerning in appearance and behavior with a very low likelihood of becoming an active cancer. Recommendation is for a repeat annual scan in 12 months.  Lung RADS 3: nodules that are probably non-concerning , includes nodules with a low likelihood of becoming an active cancer.  Recommendation is for a 6-month repeat screening scan. Often noted after an upper respiratory illness. We will be in touch to make sure you have no questions, and to schedule your 6-month scan.  Lung RADS 4 A: nodules with concerning findings, recommendation is most often for a follow up scan in 3 months or additional testing based on our provider's assessment of the scan. We will be in touch to make sure you have no questions and to schedule the recommended 3 month follow up scan.  Lung RADS 4 B:  indicates findings that are concerning. We will be in touch with you to schedule additional diagnostic testing based on our provider's  assessment of the scan.  Other options for assistance in smoking cessation (   As covered by your insurance benefits)  Hypnosis for smoking cessation  [employer] [phone]  Acupuncture for smoking cessation  [facility] [phone]   

## 2023-01-31 ENCOUNTER — Telehealth: Payer: Self-pay | Admitting: Acute Care

## 2023-01-31 ENCOUNTER — Ambulatory Visit
Admission: RE | Admit: 2023-01-31 | Discharge: 2023-01-31 | Disposition: A | Discharge status: Home / Self Care | Payer: PPO | Source: Ambulatory Visit | Attending: Acute Care | Admitting: Acute Care

## 2023-01-31 DIAGNOSIS — R928 Other abnormal and inconclusive findings on diagnostic imaging of breast: Secondary | ICD-10-CM | POA: Insufficient documentation

## 2023-01-31 DIAGNOSIS — R911 Solitary pulmonary nodule: Secondary | ICD-10-CM | POA: Insufficient documentation

## 2023-01-31 DIAGNOSIS — Z87891 Personal history of nicotine dependence: Secondary | ICD-10-CM | POA: Insufficient documentation

## 2023-01-31 DIAGNOSIS — J439 Emphysema, unspecified: Secondary | ICD-10-CM | POA: Insufficient documentation

## 2023-01-31 DIAGNOSIS — I700 Atherosclerosis of aorta: Secondary | ICD-10-CM | POA: Insufficient documentation

## 2023-01-31 DIAGNOSIS — Z122 Encounter for screening for malignant neoplasm of respiratory organs: Secondary | ICD-10-CM | POA: Insufficient documentation

## 2023-01-31 DIAGNOSIS — Z9981 Dependence on supplemental oxygen: Secondary | ICD-10-CM | POA: Insufficient documentation

## 2023-01-31 NOTE — Telephone Encounter (Signed)
Received call report from [facility] with [facility] on patient's lung cancer screen CT done on 01/31/23. Sarah, please review the result/impression copied below:  IMPRESSION: 1. Lung-RADS 4A, suspicious. Follow up low-dose chest CT without contrast in 3 months (please use the following order, "CT CHEST LCS NODULE FOLLOW-UP W/O CM") is recommended. Alternatively, PET may be considered when there is a solid component 81m or larger. 14 mm posterior right upper lobe pulmonary nodule. This nodule has been present on prior non screening chest CTs back to 12/16/2021 but appears minimally larger today. 2. Innumerable tiny bilateral pulmonary nodules with a peripheral predominance and tree-in-bud configuration in many areas. Imaging features are most suggestive of an infectious/inflammatory etiology, including atypical infection (including MAI). A second dominant nodule in the medial right lower lobe measuring 12.1 mm is new since prior studies and probably infectious/inflammatory 3. Aortic Atherosclerosis (ICD10-I70.0) and Emphysema (ICD10-J43.9).  Please advise, thank you.   **also routing to lung nodule pool**

## 2023-02-01 ENCOUNTER — Telehealth: Payer: Self-pay | Admitting: Acute Care

## 2023-02-01 DIAGNOSIS — R911 Solitary pulmonary nodule: Secondary | ICD-10-CM

## 2023-02-01 NOTE — Telephone Encounter (Signed)
CT results faxed to PCP and Dr Raul Del with follow up plans for PET scan included.

## 2023-02-01 NOTE — Telephone Encounter (Signed)
I have called the patient with the results of her low dose Ct Chest. I explained that her scan was read as a Lung RADS 4 A : suspicious findings, either short term follow up in 3 months or alternatively  PET Scan evaluation may be considered when there is a solid component of  8 mm or larger. She has a 14 mm nodule in the right lung apex that appears to have grown since her previous scan in 11/2021. I have reviewed the scan with both Dr.Icard and Dr. Patsey Berthold and they both agree that patient needs PET scan now rather than repeat CT Chest in 3 month. Please fax results to PCP and let them know plan for follow up. She is a Engineer, maintenance patient, so she will see him for any treatment or procedures if indicated.  Alexandra [name], I have ordered the PET scan. Please schedule with me for a video visit after the scan to review results. Thanks so much

## 2023-02-01 NOTE — Telephone Encounter (Signed)
See telephone note 02/01/23

## 2023-02-01 NOTE — Telephone Encounter (Signed)
Results/plan faxed to PCP 

## 2023-02-13 ENCOUNTER — Other Ambulatory Visit: Payer: PPO

## 2023-02-14 ENCOUNTER — Ambulatory Visit
Admission: RE | Admit: 2023-02-14 | Discharge: 2023-02-14 | Disposition: A | Discharge status: Home / Self Care | Payer: PPO | Source: Ambulatory Visit | Attending: Acute Care | Admitting: Acute Care

## 2023-02-14 DIAGNOSIS — I700 Atherosclerosis of aorta: Secondary | ICD-10-CM | POA: Insufficient documentation

## 2023-02-14 DIAGNOSIS — R918 Other nonspecific abnormal finding of lung field: Secondary | ICD-10-CM | POA: Insufficient documentation

## 2023-02-14 DIAGNOSIS — R911 Solitary pulmonary nodule: Secondary | ICD-10-CM | POA: Diagnosis present

## 2023-02-14 DIAGNOSIS — K769 Liver disease, unspecified: Secondary | ICD-10-CM | POA: Diagnosis not present

## 2023-02-14 LAB — GLUCOSE, CAPILLARY: Glucose-Capillary: 100 mg/dL — ABNORMAL HIGH (ref 70–99)

## 2023-02-14 MED ORDER — FLUDEOXYGLUCOSE F - 18 (FDG) INJECTION
8.3600 | Freq: Once | INTRAVENOUS | Status: AC | PRN
  Administered 2023-02-14: 8.36 via INTRAVENOUS

## 2023-02-16 ENCOUNTER — Ambulatory Visit (INDEPENDENT_AMBULATORY_CARE_PROVIDER_SITE_OTHER): Payer: PPO | Admitting: Acute Care

## 2023-02-16 ENCOUNTER — Encounter: Payer: Self-pay | Admitting: Acute Care

## 2023-02-16 ENCOUNTER — Telehealth: Payer: Self-pay | Admitting: Acute Care

## 2023-02-16 DIAGNOSIS — R9389 Abnormal findings on diagnostic imaging of other specified body structures: Secondary | ICD-10-CM | POA: Diagnosis not present

## 2023-02-16 DIAGNOSIS — R942 Abnormal results of pulmonary function studies: Secondary | ICD-10-CM

## 2023-02-16 DIAGNOSIS — Z87891 Personal history of nicotine dependence: Secondary | ICD-10-CM

## 2023-02-16 NOTE — Telephone Encounter (Signed)
I have called Dr. Joanell Rising  office at [place] . I spoke with April. I explained that Ms. Bunda PET scan was positive for right upper lobe pulmonary nodule is hypermetabolic with SUV max of [id number]. Findings consistent primary lung neoplasm. No enlarged mediastinal hilar lymph nodes. No hypermetabolic breast lesions supraclavicular axillary adenopathy. The 12 mm sub solid nodular lesion in the right lower lobe demonstrates low level FDG uptake with SUV max of 1.62. She is followed by Dr. Vella Kohler, and I am referring her back to him for biopsy and follow up of this finding. Pt. Has an appointment with him 3/28. But April will talk with Dr. Vella Kohler and see if she can get the patient appointment moved up.  Plan is for follow up with Dr. Raul Del, most likely biopsy and then plan of care based on biopsy results.I have provided my contact info in the event Dr. Vella Kohler has any questions or concerns.  The  Subpleural nodular density in the right lower lobe has an SUV max of 1.92 and is indeterminate and needs surveillance also.  Langley Gauss, we need to place on tickle list and follow results . Thanks so much

## 2023-02-16 NOTE — Patient Instructions (Addendum)
It was good to talk with you today. Your PET scan does indicate that the right upper lobe lung  nodule we were concerned about could be a lung cancer. Plan will most likely be for a biopsy by Dr. Vella Kohler to better evaluate this nodule and get a diagnosis I have called Dr. Joanell Rising  Office to see if he can get you in sooner to discuss next steps for care. If you do not hear from them within the week please let us know so we can check with them and make sure you get seen Follow up as needed. Please contact office for sooner follow up if symptoms do not improve or worsen or seek emergency care

## 2023-02-16 NOTE — Progress Notes (Signed)
Virtual Visit via Telephone Note  I connected with Alexandra [name] on 02/16/23 at  9:00 AM EST by telephone and verified that I am speaking with the correct person using two identifiers.  Location: Patient:  At home Provider:  [place], [place], [place], [address]    I discussed the limitations, risks, security and privacy concerns of performing an evaluation and management service by telephone and the availability of in person appointments. I also discussed with the patient that there may be a patient responsible charge related to this service. The patient expressed understanding and agreed to proceed.   History of Present Illness: Alexandra [name] is an 82 year old female former smoker with a 37 pack year  smoking history( quit 2021)followed through the screening program. 01/31/2023 she had an abnormal screening scan, read as a 4 A, 3 month follow up . As the size of the nodule was > 8 mm we opted to PET scan . PET showed a  Right upper lobe pulmonary nodule that  is hypermetabolic and consistent with primary lung neoplasm. She is followed by Dr. Vella Kohler in [place]. We will refer back to him for further work up. I have called his office. I spoke with April ,who was going to message him to arrange for follow up. She most likely will need biopsy and if malignant, SBRT may be an option for treatment.   Observations/Objective: 02/14/2023 PET Scan  The right upper lobe pulmonary nodule is hypermetabolic and consistent with primary lung neoplasm. No mediastinal or hilar adenopathy. Two right lower lobe pulmonary lesions demonstrate low level FDG uptake and are indeterminate but suspicious. Recommend close CT follow-up. No findings for abdominal/pelvic metastatic disease or osseous metastatic disease. Advanced emphysematous changes.  01/31/2023 LDCT dominant spiculated 14 mm nodule in the posterior right lung apex (image 86). A second prominent right lower lobe lesion measuring 12.1 mm is  identified   Lung-RADS 4A, suspicious. Follow up low-dose chest CT without contrast in 3 months (please use the following order, "CT CHEST LCS NODULE FOLLOW-UP W/O CM") is recommended. Alternatively, PET may be considered when there is a solid component 83m or larger. 14 mm posterior right upper lobe pulmonary nodule. This nodule has been present on prior non screening chest CTs back to 12/16/2021 but appears minimally larger today. Assessment and Plan: Right upper lobe pulmonary nodule is hypermetabolic and consistent with primary lung neoplasm in former smoker Pt [facility] was called with results of PET and request for OV with Dr. FVella Kohlerto discuss next steps for care I explained to patient next step may be biopsy for diagnosis  Follow Up Instructions: [facility] [place] and suspected  primary lung neoplasm   I discussed the assessment and treatment plan with the patient. The patient was provided an opportunity to ask questions and all were answered. The patient agreed with the plan and demonstrated an understanding of the instructions.   The patient was advised to call back or seek an in-person evaluation if the symptoms worsen or if the condition fails to improve as anticipated.  I provided 25 minutes of non-face-to-face time during this encounter.   SMagdalen Spatz NP  02/16/2023

## 2023-03-08 ENCOUNTER — Other Ambulatory Visit: Payer: Self-pay | Admitting: Pulmonary Disease

## 2023-03-08 DIAGNOSIS — R911 Solitary pulmonary nodule: Secondary | ICD-10-CM

## 2023-03-13 ENCOUNTER — Ambulatory Visit
Admission: RE | Admit: 2023-03-13 | Discharge: 2023-03-13 | Disposition: A | Discharge status: Home / Self Care | Payer: PPO | Source: Ambulatory Visit | Attending: Pulmonary Disease | Admitting: Pulmonary Disease

## 2023-03-13 DIAGNOSIS — R911 Solitary pulmonary nodule: Secondary | ICD-10-CM | POA: Diagnosis present

## 2023-03-16 ENCOUNTER — Encounter
Admission: RE | Admit: 2023-03-16 | Discharge: 2023-03-16 | Disposition: A | Discharge status: Home / Self Care | Payer: PPO | Source: Ambulatory Visit | Attending: Pulmonary Disease | Admitting: Pulmonary Disease

## 2023-03-16 ENCOUNTER — Other Ambulatory Visit: Payer: PPO

## 2023-03-16 VITALS — BP 141/66 | HR 85 | Resp 16 | Ht 69.0 in | Wt 147.9 lb

## 2023-03-16 DIAGNOSIS — J449 Chronic obstructive pulmonary disease, unspecified: Secondary | ICD-10-CM | POA: Diagnosis not present

## 2023-03-16 DIAGNOSIS — Z01818 Encounter for other preprocedural examination: Secondary | ICD-10-CM | POA: Diagnosis present

## 2023-03-16 DIAGNOSIS — Z01810 Encounter for preprocedural cardiovascular examination: Secondary | ICD-10-CM

## 2023-03-16 DIAGNOSIS — Z01812 Encounter for preprocedural laboratory examination: Secondary | ICD-10-CM

## 2023-03-16 DIAGNOSIS — R0602 Shortness of breath: Secondary | ICD-10-CM

## 2023-03-16 DIAGNOSIS — Z1152 Encounter for screening for COVID-19: Secondary | ICD-10-CM | POA: Diagnosis not present

## 2023-03-16 HISTORY — DX: Gastro-esophageal reflux disease without esophagitis: K21.9

## 2023-03-16 HISTORY — DX: Unspecified fracture of right femur, initial encounter for closed fracture: S72.91XA

## 2023-03-16 HISTORY — DX: Hyperlipidemia, unspecified: E78.5

## 2023-03-16 HISTORY — DX: Other nonspecific abnormal finding of lung field: R91.8

## 2023-03-16 LAB — BASIC METABOLIC PANEL
Anion gap: 11 (ref 5–15)
BUN: 18 mg/dL (ref 8–23)
CO2: 32 mmol/L (ref 22–32)
Calcium: 9.3 mg/dL (ref 8.9–10.3)
Chloride: 95 mmol/L — ABNORMAL LOW (ref 98–111)
Creatinine, Ser: 0.9 mg/dL (ref 0.44–1.00)
GFR, Estimated: >60 mL/min (ref >60)
Glucose, Bld: 88 mg/dL (ref 70–99)
Potassium: 4.3 mmol/L (ref 3.5–5.1)
Sodium: 138 mmol/L (ref 135–145)

## 2023-03-16 LAB — CBC
HCT: 33.2 % — ABNORMAL LOW (ref 36.0–46.0)
Hemoglobin: 10.5 g/dL — ABNORMAL LOW (ref 12.0–15.0)
MCH: 28.9 pg (ref 26.0–34.0)
MCHC: 31.6 g/dL (ref 30.0–36.0)
MCV: 91.5 fL (ref 80.0–100.0)
Platelets: 464 10*3/uL — ABNORMAL HIGH (ref 150–400)
RBC: 3.63 MIL/uL — ABNORMAL LOW (ref 3.87–5.11)
RDW: 13 % (ref 11.5–15.5)
WBC: 10.2 10*3/uL (ref 4.0–10.5)
nRBC: 0 % (ref 0.0–0.2)

## 2023-03-16 LAB — PROTIME-INR
INR: 1.1 (ref 0.8–1.2)
Prothrombin Time: 13.8 seconds (ref 11.4–15.2)

## 2023-03-16 LAB — APTT: aPTT: 32 seconds (ref 24–36)

## 2023-03-16 NOTE — Patient Instructions (Addendum)
Your procedure is scheduled on:03-19-23 Monday Report to the Registration Desk on the 1st floor of the [facility].Then proceed to the 2nd floor Surgery Desk To find out your arrival time, please call [phone] between 1PM - 3PM on:03-16-23 Friday If your arrival time is 6:00 am, do not arrive before that time as the [employer] entrance doors do not open until 6:00 am.  REMEMBER: Instructions that are not followed completely may result in serious medical risk, up to and including death; or upon the discretion of your surgeon and anesthesiologist your surgery may need to be rescheduled.  Do not eat food after midnight the night before surgery.  No gum chewing or hard candies.  You may however, drink CLEAR liquids up to 2 hours before you are scheduled to arrive for your surgery. Do not drink anything within 2 hours of your scheduled arrival time.  Clear liquids include: - water  - apple juice without pulp - gatorade (not RED colors) - black coffee or tea (Do NOT add milk or creamers to the coffee or tea) Do NOT drink anything that is not on this list.  One week prior to surgery: Stop Anti-inflammatories (NSAIDS) such as Advil, Aleve, Ibuprofen, Motrin, Naproxen, Naprosyn and Aspirin based products such as Excedrin, Goody's Powder, BC Powder.You may however, continue to take Tylenol/Hydrocodone if needed for pain up until the day of surgery. You may continue your celecoxib (CELEBREX) up until the day prior to surgery  Stop ANY OVER THE COUNTER supplements/vitamins NOW (03-16-23) until after surgery (Vitamin D)  Last dose of Aspirin 81 mg was on 03-14-23  TAKE ONLY THESE MEDICATIONS THE MORNING OF SURGERY WITH A SIP OF WATER: -gabapentin (NEURONTIN)  -pantoprazole (PROTONIX)  -You may take Hydrocodone if needed  Use your Budeson-Glycopyrrol-Formoterol (BREZTRI AEROSPHERE) and Albuterol Inhaler the day of surgery and bring your Albuterol Inhaler to the hospital  No Alcohol for 24  hours before or after surgery.  No Smoking including e-cigarettes for 24 hours before surgery.  No chewable tobacco products for at least 6 hours before surgery.  No nicotine patches on the day of surgery.  Do not use any "recreational" drugs for at least a week (preferably 2 weeks) before your surgery.  Please be advised that the combination of cocaine and anesthesia may have negative outcomes, up to and including death. If you test positive for cocaine, your surgery will be cancelled.  On the morning of surgery brush your teeth with toothpaste and water, you may rinse your mouth with mouthwash if you wish. Do not swallow any toothpaste or mouthwash.  Do not wear jewelry, make-up, hairpins, clips or nail polish.  Do not wear lotions, powders, or perfumes.   Do not shave body hair from the neck down 48 hours before surgery.  Contact lenses, hearing aids and dentures may not be worn into surgery.  Do not bring valuables to the hospital. [facility] is not responsible for any missing/lost belongings or valuables.    Notify your doctor if there is any change in your medical condition (cold, fever, infection).  Wear comfortable clothing (specific to your surgery type) to the hospital.  After surgery, you can help prevent lung complications by doing breathing exercises.  Take deep breaths and cough every 1-2 hours. Your doctor may order a device called an Incentive Spirometer to help you take deep breaths. When coughing or sneezing, hold a pillow firmly against your incision with both hands. This is called "splinting." Doing this helps protect  your incision. It also decreases belly discomfort.  If you are being admitted to the hospital overnight, leave your suitcase in the car. After surgery it may be brought to your room.  In case of increased patient census, it may be necessary for you, the patient, to continue your postoperative care in the Same Day Surgery department.  If you are  being discharged the day of surgery, you will not be allowed to drive home. You will need a responsible individual to drive you home and stay with you for 24 hours after surgery.   If you are taking public transportation, you will need to have a responsible individual with you.  Please call the [facility] Dept. at [phone] if you have any questions about these instructions.  Surgery Visitation Policy:  Patients having surgery or a procedure may have two visitors.  Children under the age of 63 must have an adult with them who is not the patient.

## 2023-03-17 LAB — SARS CORONAVIRUS 2 (TAT 6-24 HRS): SARS Coronavirus 2: NEGATIVE

## 2023-03-18 MED ORDER — LACTATED RINGERS IV SOLN: INTRAVENOUS | Status: DC

## 2023-03-18 MED ORDER — ORAL CARE MOUTH RINSE: 15.0000 mL | Freq: Once | OROMUCOSAL | Status: DC

## 2023-03-18 MED ORDER — CHLORHEXIDINE GLUCONATE 0.12 % MT SOLN: 15.0000 mL | Freq: Once | OROMUCOSAL | Status: DC

## 2023-03-19 ENCOUNTER — Ambulatory Visit: Payer: PPO

## 2023-03-19 ENCOUNTER — Ambulatory Visit
Admission: RE | Admit: 2023-03-19 | Discharge: 2023-03-19 | Disposition: A | Discharge status: Home / Self Care | Payer: PPO | Attending: Pulmonary Disease | Admitting: Pulmonary Disease

## 2023-03-19 ENCOUNTER — Ambulatory Visit: Payer: PPO | Admitting: Urgent Care

## 2023-03-19 ENCOUNTER — Ambulatory Visit: Payer: PPO | Admitting: Registered Nurse

## 2023-03-19 ENCOUNTER — Other Ambulatory Visit: Payer: Self-pay

## 2023-03-19 ENCOUNTER — Encounter
Admission: RE | Disposition: A | Discharge status: Home / Self Care | Payer: Self-pay | Source: Home / Self Care | Attending: Pulmonary Disease

## 2023-03-19 DIAGNOSIS — G8929 Other chronic pain: Secondary | ICD-10-CM | POA: Diagnosis not present

## 2023-03-19 DIAGNOSIS — Z09 Encounter for follow-up examination after completed treatment for conditions other than malignant neoplasm: Secondary | ICD-10-CM | POA: Insufficient documentation

## 2023-03-19 DIAGNOSIS — Z9981 Dependence on supplemental oxygen: Secondary | ICD-10-CM | POA: Insufficient documentation

## 2023-03-19 DIAGNOSIS — G4700 Insomnia, unspecified: Secondary | ICD-10-CM | POA: Insufficient documentation

## 2023-03-19 DIAGNOSIS — Z79899 Other long term (current) drug therapy: Secondary | ICD-10-CM | POA: Insufficient documentation

## 2023-03-19 DIAGNOSIS — G629 Polyneuropathy, unspecified: Secondary | ICD-10-CM | POA: Diagnosis not present

## 2023-03-19 DIAGNOSIS — K219 Gastro-esophageal reflux disease without esophagitis: Secondary | ICD-10-CM | POA: Insufficient documentation

## 2023-03-19 DIAGNOSIS — J432 Centrilobular emphysema: Secondary | ICD-10-CM | POA: Diagnosis not present

## 2023-03-19 DIAGNOSIS — F419 Anxiety disorder, unspecified: Secondary | ICD-10-CM | POA: Diagnosis not present

## 2023-03-19 DIAGNOSIS — C3411 Malignant neoplasm of upper lobe, right bronchus or lung: Secondary | ICD-10-CM | POA: Diagnosis not present

## 2023-03-19 DIAGNOSIS — J479 Bronchiectasis, uncomplicated: Secondary | ICD-10-CM | POA: Diagnosis not present

## 2023-03-19 DIAGNOSIS — Z87891 Personal history of nicotine dependence: Secondary | ICD-10-CM | POA: Diagnosis not present

## 2023-03-19 HISTORY — PX: VIDEO BRONCHOSCOPY WITH ENDOBRONCHIAL ULTRASOUND: SHX6177

## 2023-03-19 SURGERY — BRONCHOSCOPY, WITH EBUS
Anesthesia: General

## 2023-03-19 MED ORDER — NEOSTIGMINE METHYLSULFATE 10 MG/10ML IV SOLN
INTRAVENOUS | Status: DC | PRN
  Administered 2023-03-19: 4 mg via INTRAVENOUS

## 2023-03-19 MED ORDER — PROPOFOL 500 MG/50ML IV EMUL
INTRAVENOUS | Status: DC | PRN
  Administered 2023-03-19: 150 ug/kg/min via INTRAVENOUS

## 2023-03-19 MED ORDER — GLYCOPYRROLATE 0.2 MG/ML IJ SOLN
INTRAMUSCULAR | Status: AC
  Filled 2023-03-19: qty 3

## 2023-03-19 MED ORDER — ROCURONIUM BROMIDE 10 MG/ML (PF) SYRINGE
PREFILLED_SYRINGE | INTRAVENOUS | Status: AC
  Filled 2023-03-19: qty 10

## 2023-03-19 MED ORDER — PROPOFOL 1000 MG/100ML IV EMUL
INTRAVENOUS | Status: AC
  Filled 2023-03-19: qty 100

## 2023-03-19 MED ORDER — LIDOCAINE HCL URETHRAL/MUCOSAL 2 % EX GEL: 1.0000 | Freq: Once | CUTANEOUS | Status: DC

## 2023-03-19 MED ORDER — LIDOCAINE HCL (PF) 2 % IJ SOLN
INTRAMUSCULAR | Status: AC
  Filled 2023-03-19: qty 5

## 2023-03-19 MED ORDER — IPRATROPIUM-ALBUTEROL 0.5-2.5 (3) MG/3ML IN SOLN
RESPIRATORY_TRACT | Status: AC
  Filled 2023-03-19: qty 3

## 2023-03-19 MED ORDER — IPRATROPIUM-ALBUTEROL 0.5-2.5 (3) MG/3ML IN SOLN
3.0000 mL | Freq: Once | RESPIRATORY_TRACT | Status: AC
  Administered 2023-03-19: 3 mL via RESPIRATORY_TRACT

## 2023-03-19 MED ORDER — BUTAMBEN-TETRACAINE-BENZOCAINE 2-2-14 % EX AERO: 1.0000 | INHALATION_SPRAY | Freq: Once | CUTANEOUS | Status: DC

## 2023-03-19 MED ORDER — GLYCOPYRROLATE 0.2 MG/ML IJ SOLN
INTRAMUSCULAR | Status: DC | PRN
  Administered 2023-03-19: .6 mg via INTRAVENOUS

## 2023-03-19 MED ORDER — SUCCINYLCHOLINE CHLORIDE 200 MG/10ML IV SOSY
PREFILLED_SYRINGE | INTRAVENOUS | Status: DC | PRN
  Administered 2023-03-19: 120 mg via INTRAVENOUS

## 2023-03-19 MED ORDER — PROPOFOL 10 MG/ML IV BOLUS
INTRAVENOUS | Status: DC | PRN
  Administered 2023-03-19: 120 mg via INTRAVENOUS

## 2023-03-19 MED ORDER — LIDOCAINE HCL (CARDIAC) PF 100 MG/5ML IV SOSY
PREFILLED_SYRINGE | INTRAVENOUS | Status: DC | PRN
  Administered 2023-03-19: 50 mg via INTRAVENOUS

## 2023-03-19 MED ORDER — ACETAMINOPHEN 500 MG PO TABS
ORAL_TABLET | ORAL | Status: AC
  Administered 2023-03-19: 1000 mg via ORAL
  Filled 2023-03-19: qty 2

## 2023-03-19 MED ORDER — ONDANSETRON HCL 4 MG/2ML IJ SOLN
INTRAMUSCULAR | Status: AC
  Filled 2023-03-19: qty 2

## 2023-03-19 MED ORDER — ONDANSETRON HCL 4 MG/2ML IJ SOLN
INTRAMUSCULAR | Status: DC | PRN
  Administered 2023-03-19: 4 mg via INTRAVENOUS

## 2023-03-19 MED ORDER — LIDOCAINE HCL (PF) 1 % IJ SOLN: 30.0000 mL | Freq: Once | INTRAMUSCULAR | Status: DC

## 2023-03-19 MED ORDER — ONDANSETRON HCL 4 MG/2ML IJ SOLN: 4.0000 mg | Freq: Once | INTRAMUSCULAR | Status: DC | PRN

## 2023-03-19 MED ORDER — DEXAMETHASONE SODIUM PHOSPHATE 10 MG/ML IJ SOLN
INTRAMUSCULAR | Status: DC | PRN
  Administered 2023-03-19: 10 mg via INTRAVENOUS

## 2023-03-19 MED ORDER — FENTANYL CITRATE (PF) 100 MCG/2ML IJ SOLN
INTRAMUSCULAR | Status: DC | PRN
  Administered 2023-03-19 (×2): 25 ug via INTRAVENOUS

## 2023-03-19 MED ORDER — ACETAMINOPHEN 500 MG PO TABS: 1000.0000 mg | ORAL_TABLET | Freq: Once | ORAL | Status: AC

## 2023-03-19 MED ORDER — FENTANYL CITRATE (PF) 100 MCG/2ML IJ SOLN: 25.0000 ug | INTRAMUSCULAR | Status: DC | PRN

## 2023-03-19 MED ORDER — NEOSTIGMINE METHYLSULFATE 10 MG/10ML IV SOLN
INTRAVENOUS | Status: AC
  Filled 2023-03-19: qty 1

## 2023-03-19 MED ORDER — FENTANYL CITRATE (PF) 100 MCG/2ML IJ SOLN
INTRAMUSCULAR | Status: AC
  Filled 2023-03-19: qty 2

## 2023-03-19 MED ORDER — ROCURONIUM BROMIDE 100 MG/10ML IV SOLN
INTRAVENOUS | Status: DC | PRN
  Administered 2023-03-19: 40 mg via INTRAVENOUS
  Administered 2023-03-19: 20 mg via INTRAVENOUS
  Administered 2023-03-19: 10 mg via INTRAVENOUS

## 2023-03-19 MED ORDER — DEXAMETHASONE SODIUM PHOSPHATE 10 MG/ML IJ SOLN
INTRAMUSCULAR | Status: AC
  Filled 2023-03-19: qty 1

## 2023-03-19 MED ORDER — PHENYLEPHRINE HCL 0.25 % NA SOLN: 1.0000 | Freq: Four times a day (QID) | NASAL | Status: DC | PRN

## 2023-03-19 NOTE — Progress Notes (Signed)
Patient currently 88% on 2L [place] after getting up from [facility] to recliner. Patient and patient daughter states when she moves around at home she is normally in the 21s. Sitting at this moment she is 91% on 2L [place]. Dr. Lanney Gins notified. Per Dr. Lanney Gins discharge patient home with incentive spirometer and teaching of use. Patient and family verbalized understanding and patient demonstrated proper usage of incentive spirometer.

## 2023-03-19 NOTE — Anesthesia Postprocedure Evaluation (Signed)
Anesthesia Post Note  Patient: Alexandra [name]  Procedure(s) Performed: VIDEO BRONCHOSCOPY WITH ENDOBRONCHIAL ULTRASOUND ROBOTIC ASSISTED NAVIGATIONAL BRONCHOSCOPY  Patient location during evaluation: PACU Anesthesia Type: General Level of consciousness: awake and alert Pain management: pain level controlled Vital Signs Assessment: post-procedure vital signs reviewed and stable Respiratory status: spontaneous breathing, nonlabored ventilation, respiratory function stable and patient connected to nasal cannula oxygen Cardiovascular status: blood pressure returned to baseline and stable Postop Assessment: no apparent nausea or vomiting Anesthetic complications: no   No notable events documented.   Last Vitals:  Vitals:   03/19/23 1645 03/19/23 1700  BP: (!) 171/91 (!) 165/85  Pulse: 99 95  Resp: 20 20  Temp:  (!) 36.3 C  SpO2: 92% 92%    Last Pain:  Vitals:   03/19/23 1630  TempSrc:   PainSc: 0-No pain                 Precious Haws Dean Wonder

## 2023-03-19 NOTE — Progress Notes (Signed)
Patient brought to post op via stretcher, when patient was assisted up from stretcher and attempted to ambulate to bathroom. Patient stated she felt dizzy, patient was sat down and O2 checked, pulse ox at 77% on 2L [place]. Patient assisted to bedside commode. Dr. Lanney Gins notified and verbal order for DUO NEB x 1 given. Will update MD after breathing treatment.

## 2023-03-19 NOTE — Procedures (Signed)
ELECTROMAGNETIC NAVIGATIONAL BRONCHOSCOPY PROCEDURE NOTE  FIBEROPTIC BRONCHOSCOPY WITH BRONCHOALVEOLAR LAVAGE PROCEDURE NOTE  ENDOBRONCHIAL ULTRASOUND PROCEDURE NOTE    Flexible bronchoscopy was performed  by : Lanney Gins MD  assistance by : 1)Repiratory therapist  and 2)[employer] staff and 3) Anesthesia team and 4) Flouroscopy team and 5) [employer] supporting staff   Indication for the procedure was :  Pre-procedural H&P. The following assessment was performed on the day of the procedure prior to initiating sedation History:  Chest pain n Dyspnea y Hemoptysis n Cough y Fever n Other pertinent items n  Examination Vital signs -reviewed as per nursing documentation today Cardiac    Murmurs: n  Rubs : n  Gallop: n Lungs Wheezing: n Rales : n Rhonchi :y  Other pertinent findings: SOB/hypoxemia due to chronic lung disease   Pre-procedural assessment for Procedural Sedation included: Depth of sedation: As per anesthesia team  ASA Classification:  2 Mallampati airway assessment: 3    Medication list reviewed: y  The patient's interval history was taken and revealed: no new complaints The pre- procedure physical examination revealed: No new findings Refer to prior clinic note for details.  Informed Consent: Informed consent was obtained from:  patient after explanation of procedure and risks, benefits, as well as alternative procedures available.  Explanation of level of sedation and possible transfusion was also provided.    Procedural Preparation: Time out was performed and patient was identified by name and birthdate and procedure to be performed and side for sampling, if any, was specified. Pt was intubated by anesthesia.  The patient was appropriately draped.   Fiberoptic bronchoscopy with airway inspection and BAL Procedure findings:  Bronchoscope was inserted via ETT  without difficulty.  Posterior oropharynx, epiglottis, arytenoids, false cords and  vocal cords were not visualized as these were bypassed by endotracheal tube. The distal trachea was normal in circumference and appearance without mucosal, cartilaginous or branching abnormalities.  The main carina was mildly splayed . All right and left lobar airways were visualized to the Subsegmental level.  Sub- sub segmental carinae were identified in all the distal airways.   Secretions were visible in the following airways and appeared to be clear.  The mucosa was : friable at RUL  Airways were notable for:        exophytic lesions :n       extrinsic compression in the following distributions: n.       Friable mucosa: y       Neurosurgeon /pigmentation: n   Entire right lung from bronchus intermedius and lower was occluded with mucopurulent debris. The mucus plugging was extensive and required multiple aspirations.  BAL was performed at RLL and sent for gram stain and culture.  BAL was also performed at RUL for cytology  Post procedure Diagnosis:   mucus plugging of RUL     ROBOTIC Navigational Bronchoscopy Procedure Findings:    Post appropriate planning and registration peripheral navigation was used to visualize target lesion.    CYTOBRUSH X 4 AT - RUL - LESIONAL CARCINOMA SURGICAL PATHOLOGY - TRANSBRONCHIAL BIOPSY X 10 - LESIONAL CARCINOMA  Post procedure diagnosis: LUNG CANCER      Endobronchial ultrasound assisted hilar and mediastinal lymph node biopsies procedure findings: The fiberoptic bronchoscope was removed and the EBUS scope was introduced. Examination began to evaluate for pathologically enlarged lymph nodes starting on the LEFT side progressing to the RIGHT side.  All lymph node biopsies performed with 21G needle. Lymph node biopsies were sent in  cytolite for all stations.   STATION 4L - 27mm - NOT BIOPSIED STATION 10L - 53mm - NOT BIOPSIED STAITON 7 - 43mm - BIOPSIED 4 TIMES STATION 10R- 58mm - BIOPSIED 3 TIMES STATION 4R - 88mm - BIOPSIED 3 TIMES    Post procedure diagnosis:  MEDIASTINAL LYMPHADENOPATHY SUSPICIOUS FOR METASTATIS   Specimens obtained included:   Immediate sampling complications included:none  Epinephrine ZERO ml was used topically  The bronchoscopy was terminated due to completion of the planned procedure and the bronchoscope was removed.   Total dosage of Lidocaine was ZERO mg Total fluoroscopy time was AS PER RADIOLOGY  minutes  Supplemental oxygen was provided at AS PER ANESTHESIA  lpm by nasal canula post operatively  Estimated Blood loss: <10CC EXPECTED.  Complications included:  NONE IMMEDIATE   Preliminary CXR findings :  IN PROCESS   Disposition: HOME WITH FAMILY  Follow up with Dr. Lanney Gins in 5 days for result discussion.     Ottie Glazier MD  [facility] Division of Pulmonary & Critical Care Medicine

## 2023-03-19 NOTE — H&P (Signed)
PULMONOLOGY         Date: 03/19/2023,   MRN# [id number] Alexandra [name] 07/17/1941     Admission                  Current   Referring provider: Dr Raul Del   CHIEF COMPLAINT:   Right upper lobe spiculated nodule with mediastinal lymphadenoapthy   HISTORY OF PRESENT ILLNESS   Alexandra [name] is a very pleasant 82 year old female who came in to outpatient pulmonology clinic for COPD follow-up.  She does have chronic hypoxemia and is on supplemental oxygen at baseline.  She does use inhaler therapy for COPD and is compliant with her medications.  Currently she takes [employer] twice daily as well as albuterol as needed she does have some anxiety in the background and uses alprazolam twice daily as needed also takes Elavil for depression in the evening times.  She does have neuropathy and is on gabapentin 3 times daily at a high dose of 1200 mg 3 times daily.  She also suffers from insomnia and takes Ambien nightly.  She has chronic pain and takes narcotics up to 4 times daily as needed.  She was noted to have follow-up CT chest with spiculated right upper lobe lesion consistent with possible bronchogenic carcinoma as well as mediastinal lymphadenopathy.  Patient is fully independent and met with Dr. Raul Del her primary pulmonologist expressed wishes to have additional workup, diagnostic and therapeutic intervention for underlying carcinoma.  Patient is here today to have airway inspection with bronchoscopy, therapeutic aspiration of mucous plugging as well as bronchoalveolar lavage and robotic assisted transbronchial and endobronchial biopsies of spiculated right upper lobe lesion as well as endobronchial ultrasound assisted lymph node biopsies. Reviewed risks/complications and benefits with patient, risks include infection, pneumothorax/pneumomediastinum which may require chest tube placement as well as overnight/prolonged hospitalization and possible mechanical ventilation. Other risks  include bleeding and very rarely death.  Patient understands risks and wishes to proceed.  Additional questions were answered, and patient is aware that post procedure patient will be going home with family and may experience cough with possible clots on expectoration as well as phlegm which may last few days as well as hoarseness of voice post intubation and mechanical ventilation.    PAST MEDICAL HISTORY   Past Medical History:  Diagnosis Date   COPD (chronic obstructive pulmonary disease) ([place])    Depression    GERD (gastroesophageal reflux disease)    Hyperlipidemia    Pulmonary nodules/lesions, multiple    Right femoral fracture ([place])      SURGICAL HISTORY   Past Surgical History:  Procedure Laterality Date   ABDOMINAL HYSTERECTOMY  1975   COLONOSCOPY     COLOSTOMY  2012   repaired and reduced in 2013   intestinal rupture  2012   INTRAMEDULLARY (IM) NAIL INTERTROCHANTERIC Right 12/17/2021   Procedure: INTRAMEDULLARY (IM) NAIL INTERTROCHANTRIC;  Surgeon: Renee Harder, MD;  Location: [facility] ORS;  Service: Orthopedics;  Laterality: Right;   TAKE DOWN OF INTESTINAL FISTULA  2013   TUMOR EXCISION  2013   hospitalized and drained.      FAMILY HISTORY   Family History  Problem Relation Age of Onset   Heart disease Mother    Heart disease Father      SOCIAL HISTORY   Social History   Tobacco Use   Smoking status: Former    Packs/day: 1.00    Years: 37.00    Additional pack years: 0.00  Total pack years: 37.00    Types: Cigarettes    Quit date: 2012    Years since quitting: 12.2   Smokeless tobacco: Never   Tobacco comments:    stopped in 2021  Vaping Use   Vaping Use: Never used  Substance Use Topics   Alcohol use: Never   Drug use: Never     MEDICATIONS    Home Medication:    Current Medication:  Current Facility-Administered Medications:    chlorhexidine (PERIDEX) 0.12 % solution 15 mL, 15 mL, Mouth/Throat, Once **OR** Oral care mouth  rinse, 15 mL, Mouth Rinse, Once, Darrin Nipper, MD   lactated ringers infusion, , Intravenous, Continuous, Darrin Nipper, MD    ALLERGIES   Avelox [moxifloxacin hcl] and Elemental sulfur     REVIEW OF SYSTEMS    Review of Systems:  Gen:  Denies  fever, sweats, chills weigh loss  HEENT: Denies blurred vision, double vision, ear pain, eye pain, hearing loss, nose bleeds, sore throat Cardiac:  No dizziness, chest pain or heaviness, chest tightness,edema Resp:   reports dyspnea chronically  Gi: Denies swallowing difficulty, stomach pain, nausea or vomiting, diarrhea, constipation, bowel incontinence Gu:  Denies bladder incontinence, burning urine Ext:   Denies Joint pain, stiffness or swelling Skin: Denies  skin rash, easy bruising or bleeding or hives Endoc:  Denies polyuria, polydipsia , polyphagia or weight change Psych:   Denies depression, insomnia or hallucinations   Other:  All other systems negative   VS: There were no vitals taken for this visit.     PHYSICAL EXAM    GENERAL:NAD, no fevers, chills, no weakness no fatigue HEAD: Normocephalic, atraumatic.  EYES: Pupils equal, round, reactive to light. Extraocular muscles intact. No scleral icterus.  MOUTH: Moist mucosal membrane. Dentition intact. No abscess noted.  EAR, NOSE, THROAT: Clear without exudates. No external lesions.  NECK: Supple. No thyromegaly. No nodules. No JVD.  PULMONARY: decreased breath sounds with mild rhonchi worse at bases bilaterally.  CARDIOVASCULAR: S1 and S2. Regular rate and rhythm. No murmurs, rubs, or gallops. No edema. Pedal pulses 2+ bilaterally.  GASTROINTESTINAL: Soft, nontender, nondistended. No masses. Positive bowel sounds. No hepatosplenomegaly.  MUSCULOSKELETAL: No swelling, clubbing, or edema. Range of motion full in all extremities.  NEUROLOGIC: Cranial nerves II through XII are intact. No gross focal neurological deficits. Sensation intact. Reflexes intact.  SKIN: No  ulceration, lesions, rashes, or cyanosis. Skin warm and dry. Turgor intact.  PSYCHIATRIC: Mood, affect within normal limits. The patient is awake, alert and oriented x 3. Insight, judgment intact.       IMAGING   Reviewed [facility] protocol super D CAT scan with spiculated 13 mm lesion of the right upper lobe and mediastinal lymphadenopathy.  There was also a 12 mm right lower lobe nodule of concern previously seen which has decreased on this study to 50% of its previous size now measuring at 6 mm and is unlikely inflammatory/infectious/mucoid in etiology.  ASSESSMENT/PLAN    37mm Right upper lobe spiculated nodule with mediastinal lymphadenopathy       -Patient is here today for airway inspection via flexible bronchoscopy.  Her CT scan dated 03/14/2023 does show endobronchial debris of the right lower lobe which was thought to be possible aspiration versus airway impaction by radiologist.  This has decreased in the interval from previous CT since February 2024.  She does have background of centrilobular paraseptal emphysema with bronchiectasis.  Is a concerning nodule of the right upper lobe which is spiculated and hypermetabolic  on PET scan which is likely bronchogenic carcinoma.  Will also perform robotic assisted endobronchial and transbronchial biopsies of hypermetabolic lesion of the right upper lobe.  Bronchoalveolar lavage was performed of the right upper lobe for cytology.  Endobronchial assisted lymph node biopsies will be performed for staging purposes. -Reviewed risks/complications and benefits with patient, risks include infection, pneumothorax/pneumomediastinum which may require chest tube placement as well as overnight/prolonged hospitalization and possible mechanical ventilation. Other risks include bleeding and very rarely death.  Patient understands risks and wishes to proceed.  Additional questions were answered, and patient is aware that post procedure patient will be going home with  family and may experience cough with possible clots on expectoration as well as phlegm which may last few days as well as hoarseness of voice post intubation and mechanical ventilation.             Thank you for allowing me to participate in the care of this patient.   Patient/Family are satisfied with care plan and all questions have been answered.    Provider disclosure: Patient with at least one acute or chronic illness or injury that poses a threat to life or bodily function and is being managed actively during this encounter.  All of the below services have been performed independently by signing provider:  review of prior documentation from internal and or external health records.  Review of previous and current lab results.  Interview and comprehensive assessment during patient visit today. Review of current and previous chest radiographs/CT scans. Discussion of management and test interpretation with health care team and patient/family.   This document was prepared using Dragon voice recognition software and may include unintentional dictation errors.     Ottie Glazier, M.D.  Division of Pulmonary & Critical Care Medicine

## 2023-03-19 NOTE — Anesthesia Preprocedure Evaluation (Signed)
Anesthesia Evaluation  Patient identified by MRN, date of birth, ID band Patient awake    Reviewed: Allergy & Precautions, NPO status , Patient's Chart, lab work & pertinent test results  History of Anesthesia Complications Negative for: history of anesthetic complications  Airway Mallampati: II  TM Distance: >3 FB Neck ROM: Full    Dental  (+) Poor Dentition, Missing, Dental Advidsory Given   Pulmonary shortness of breath, with exertion and Long-Term Oxygen Therapy, neg sleep apnea, COPD,  oxygen dependent, neg recent URI, Patient abstained from smoking.Not current smoker, former smoker   Pulmonary exam normal breath sounds clear to auscultation       Cardiovascular Exercise Tolerance: Good METS(-) hypertension(-) angina (-) CAD and (-) Past MI negative cardio ROS (-) dysrhythmias  Rhythm:Regular Rate:Normal - Systolic murmurs    Neuro/Psych  PSYCHIATRIC DISORDERS  Depression    negative neurological ROS     GI/Hepatic ,GERD  Medicated and Controlled,,(+)     (-) substance abuse    Endo/Other  neg diabetes    Renal/GU negative Renal ROS     Musculoskeletal   Abdominal   Peds  Hematology   Anesthesia Other Findings Past Medical History: No date: COPD (chronic obstructive pulmonary disease) (HCC) No date: Depression  Reproductive/Obstetrics                             Anesthesia Physical Anesthesia Plan  ASA: 4  Anesthesia Plan: General   Post-op Pain Management: Ofirmev IV (intra-op), Gabapentin PO (pre-op) and Celebrex PO (pre-op)   Induction: Intravenous  PONV Risk Score and Plan: 2 and Ondansetron, Dexamethasone and Treatment may vary due to age or medical condition  Airway Management Planned: Oral ETT  Additional Equipment: None  Intra-op Plan:   Post-operative Plan: Extubation in OR  Informed Consent: I have reviewed the patients History and Physical, chart, labs and  discussed the procedure including the risks, benefits and alternatives for the proposed anesthesia with the patient or authorized representative who has indicated his/her understanding and acceptance.       Plan Discussed with: CRNA and Surgeon  Anesthesia Plan Comments: (Discussed R/B/A of neuraxial anesthesia technique with patient: - rare risks of spinal/epidural hematoma, nerve damage, infection - Risk of PDPH - Risk of nausea and vomiting - Risk of conversion to general anesthesia and its associated risks, including sore throat, damage to lips/eyes/teeth/oropharynx, and rare risks such as cardiac and respiratory events. - Risk of allergic reactions  Discussed the role of CRNA in patient's perioperative care.  Patient voiced understanding.)        Anesthesia Quick Evaluation

## 2023-03-19 NOTE — Discharge Instructions (Signed)

## 2023-03-19 NOTE — Anesthesia Procedure Notes (Signed)
Procedure Name: Intubation Date/Time: 03/19/2023 1:02 PM  Performed by: Debe Coder, CRNAPre-anesthesia Checklist: Patient identified, Emergency Drugs available, Suction available and Patient being monitored Patient Re-evaluated:Patient Re-evaluated prior to induction Oxygen Delivery Method: Circle system utilized Preoxygenation: Pre-oxygenation with 100% oxygen Induction Type: IV induction Ventilation: Mask ventilation without difficulty Laryngoscope Size: McGraph and 4 Grade View: Grade I Tube type: Oral Tube size: 9.0 mm Number of attempts: 1 Airway Equipment and Method: Stylet Placement Confirmation: ETT inserted through vocal cords under direct vision, positive ETCO2 and breath sounds checked- equal and bilateral Secured at: 21 cm Tube secured with: Tape Dental Injury: Teeth and Oropharynx as per pre-operative assessment

## 2023-03-19 NOTE — Transfer of Care (Signed)
Immediate Anesthesia Transfer of Care Note  Patient: Alexandra [name]  Procedure(s) Performed: VIDEO BRONCHOSCOPY WITH ENDOBRONCHIAL ULTRASOUND ROBOTIC ASSISTED NAVIGATIONAL BRONCHOSCOPY  Patient Location: PACU  Anesthesia Type:General  Level of Consciousness: awake, alert , and oriented  Airway & Oxygen Therapy: Patient Spontanous Breathing and Patient connected to face mask oxygen  Post-op Assessment: Report given to RN and Post -op Vital signs reviewed and stable  Post vital signs: Reviewed and stable  Last Vitals:  Vitals Value Taken Time  BP 174/79 03/19/23 1602  Temp    Pulse 96 03/19/23 1602  Resp 21 03/19/23 1602  SpO2 98 % 03/19/23 1602    Last Pain:  Vitals:   03/19/23 1111  TempSrc: Tympanic  PainSc: 0-No pain         Complications: No notable events documented.

## 2023-03-20 ENCOUNTER — Encounter: Payer: Self-pay | Admitting: Pulmonary Disease

## 2023-03-20 LAB — CULTURE, BAL-QUANTITATIVE W GRAM STAIN: Culture: >=100000 — AB

## 2023-03-21 ENCOUNTER — Other Ambulatory Visit: Payer: Self-pay | Admitting: Pathology

## 2023-03-21 LAB — CYTOLOGY - NON PAP

## 2023-03-21 LAB — SURGICAL PATHOLOGY

## 2023-03-22 ENCOUNTER — Other Ambulatory Visit: Payer: PPO

## 2023-03-22 LAB — ACID FAST SMEAR (AFB, MYCOBACTERIA): Acid Fast Smear: NEGATIVE

## 2023-03-27 ENCOUNTER — Encounter: Payer: Self-pay | Admitting: *Deleted

## 2023-03-27 ENCOUNTER — Inpatient Hospital Stay: Payer: PPO | Admitting: Licensed Clinical Social Worker

## 2023-03-27 ENCOUNTER — Ambulatory Visit
Admission: RE | Admit: 2023-03-27 | Discharge: 2023-03-27 | Disposition: A | Discharge status: Home / Self Care | Payer: PPO | Source: Ambulatory Visit | Attending: Radiation Oncology | Admitting: Radiation Oncology

## 2023-03-27 ENCOUNTER — Encounter: Payer: Self-pay | Admitting: Oncology

## 2023-03-27 ENCOUNTER — Inpatient Hospital Stay: Payer: PPO | Attending: Oncology | Admitting: Oncology

## 2023-03-27 VITALS — BP 140/80 | HR 97 | Temp 96.5°F | Resp 20 | Ht 69.0 in | Wt 143.0 lb

## 2023-03-27 DIAGNOSIS — C3411 Malignant neoplasm of upper lobe, right bronchus or lung: Secondary | ICD-10-CM

## 2023-03-27 DIAGNOSIS — Z7189 Other specified counseling: Secondary | ICD-10-CM

## 2023-03-27 DIAGNOSIS — Z9981 Dependence on supplemental oxygen: Secondary | ICD-10-CM | POA: Diagnosis not present

## 2023-03-27 DIAGNOSIS — J449 Chronic obstructive pulmonary disease, unspecified: Secondary | ICD-10-CM

## 2023-03-27 DIAGNOSIS — R918 Other nonspecific abnormal finding of lung field: Secondary | ICD-10-CM

## 2023-03-27 DIAGNOSIS — Z87891 Personal history of nicotine dependence: Secondary | ICD-10-CM | POA: Diagnosis not present

## 2023-03-27 NOTE — Progress Notes (Signed)
Pt and daughter in for new patient visit.  Pt referred by Dr Karna Christmas for non small cell carcinoma of lung.

## 2023-03-27 NOTE — Progress Notes (Signed)
[facility] Clinical Social Work  Initial Assessment   Alexandra [name] is a 82 y.o. year old female contacted by phone. Clinical Social Work was referred by medical provider for assessment of psychosocial needs.   SDOH (Social Determinants of Health) assessments performed: Yes SDOH Interventions    Flowsheet Row Clinical Support from 03/27/2023 in [facility] at [facility]  SDOH Interventions   Housing Interventions Intervention Not Indicated  Alcohol Usage Interventions Intervention Not Indicated (Score <7)  Financial Strain Interventions Intervention Not Indicated, Other (Comment)  [financial assistance - grant fund]  Physical Activity Interventions Intervention Not Indicated  Stress Interventions Intervention Not Indicated  Social Connections Interventions Intervention Not Indicated       SDOH Screenings   Food Insecurity: No Food Insecurity (03/27/2023)  Housing: Low Risk  (03/27/2023)  Transportation Needs: No Transportation Needs (03/27/2023)  Utilities: Not At Risk (03/27/2023)  Alcohol Screen: Low Risk  (03/27/2023)  Depression (PHQ2-9): Low Risk  (03/27/2023)  Financial Resource Strain: Low Risk  (03/27/2023)  Physical Activity: Inactive (03/27/2023)  Social Connections: Socially Integrated (03/27/2023)  Stress: No Stress Concern Present (03/27/2023)  Tobacco Use: Medium Risk (03/27/2023)     Distress Screen completed: No    03/27/2023   10:41 AM  ONCBCN DISTRESS SCREENING  Screening Type Initial Screening  Distress experienced in past week (1-10) 0      Family/Social Information:  Housing Arrangement: patient lives with spouse  [place], [place] [phone]  Family members/support persons in your life? Family, Friends, Social worker, and Ambulance person concerns: no  Employment: Retired  .  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 yes Concerns about diagnosis and/or treatment: Quality of life Patient reported stressors: Anxiety/ nervousness and Adjusting to my illness Hopes and/or priorities:   Patient enjoys  N/A Current coping skills/ strengths: Average or above average intelligence , Capable of independent living , Communication skills , Financial means , General fund of knowledge , Motivation for treatment/growth , Religious Affiliation , Special hobby/interest , Supportive family/friends , and Work skills     SUMMARY: Current SDOH Barriers:  No immediate SDOH concerns present  Clinical Social Work Clinical Goal(s):  No clinical social work goals at this time  Interventions: Discussed common feeling and emotions when being diagnosed with cancer, and the importance of support during treatment Informed patient of the support team roles and support services at [facility] Provided CSW contact information and encouraged patient to call with any questions or concerns Referred patient to [employer] financial assistance and Provided patient with information about CSW role inpatient care, calendar events/groups and advance directives.  CSW will leave advance directive information and calendar for patient at front desk to pick up at next appointment on 04/04/2023   Follow Up Plan: Patient will contact CSW with any support or resource needs Patient verbalizes understanding of plan: Yes    Joseph Art, LCSW

## 2023-03-27 NOTE — Progress Notes (Signed)
Hematology/Oncology Consult note [facility] [phone] [phone]  Patient Care Team: Dortha KernBliss, Laura K, MD as PCP - General (Family Medicine)   Name of the patient: Alexandra BlankDoris [name]  [id number]  03/09/1941    Reason for referral-newly diagnosed adenocarcinoma of the lung   Referring physician-Dr.Aleskerov  Date of visit: 03/27/23   History of presenting illness- Patient is a 82 year old female with a past medical history is significant for 45-pack-year smoking and who has been on home oxygen 2 L.  She has a history of severe COPD.  She underwent CT chest lung cancer screening protocol in February 2024 which was concerning for a spiculated nodule in the right apex.  This was followed by PET CT scan on 02/14/2023 which showed right upper lobe pulmonary nodule 1.3 cm hypermetabolic with an SUV of 12.67.  No enlarged mediastinal or hilar lymphadenopathy.  No evidence of supraclavicular or axillary adenopathy.  12 mm subsolid right lower lobe lymph node with an FDG of 1.6 and there was another nodular density in the right lower lobe with an SUV of 1.9 which was deemed to be indeterminate.  She had a CT super D chest protocol on 03/13/2023 which showed similar findings.  Patient had bronchoscopy with Dr. Karna ChristmasAleskerov and right upper lobe lung biopsy was consistent with non-small cell carcinoma favoring adenocarcinoma.  Station 4R, 10 R lymph node biopsy was negative for malignancy.  Station 7 lymph node biopsy was suspicious for malignancy with single atypical epithelial group present in the cellblock but not diagnostic.  Bronchial lavage was also positive for Moraxella catarrhalis and patient completed course of antibiotics.  Patient overall does not feel back to baseline despite completing her antibiotic course.  She reports easy fatigue.  She is here with her daughter today.  ECOG PS- 2  Pain scale- 0   Review of systems- Review of Systems  Constitutional:   Positive for malaise/fatigue.  Respiratory:  Positive for shortness of breath.     Allergies  Allergen Reactions   Avelox [Moxifloxacin Hcl] Shortness Of Breath and Swelling   Moxifloxacin Anaphylaxis   Cefadroxil Itching   Elemental Sulfur Other (See Comments)    Reaction as a child and not sure what.     Patient Active Problem List   Diagnosis Date Noted   Malignant neoplasm of upper lobe of right lung 03/27/2023   Right femoral fracture 12/16/2021   Depression 12/16/2021   Insomnia 12/16/2021   GERD (gastroesophageal reflux disease) 12/16/2021   Hyperlipidemia 12/16/2021   Polypharmacy 12/16/2021   Pulmonary nodules/lesions, multiple 12/16/2021   Torus palatinus 12/16/2021   Urticaria 12/16/2021   S/P hernia repair 10/18/2012   Anxiety 10/16/2012   Neuropathy 10/16/2012   Peptic ulcer 10/16/2012   Perforated sigmoid colon 10/16/2012   Rectovaginal fistula 10/16/2012   Ventral hernia 10/16/2012     Past Medical History:  Diagnosis Date   Anxiety    COPD (chronic obstructive pulmonary disease)    Depression    GERD (gastroesophageal reflux disease)    Hyperlipidemia    Pulmonary nodules/lesions, multiple    Right femoral fracture      Past Surgical History:  Procedure Laterality Date   ABDOMINAL HYSTERECTOMY  1975   COLONOSCOPY     COLOSTOMY  2012   repaired and reduced in 2013   intestinal rupture  2012   INTRAMEDULLARY (IM) NAIL INTERTROCHANTERIC Right 12/17/2021   Procedure: INTRAMEDULLARY (IM) NAIL INTERTROCHANTRIC;  Surgeon: Ross Marcusrawford, Matthew, MD;  Location: [facility] ORS;  Service:  Orthopedics;  Laterality: Right;   TAKE DOWN OF INTESTINAL FISTULA  2013   TUMOR EXCISION  2013   hospitalized and drained.    VIDEO BRONCHOSCOPY WITH ENDOBRONCHIAL ULTRASOUND N/A 03/19/2023   Procedure: VIDEO BRONCHOSCOPY WITH ENDOBRONCHIAL ULTRASOUND;  Surgeon: Vida Rigger, MD;  Location: [facility] ORS;  Service: Thoracic;  Laterality: N/A;    Social History   Socioeconomic  History   Marital status: Married    Spouse name: Not on file   Number of children: Not on file   Years of education: Not on file   Highest education level: Not on file  Occupational History   Not on file  Tobacco Use   Smoking status: Former    Packs/day: 1.00    Years: 37.00    Additional pack years: 0.00    Total pack years: 37.00    Types: Cigarettes    Quit date: 2012    Years since quitting: 12.2   Smokeless tobacco: Never   Tobacco comments:    stopped in 2021  Vaping Use   Vaping Use: Never used  Substance and Sexual Activity   Alcohol use: Never   Drug use: Never   Sexual activity: Not Currently  Other Topics Concern   Not on file  Social History Narrative   Not on file   Social Determinants of Health   Financial Resource Strain: Not on file  Food Insecurity: No Food Insecurity (03/27/2023)   Hunger Vital Sign    Worried About Running Out of Food in the Last Year: Never true    Ran Out of Food in the Last Year: Never true  Transportation Needs: No Transportation Needs (03/27/2023)   PRAPARE - Administrator, Civil Service (Medical): No    Lack of Transportation (Non-Medical): No  Physical Activity: Not on file  Stress: Not on file  Social Connections: Not on file  Intimate Partner Violence: Not At Risk (03/27/2023)   Humiliation, Afraid, Rape, and Kick questionnaire    Fear of Current or Ex-Partner: No    Emotionally Abused: No    Physically Abused: No    Sexually Abused: No     Family History  Problem Relation Age of Onset   Heart disease Mother    Heart disease Father      Current Outpatient Medications:    albuterol (VENTOLIN HFA) 108 (90 Base) MCG/ACT inhaler, Inhale 1-2 puffs into the lungs every 6 (six) hours as needed for wheezing or shortness of breath., Disp: , Rfl:    amitriptyline (ELAVIL) 50 MG tablet, Take 50 mg by mouth at bedtime., Disp: , Rfl:    aspirin 81 MG chewable tablet, Chew 81 mg by mouth daily., Disp: , Rfl:     Budeson-Glycopyrrol-Formoterol (BREZTRI AEROSPHERE) 160-9-4.8 MCG/ACT AERO, Inhale 1 puff into the lungs 2 (two) times daily., Disp: , Rfl:    celecoxib (CELEBREX) 200 MG capsule, Take 200 mg by mouth 2 (two) times daily., Disp: , Rfl:    cholecalciferol (VITAMIN D3) 25 MCG (1000 UNIT) tablet, Take 1,000 Units by mouth daily., Disp: , Rfl:    CYCLOBENZAPRINE HCL PO, Take 1 tablet by mouth as needed., Disp: , Rfl:    gabapentin (NEURONTIN) 600 MG tablet, Take 1,200 mg by mouth 3 (three) times daily., Disp: , Rfl:    HYDROcodone-acetaminophen (NORCO) 10-325 MG tablet, Take 1 tablet by mouth 4 (four) times daily as needed for moderate pain or severe pain., Disp: , Rfl:    latanoprost (XALATAN) 0.005 % ophthalmic  solution, Place 1 drop into both eyes at bedtime., Disp: , Rfl:    OXYGEN, Inhale 2 L into the lungs continuous., Disp: , Rfl:    pantoprazole (PROTONIX) 40 MG tablet, Take 40 mg by mouth 2 (two) times daily., Disp: , Rfl:    simvastatin (ZOCOR) 40 MG tablet, Take 40 mg by mouth every evening., Disp: , Rfl:    zolpidem (AMBIEN) 10 MG tablet, Take 0.5 tablets (5 mg total) by mouth at bedtime., Disp: 30 tablet, Rfl: 0   Physical exam:  Vitals:   03/27/23 1052  BP: (!) 140/80  Pulse: 97  Resp: 20  Temp: (!) 96.5 F (35.8 C)  TempSrc: Tympanic  SpO2: 90%  Weight: 143 lb (64.9 kg)  Height: 5\' 9"  (1.753 m)   Physical Exam Cardiovascular:     Rate and Rhythm: Normal rate and regular rhythm.     Heart sounds: Normal heart sounds.  Pulmonary:     Comments: Scattered bilateral rhonchi at the bases.  Effort of breathing increased Abdominal:     General: Bowel sounds are normal.     Palpations: Abdomen is soft.  Skin:    General: Skin is warm and dry.  Neurological:     Mental Status: She is alert and oriented to person, place, and time.           Latest Ref Rng & Units 03/16/2023   12:00 PM  CMP  Glucose 70 - 99 mg/dL 88   BUN 8 - 23 mg/dL 18   Creatinine 1.61 - 1.00 mg/dL  0.96   Sodium 045 - 409 mmol/L 138   Potassium 3.5 - 5.1 mmol/L 4.3   Chloride 98 - 111 mmol/L 95   CO2 22 - 32 mmol/L 32   Calcium 8.9 - 10.3 mg/dL 9.3       Latest Ref Rng & Units 03/16/2023   12:00 PM  CBC  WBC 4.0 - 10.5 K/uL 10.2   Hemoglobin 12.0 - 15.0 g/dL 81.1   Hematocrit 91.4 - 46.0 % 33.2   Platelets 150 - 400 K/uL 464     No images are attached to the encounter.  DG Chest Port 1 View  Result Date: 03/19/2023 CLINICAL DATA:  Status post bronchoscopy. EXAM: PORTABLE CHEST 1 VIEW COMPARISON:  CT, 03/13/2023.  Radiographs, 04/06/2022. FINDINGS: No pneumothorax. Hazy opacity projects in the right upper lobe above the minor fissure. There is interstitial and vascular prominence throughout the right upper lobe. These findings represent a change from prior radiographs and prior CT. Remainder of the lungs is clear. Blunting of the lateral right costophrenic sulcus is stable from prior radiographs. Lungs are hyperexpanded. No convincing pleural effusion. Cardiac silhouette normal in size. No mediastinal masses. Right hilar adenopathy noted on the prior CT is not well appreciated radiographically. Left hilar contours are normal. IMPRESSION: 1. Vascular and interstitial prominence and hazy opacity noted in the right upper lobe following bronchoscopy, which may primarily be due to atelectasis. 2. No evidence of a pneumothorax and no other change following bronchoscopy. Electronically Signed   By: Amie Portland M.D.   On: 03/19/2023 16:40   DG C-Arm 1-60 Min-No Report  Result Date: 03/19/2023 Fluoroscopy was utilized by the requesting physician.  No radiographic interpretation.   DG C-Arm 1-60 Min-No Report  Result Date: 03/19/2023 Fluoroscopy was utilized by the requesting physician.  No radiographic interpretation.   DG C-Arm 1-60 Min-No Report  Result Date: 03/19/2023 Fluoroscopy was utilized by the requesting physician.  No radiographic  interpretation.   CT SUPER D CHEST WO MONARCH  PILOT  Result Date: 03/14/2023 CLINICAL DATA:  Pre-procedure planning.  Pulmonary nodule. EXAM: CT CHEST WITHOUT CONTRAST TECHNIQUE: Multidetector CT imaging of the chest was performed using thin slice collimation for electromagnetic bronchoscopy planning purposes, without intravenous contrast. RADIATION DOSE REDUCTION: This exam was performed according to the departmental dose-optimization program which includes automated exposure control, adjustment of the mA and/or kV according to patient size and/or use of iterative reconstruction technique. COMPARISON:  PET-CT 02/14/2023 FINDINGS: Cardiovascular: The heart size is normal. No substantial pericardial effusion. Coronary artery calcification is evident. Mild atherosclerotic calcification is noted in the wall of the thoracic aorta. Mediastinum/Nodes: Scattered small mediastinal lymph nodes evident, not hypermetabolic on recent PET-CT. No evidence for gross hilar lymphadenopathy although assessment is limited by the lack of intravenous contrast on the current study. The esophagus has normal imaging features. There is no axillary lymphadenopathy. Lungs/Pleura: Centrilobular and paraseptal emphysema evident. 13 mm spiculated nodule identified posterior right upper lobe, tethered to the posterior pleura, corresponding to the hypermetabolic nodule seen on recent PET-CT. 12 mm right lower lobe nodule of concern on previous lung cancer screening CT has decreased in the interval measuring 6 mm today on image 71/3 and appearing less confluent. Interval increase in consolidative opacity seen in the paraspinal right lung base (image 128/3). Pneumonia or even aspiration could have this appearance, especially given the new posterior right lower lobe airway impaction (image 118/3) since prior CT of 01/31/2023. Scattered subpleural reticulation is evident. Lower lung predominant mild bronchiectasis with circumferential bronchial wall thickening and peripheral tree-in-bud  nodularity suggests sequelae of chronic atypical infection. There is no evidence of pleural effusion. Upper Abdomen: Visualized portion of the upper abdomen is unremarkable. Musculoskeletal: No worrisome lytic or sclerotic osseous abnormality. IMPRESSION: 1. 13 mm spiculated nodule posterior right upper lobe, tethered to the posterior pleura, corresponding to the hypermetabolic nodule seen on recent PET-CT. Imaging features compatible with primary bronchogenic neoplasm. 2. Scattered small mediastinal lymph nodes, not hypermetabolic on recent PET-CT. 3. New consolidative opacity in the posteromedial right lower lobe associated with new posterior right lower lobe airway impaction. Pneumonia and/or aspiration a consideration 4. Lower lung predominant mild bronchiectasis with circumferential bronchial wall thickening and peripheral tree-in-bud nodularity suggests sequelae of chronic atypical infection. 5. Aortic Atherosclerosis (ICD10-I70.0) and Emphysema (ICD10-J43.9). Electronically Signed   By: Kennith Center M.D.   On: 03/14/2023 12:47    Assessment and plan- Patient is a 82 y.o. female referred for new diagnosis of adenocarcinoma of the right lung  I have reviewed PET CT scan and CT chest images independently and discussed findings with the patient and her daughter.  Patient found to have a spiculated hypermetabolic right upper lobe lung nodule 13 mm that is adherent to the pleura.  This was biopsy-proven adenocarcinoma.  There was no evidence of suspicious hilar or mediastinal adenopathy noted on CT or PET scan.  Station 4R and 10 R biopsy was negative for malignancy on bronchoscopy.  However station 7 was suspicious but not diagnostic.  It is therefore unclear if station 7 lymph nodes truly involved or not.  Patient has baseline severe COPD and is oxygen dependent.  She still feels quite fatigued and has exertional shortness of breath.  Given that we do not have a clear diagnosis of lymph node involvement  which could potentially upstage her to stage IIb T2 N1, I will treat her as a stage I T2 N0 disease at this time based on  her pathology results.  I would favor proceeding with SBRT at this time with a close follow-up and surveillance scans to look at her mediastinal adenopathy in the future.  If there is any concern for growth in the size of this hilar or mediastinal lymph nodes we could consider treating her for stage II lung cancer with definitive concurrent chemoradiation in the future.  We will also be discussing her case at tumor board today and patient will be seen by radiation oncology today.  Treatment will be given with the potential curative intent.  I will also plan to keep an eye on her right lower lobe lung nodules which were mildly hypermetabolic but could also be reactive/inflammatory.   Cancer Staging  Malignant neoplasm of upper lobe of right lung Staging form: Lung, AJCC 8th Edition - Clinical stage from 03/27/2023: Stage IB (cT2a, cN0, cM0) - Signed by Creig Hines, MD on 03/27/2023 Histopathologic type: Adenocarcinoma, NOS     Thank you for this kind referral and the opportunity to participate in the care of this patient   Visit Diagnosis 1. Malignant neoplasm of upper lobe of right lung   2. Goals of care, counseling/discussion     Dr. Owens Shark, MD, MPH [facility] at [facility] [phone] 03/27/2023

## 2023-03-27 NOTE — Consult Note (Signed)
NEW PATIENT EVALUATION  Name: Alexandra [name]  MRN: [id number]  Date:   03/27/2023     DOB: [birth year]   This 82 y.o. female patient presents to the clinic for initial evaluation of right lung nodule biopsy positive for non-small cell lung cancer favoring adenocarcinoma  REFERRING PHYSICIAN: [name], MD  CHIEF COMPLAINT:  Chief Complaint  Patient presents with   pulmonary nodules multiple    DIAGNOSIS: The encounter diagnosis was Pulmonary nodules/lesions, multiple.   PREVIOUS INVESTIGATIONS:  PET CT and CT scans reviewed Clinical notes reviewed Pathology reports reviewed  HPI: Patient is a 82 year old female with history of oxygen dependent COPD.  They have been tracking a right upper lobe nodule which has been present dating back to December 2022.  She has innumerable small pulmonary nodules with peripheral predominance and tree-in-bud configuration suggestive of infectious inflammatory etiology or interstitial lung disease.  PET CT showed right upper lobe pulm nodule to be hypermetabolic and consistent with primary lung neoplasm.  No mediastinal or hilar adenopathy was noted.  There were 2 right lower lobe pulmonary nodules demonstrating low-level FDG which are indeterminate but suspicious.  Patient underwent navigational bronchoscopy which was positive for non-small cell lung cancer favoring adenocarcinoma.  She is seen today for consideration of treatment.  Again she has a cough nonproductive no hemoptysis or chest tightness.    PLANNED TREATMENT REGIMEN: SBRT  PAST MEDICAL HISTORY:  has a past medical history of Anxiety, COPD (chronic obstructive pulmonary disease), Depression, GERD (gastroesophageal reflux disease), Hyperlipidemia, Pulmonary nodules/lesions, multiple, and Right femoral fracture.    PAST SURGICAL HISTORY:  Past Surgical History:  Procedure Laterality Date   ABDOMINAL HYSTERECTOMY  1975   COLONOSCOPY     COLOSTOMY  2012   repaired and reduced in 2013    intestinal rupture  2012   INTRAMEDULLARY (IM) NAIL INTERTROCHANTERIC Right 12/17/2021   Procedure: INTRAMEDULLARY (IM) NAIL INTERTROCHANTRIC;  Surgeon: Ross Marcus, MD;  Location: [facility] ORS;  Service: Orthopedics;  Laterality: Right;   TAKE DOWN OF INTESTINAL FISTULA  2013   TUMOR EXCISION  2013   hospitalized and drained.    VIDEO BRONCHOSCOPY WITH ENDOBRONCHIAL ULTRASOUND N/A 03/19/2023   Procedure: VIDEO BRONCHOSCOPY WITH ENDOBRONCHIAL ULTRASOUND;  Surgeon: Vida Rigger, MD;  Location: [facility] ORS;  Service: Thoracic;  Laterality: N/A;    FAMILY HISTORY: family history includes Heart disease in her father and mother.  SOCIAL HISTORY:  reports that she quit smoking about 12 years ago. Her smoking use included cigarettes. She has a 37.00 pack-year smoking history. She has never used smokeless tobacco. She reports that she does not drink alcohol and does not use drugs.  ALLERGIES: Avelox [moxifloxacin hcl], Moxifloxacin, Cefadroxil, and Elemental sulfur  MEDICATIONS:  Current Outpatient Medications  Medication Sig Dispense Refill   albuterol (VENTOLIN HFA) 108 (90 Base) MCG/ACT inhaler Inhale 1-2 puffs into the lungs every 6 (six) hours as needed for wheezing or shortness of breath.     amitriptyline (ELAVIL) 50 MG tablet Take 50 mg by mouth at bedtime.     aspirin 81 MG chewable tablet Chew 81 mg by mouth daily.     Budeson-Glycopyrrol-Formoterol (BREZTRI AEROSPHERE) 160-9-4.8 MCG/ACT AERO Inhale 1 puff into the lungs 2 (two) times daily.     celecoxib (CELEBREX) 200 MG capsule Take 200 mg by mouth 2 (two) times daily.     cholecalciferol (VITAMIN D3) 25 MCG (1000 UNIT) tablet Take 1,000 Units by mouth daily.     CYCLOBENZAPRINE HCL PO Take 1  tablet by mouth as needed.     gabapentin (NEURONTIN) 600 MG tablet Take 1,200 mg by mouth 3 (three) times daily.     HYDROcodone-acetaminophen (NORCO) 10-325 MG tablet Take 1 tablet by mouth 4 (four) times daily as needed for moderate pain or  severe pain.     latanoprost (XALATAN) 0.005 % ophthalmic solution Place 1 drop into both eyes at bedtime.     OXYGEN Inhale 2 L into the lungs continuous.     pantoprazole (PROTONIX) 40 MG tablet Take 40 mg by mouth 2 (two) times daily.     simvastatin (ZOCOR) 40 MG tablet Take 40 mg by mouth every evening.     zolpidem (AMBIEN) 10 MG tablet Take 0.5 tablets (5 mg total) by mouth at bedtime. 30 tablet 0   No current facility-administered medications for this encounter.    ECOG PERFORMANCE STATUS:  0 - Asymptomatic  REVIEW OF SYSTEMS: Patient has a history of COPD, anxiety. Patient denies any weight loss, fatigue, weakness, fever, chills or night sweats. Patient denies any loss of vision, blurred vision. Patient denies any ringing  of the ears or hearing loss. No irregular heartbeat. Patient denies heart murmur or history of fainting. Patient denies any chest pain or pain radiating to her upper extremities. Patient denies any shortness of breath, difficulty breathing at night, cough or hemoptysis. Patient denies any swelling in the lower legs. Patient denies any nausea vomiting, vomiting of blood, or coffee ground material in the vomitus. Patient denies any stomach pain. Patient states has had normal bowel movements no significant constipation or diarrhea. Patient denies any dysuria, hematuria or significant nocturia. Patient denies any problems walking, swelling in the joints or loss of balance. Patient denies any skin changes, loss of hair or loss of weight. Patient denies any excessive worrying or anxiety or significant depression. Patient denies any problems with insomnia. Patient denies excessive thirst, polyuria, polydipsia. Patient denies any swollen glands, patient denies easy bruising or easy bleeding. Patient denies any recent infections, allergies or URI. Patient "s visual fields have not changed significantly in recent time. PHYSICAL EXAM: There were no vitals taken for this  visit. Well-developed well-nourished patient in NAD. HEENT reveals PERLA, EOMI, discs not visualized.  Oral cavity is clear. No oral mucosal lesions are identified. Neck is clear without evidence of cervical or supraclavicular adenopathy. Lungs are clear to A&P. Cardiac examination is essentially unremarkable with regular rate and rhythm without murmur rub or thrill. Abdomen is benign with no organomegaly or masses noted. Motor sensory and DTR levels are equal and symmetric in the upper and lower extremities. Cranial nerves II through XII are grossly intact. Proprioception is intact. No peripheral adenopathy or edema is identified. No motor or sensory levels are noted. Crude visual fields are within normal range.  LABORATORY DATA: Cytology and surgical pathology reports reviewed    RADIOLOGY RESULTS: CT cancer PET CT scan reviewed compatible with above-stated findings   IMPRESSION: Stage I non-small cell lung cancer of the right upper lobe in 82 year old female with significant COPD emphysema  PLAN: At this time I have offered SBRT to her right upper lobe lesion.  I have discussed the case with medical oncology there is some suspicious possible adenopathy in the chest although this was sampled and it was not hypermetabolic on PET/CT.  We have decided to go ahead with SBRT.  Follow-up's imaging will be performed to follow her pulmonary nodules as well as possible mediastinal adenopathy.  Risks and benefits including extremely  low side effect profile from SBRT were discussed with the patient and her daughter both comprehend my treatment plan well.  Will use for dimensional treatment planning during simulation.  I would like to take this opportunity to thank you for allowing me to participate in the care of your patient.Carmina Miller.  Milani Lowenstein, MD

## 2023-03-27 NOTE — Progress Notes (Signed)
Met with patient during initial consult with Dr. Smith Robert and Dr. Rushie Chestnut to discuss recent biopsy results and treatment options. All questions answered during visit. Reviewed upcoming appts. Contact info given. Instructed to call with any questions or needs. Pt and daughter verbalized understanding.

## 2023-03-29 ENCOUNTER — Other Ambulatory Visit: Payer: PPO

## 2023-04-04 ENCOUNTER — Ambulatory Visit
Admission: RE | Admit: 2023-04-04 | Discharge: 2023-04-04 | Disposition: A | Discharge status: Home / Self Care | Payer: PPO | Source: Ambulatory Visit | Attending: Radiation Oncology | Admitting: Radiation Oncology

## 2023-04-04 ENCOUNTER — Encounter: Payer: Self-pay | Admitting: *Deleted

## 2023-04-04 DIAGNOSIS — Z510 Encounter for antineoplastic radiation therapy: Secondary | ICD-10-CM | POA: Insufficient documentation

## 2023-04-04 DIAGNOSIS — C3411 Malignant neoplasm of upper lobe, right bronchus or lung: Secondary | ICD-10-CM | POA: Diagnosis present

## 2023-04-10 DIAGNOSIS — Z510 Encounter for antineoplastic radiation therapy: Secondary | ICD-10-CM | POA: Diagnosis not present

## 2023-04-11 ENCOUNTER — Ambulatory Visit
Admission: RE | Admit: 2023-04-11 | Discharge: 2023-04-11 | Disposition: A | Discharge status: Home / Self Care | Payer: PPO | Source: Ambulatory Visit | Attending: Radiation Oncology | Admitting: Radiation Oncology

## 2023-04-11 ENCOUNTER — Other Ambulatory Visit: Payer: Self-pay

## 2023-04-11 ENCOUNTER — Encounter: Payer: Self-pay | Admitting: *Deleted

## 2023-04-11 DIAGNOSIS — Z510 Encounter for antineoplastic radiation therapy: Secondary | ICD-10-CM | POA: Diagnosis not present

## 2023-04-11 LAB — RAD ONC ARIA SESSION SUMMARY
Course Elapsed Days: 0
Plan Fractions Treated to Date: 1
Plan Prescribed Dose Per Fraction: 12 Gy
Plan Total Fractions Prescribed: 5
Plan Total Prescribed Dose: 60 Gy
Reference Point Dosage Given to Date: 12 Gy
Reference Point Session Dosage Given: 12 Gy
Session Number: 1

## 2023-04-13 ENCOUNTER — Ambulatory Visit
Admission: RE | Admit: 2023-04-13 | Discharge: 2023-04-13 | Disposition: A | Discharge status: Home / Self Care | Payer: PPO | Source: Ambulatory Visit | Attending: Radiation Oncology | Admitting: Radiation Oncology

## 2023-04-13 ENCOUNTER — Other Ambulatory Visit: Payer: Self-pay

## 2023-04-13 DIAGNOSIS — Z510 Encounter for antineoplastic radiation therapy: Secondary | ICD-10-CM | POA: Diagnosis not present

## 2023-04-13 LAB — RAD ONC ARIA SESSION SUMMARY
Course Elapsed Days: 2
Plan Fractions Treated to Date: 2
Plan Prescribed Dose Per Fraction: 12 Gy
Plan Total Fractions Prescribed: 5
Plan Total Prescribed Dose: 60 Gy
Reference Point Dosage Given to Date: 24 Gy
Reference Point Session Dosage Given: 12 Gy
Session Number: 2

## 2023-04-16 ENCOUNTER — Other Ambulatory Visit: Payer: Self-pay

## 2023-04-16 ENCOUNTER — Ambulatory Visit
Admission: RE | Admit: 2023-04-16 | Discharge: 2023-04-16 | Disposition: A | Discharge status: Home / Self Care | Payer: PPO | Source: Ambulatory Visit | Attending: Radiation Oncology | Admitting: Radiation Oncology

## 2023-04-16 DIAGNOSIS — Z510 Encounter for antineoplastic radiation therapy: Secondary | ICD-10-CM | POA: Diagnosis not present

## 2023-04-16 LAB — RAD ONC ARIA SESSION SUMMARY
Course Elapsed Days: 5
Plan Fractions Treated to Date: 3
Plan Prescribed Dose Per Fraction: 12 Gy
Plan Total Fractions Prescribed: 5
Plan Total Prescribed Dose: 60 Gy
Reference Point Dosage Given to Date: 36 Gy
Reference Point Session Dosage Given: 12 Gy
Session Number: 3

## 2023-04-18 ENCOUNTER — Other Ambulatory Visit: Payer: Self-pay

## 2023-04-18 ENCOUNTER — Ambulatory Visit
Admission: RE | Admit: 2023-04-18 | Discharge: 2023-04-18 | Disposition: A | Discharge status: Home / Self Care | Payer: PPO | Source: Ambulatory Visit | Attending: Radiation Oncology | Admitting: Radiation Oncology

## 2023-04-18 DIAGNOSIS — Z510 Encounter for antineoplastic radiation therapy: Secondary | ICD-10-CM | POA: Diagnosis present

## 2023-04-18 DIAGNOSIS — C3411 Malignant neoplasm of upper lobe, right bronchus or lung: Secondary | ICD-10-CM | POA: Diagnosis present

## 2023-04-18 LAB — RAD ONC ARIA SESSION SUMMARY
Course Elapsed Days: 7
Plan Fractions Treated to Date: 4
Plan Prescribed Dose Per Fraction: 12 Gy
Plan Total Fractions Prescribed: 5
Plan Total Prescribed Dose: 60 Gy
Reference Point Dosage Given to Date: 48 Gy
Reference Point Session Dosage Given: 12 Gy
Session Number: 4

## 2023-04-23 ENCOUNTER — Ambulatory Visit
Admission: RE | Admit: 2023-04-23 | Discharge: 2023-04-23 | Disposition: A | Discharge status: Home / Self Care | Payer: PPO | Source: Ambulatory Visit | Attending: Radiation Oncology | Admitting: Radiation Oncology

## 2023-04-23 ENCOUNTER — Other Ambulatory Visit: Payer: Self-pay

## 2023-04-23 DIAGNOSIS — Z510 Encounter for antineoplastic radiation therapy: Secondary | ICD-10-CM | POA: Diagnosis not present

## 2023-04-23 LAB — RAD ONC ARIA SESSION SUMMARY
Course Elapsed Days: 12
Plan Fractions Treated to Date: 5
Plan Prescribed Dose Per Fraction: 12 Gy
Plan Total Fractions Prescribed: 5
Plan Total Prescribed Dose: 60 Gy
Reference Point Dosage Given to Date: 60 Gy
Reference Point Session Dosage Given: 12 Gy
Session Number: 5

## 2023-05-05 LAB — ACID FAST CULTURE WITH REFLEXED SENSITIVITIES (MYCOBACTERIA): Acid Fast Culture: NEGATIVE

## 2023-05-23 ENCOUNTER — Ambulatory Visit
Admission: RE | Admit: 2023-05-23 | Discharge: 2023-05-23 | Disposition: A | Discharge status: Home / Self Care | Payer: PPO | Source: Ambulatory Visit | Attending: Radiation Oncology | Admitting: Radiation Oncology

## 2023-05-23 ENCOUNTER — Encounter: Payer: Self-pay | Admitting: Radiation Oncology

## 2023-05-23 DIAGNOSIS — R918 Other nonspecific abnormal finding of lung field: Secondary | ICD-10-CM

## 2023-05-23 NOTE — Progress Notes (Signed)
Radiation Oncology Follow up Note  Name: Alexandra [name]   Date:   05/23/2023 MRN:  [id number] DOB: [birth year]    This 82 y.o. female presents to the clinic today for 1 month follow-up status post SBRT to right lung nodule biopsy positive for non-small cell lung cancer favoring adenocarcinoma.  REFERRING PROVIDER: [name], MD  HPI: Patient is an 82 year old female now out 1 month having completed SBRT to her right lung for a biopsy-proven non-small cell lung cancer favoring adenocarcinoma.  She states she is been nauseous the past week or 2.  She states her breathing is somewhat more labored saw her PMD and started on antibiotic therapy.  She is oxygen dependent has known significant COPD emphysema.  She specifically [place] hemoptysis or exacerbation of cough..  COMPLICATIONS OF TREATMENT: none  FOLLOW UP COMPLIANCE: keeps appointments   PHYSICAL EXAM:  There were no vitals taken for this visit. Wheelchair-bound female on nasal oxygen in NAD.  Well-developed well-nourished patient in NAD. HEENT reveals PERLA, EOMI, discs not visualized.  Oral cavity is clear. No oral mucosal lesions are identified. Neck is clear without evidence of cervical or supraclavicular adenopathy. Lungs are clear to A&P. Cardiac examination is essentially unremarkable with regular rate and rhythm without murmur rub or thrill. Abdomen is benign with no organomegaly or masses noted. Motor sensory and DTR levels are equal and symmetric in the upper and lower extremities. Cranial nerves II through XII are grossly intact. Proprioception is intact. No peripheral adenopathy or edema is identified. No motor or sensory levels are noted. Crude visual fields are within normal range.  RADIOLOGY RESULTS: CT scans reviewed  PLAN: Present time I have asked the patient make a follow-up appoint with Dr. Deatra Ina for some pulmonary tune up.  She will continue on antibiotic therapy.  I have asked to see her back in 4 months with a follow-up  CT scan show is a CT scan scheduled this month and appoint with Dr. Smith Robert.  Patient is to call with any concerns.  I would like to take this opportunity to thank you for allowing me to participate in the care of your patient.Alexandra Miller, MD

## 2023-05-29 ENCOUNTER — Ambulatory Visit
Admission: RE | Admit: 2023-05-29 | Discharge: 2023-05-29 | Disposition: A | Discharge status: Home / Self Care | Payer: PPO | Source: Ambulatory Visit | Attending: Oncology | Admitting: Oncology

## 2023-05-29 DIAGNOSIS — C3411 Malignant neoplasm of upper lobe, right bronchus or lung: Secondary | ICD-10-CM | POA: Insufficient documentation

## 2023-05-29 MED ORDER — IOHEXOL 300 MG/ML  SOLN
75.0000 mL | Freq: Once | INTRAMUSCULAR | Status: AC | PRN
  Administered 2023-05-29: 75 mL via INTRAVENOUS

## 2023-06-04 ENCOUNTER — Inpatient Hospital Stay: Payer: PPO | Attending: Oncology | Admitting: Oncology

## 2023-06-04 ENCOUNTER — Encounter: Payer: Self-pay | Admitting: Oncology

## 2023-06-04 VITALS — BP 127/66 | HR 93 | Temp 96.8°F | Resp 18 | Ht 69.0 in | Wt 140.3 lb

## 2023-06-04 DIAGNOSIS — Z87891 Personal history of nicotine dependence: Secondary | ICD-10-CM | POA: Diagnosis not present

## 2023-06-04 DIAGNOSIS — C3411 Malignant neoplasm of upper lobe, right bronchus or lung: Secondary | ICD-10-CM | POA: Diagnosis not present

## 2023-06-04 LAB — POCT I-STAT CREATININE: Creatinine, Ser: 1.1 mg/dL — ABNORMAL HIGH (ref 0.44–1.00)

## 2023-06-06 NOTE — Progress Notes (Signed)
Hematology/Oncology Consult note [facility]  Telephone:([phone] [phone]  Patient Care Team: [name], MD as PCP - General (Family Medicine) Glory Buff, RN as Oncology Nurse Navigator   Name of the patient: Alexandra [name]  [id number]  February 25, 1941   Date of visit: 06/06/23  Diagnosis-stage I right upper lobe lung cancer adenocarcinoma  Chief complaint/ Reason for visit-discuss CT scan results and further management  Heme/Onc history: Patient is a 82 year old female with a past medical history is significant for 45-pack-year smoking and who has been on home oxygen 2 L.  She has a history of severe COPD.  She underwent CT chest lung cancer screening protocol in February 2024 which was concerning for a spiculated nodule in the right apex.  This was followed by PET CT scan on 02/14/2023 which showed right upper lobe pulmonary nodule 1.3 cm hypermetabolic with an SUV of 12.67.  No enlarged mediastinal or hilar lymphadenopathy.  No evidence of supraclavicular or axillary adenopathy.  12 mm subsolid right lower lobe lymph node with an FDG of 1.6 and there was another nodular density in the right lower lobe with an SUV of 1.9 which was deemed to be indeterminate.  She had a CT super D chest protocol on 03/13/2023 which showed similar findings.   Patient had bronchoscopy with Dr. Karna Christmas and right upper lobe lung biopsy was consistent with non-small cell carcinoma favoring adenocarcinoma.  Station 4R, 10 R lymph node biopsy was negative for malignancy.  Station 7 lymph node biopsy was suspicious for malignancy with single atypical epithelial group present in the cellblock but not diagnostic.  Bronchial lavage was also positive for Moraxella catarrhalis and patient completed course of antibiotics.  Interval history-patient continues to feel poorly since her episode of pneumonia.  She feels short of breath with minimal exertion.  ECOG PS- 3 Pain scale-  2   Review of systems- Review of Systems  Constitutional:  Positive for malaise/fatigue. Negative for chills, fever and weight loss.  HENT:  Negative for congestion, ear discharge and nosebleeds.   Eyes:  Negative for blurred vision.  Respiratory:  Positive for shortness of breath. Negative for cough, hemoptysis, sputum production and wheezing.   Cardiovascular:  Negative for chest pain, palpitations, orthopnea and claudication.  Gastrointestinal:  Negative for abdominal pain, blood in stool, constipation, diarrhea, heartburn, melena, nausea and vomiting.  Genitourinary:  Negative for dysuria, flank pain, frequency, hematuria and urgency.  Musculoskeletal:  Negative for back pain, joint pain and myalgias.  Skin:  Negative for rash.  Neurological:  Negative for dizziness, tingling, focal weakness, seizures, weakness and headaches.  Endo/Heme/Allergies:  Does not bruise/bleed easily.  Psychiatric/Behavioral:  Negative for depression and suicidal ideas. The patient does not have insomnia.       Allergies  Allergen Reactions   Avelox [Moxifloxacin Hcl] Shortness Of Breath and Swelling   Moxifloxacin Anaphylaxis   Cefadroxil Itching   Elemental Sulfur Other (See Comments)    Reaction as a child and not sure what.      Past Medical History:  Diagnosis Date   Anxiety    COPD (chronic obstructive pulmonary disease) (HCC)    Depression    GERD (gastroesophageal reflux disease)    Hyperlipidemia    Pulmonary nodules/lesions, multiple    Right femoral fracture [facility])      Past Surgical History:  Procedure Laterality Date   ABDOMINAL HYSTERECTOMY  1975   COLONOSCOPY     COLOSTOMY  2012   repaired  and reduced in 2013   intestinal rupture  2012   INTRAMEDULLARY (IM) NAIL INTERTROCHANTERIC Right 12/17/2021   Procedure: INTRAMEDULLARY (IM) NAIL INTERTROCHANTRIC;  Surgeon: Ross Marcus, MD;  Location: [facility] ORS;  Service: Orthopedics;  Laterality: Right;   TAKE DOWN OF INTESTINAL  FISTULA  2013   TUMOR EXCISION  2013   hospitalized and drained.    VIDEO BRONCHOSCOPY WITH ENDOBRONCHIAL ULTRASOUND N/A 03/19/2023   Procedure: VIDEO BRONCHOSCOPY WITH ENDOBRONCHIAL ULTRASOUND;  Surgeon: Vida Rigger, MD;  Location: [facility] ORS;  Service: Thoracic;  Laterality: N/A;    Social History   Socioeconomic History   Marital status: Married    Spouse name: Not on file   Number of children: Not on file   Years of education: Not on file   Highest education level: Not on file  Occupational History   Not on file  Tobacco Use   Smoking status: Former    Packs/day: 1.00    Years: 37.00    Additional pack years: 0.00    Total pack years: 37.00    Types: Cigarettes    Quit date: 2012    Years since quitting: 12.4   Smokeless tobacco: Never   Tobacco comments:    stopped in 2021  Vaping Use   Vaping Use: Never used  Substance and Sexual Activity   Alcohol use: Never   Drug use: Never   Sexual activity: Not Currently  Other Topics Concern   Not on file  Social History Narrative   Not on file   Social Determinants of Health   Financial Resource Strain: Low Risk  (03/27/2023)   Overall Financial Resource Strain (CARDIA)    Difficulty of Paying Living Expenses: Not very hard  Food Insecurity: No Food Insecurity (03/27/2023)   Hunger Vital Sign    Worried About Running Out of Food in the Last Year: Never true    Ran Out of Food in the Last Year: Never true  Transportation Needs: No Transportation Needs (03/27/2023)   PRAPARE - Administrator, Civil Service (Medical): No    Lack of Transportation (Non-Medical): No  Physical Activity: Inactive (03/27/2023)   Exercise Vital Sign    Days of Exercise per Week: 0 days    Minutes of Exercise per Session: 0 min  Stress: No Stress Concern Present (03/27/2023)   [employer] of Occupational Health - Occupational Stress Questionnaire    Feeling of Stress : Only a little  Social Connections: Socially Integrated  (03/27/2023)   Social Connection and Isolation Panel [NHANES]    Frequency of Communication with Friends and Family: More than three times a week    Frequency of Social Gatherings with Friends and Family: Once a week    Attends Religious Services: 1 to 4 times per year    Active Member of [employer] or Organizations: Yes    Attends Banker Meetings: Never    Marital Status: Married  Catering manager Violence: Not At Risk (03/27/2023)   Humiliation, Afraid, Rape, and Kick questionnaire    Fear of Current or Ex-Partner: No    Emotionally Abused: No    Physically Abused: No    Sexually Abused: No    Family History  Problem Relation Age of Onset   Heart disease Mother    Heart disease Father      Current Outpatient Medications:    albuterol (VENTOLIN HFA) 108 (90 Base) MCG/ACT inhaler, Inhale 1-2 puffs into the lungs every 6 (six) hours as needed for wheezing  or shortness of breath., Disp: , Rfl:    amitriptyline (ELAVIL) 50 MG tablet, Take 50 mg by mouth at bedtime., Disp: , Rfl:    aspirin 81 MG chewable tablet, Chew 81 mg by mouth daily., Disp: , Rfl:    Budeson-Glycopyrrol-Formoterol (BREZTRI AEROSPHERE) 160-9-4.8 MCG/ACT AERO, Inhale 1 puff into the lungs 2 (two) times daily., Disp: , Rfl:    celecoxib (CELEBREX) 200 MG capsule, Take 200 mg by mouth 2 (two) times daily., Disp: , Rfl:    cholecalciferol (VITAMIN D3) 25 MCG (1000 UNIT) tablet, Take 1,000 Units by mouth daily., Disp: , Rfl:    CYCLOBENZAPRINE HCL PO, Take 1 tablet by mouth as needed., Disp: , Rfl:    gabapentin (NEURONTIN) 600 MG tablet, Take 1,200 mg by mouth 3 (three) times daily., Disp: , Rfl:    HYDROcodone-acetaminophen (NORCO) 10-325 MG tablet, Take 1 tablet by mouth 4 (four) times daily as needed for moderate pain or severe pain., Disp: , Rfl:    latanoprost (XALATAN) 0.005 % ophthalmic solution, Place 1 drop into both eyes at bedtime., Disp: , Rfl:    OXYGEN, Inhale 2 L into the lungs continuous., Disp: ,  Rfl:    pantoprazole (PROTONIX) 40 MG tablet, Take 40 mg by mouth 2 (two) times daily., Disp: , Rfl:    simvastatin (ZOCOR) 40 MG tablet, Take 40 mg by mouth every evening., Disp: , Rfl:    zolpidem (AMBIEN) 10 MG tablet, Take 0.5 tablets (5 mg total) by mouth at bedtime., Disp: 30 tablet, Rfl: 0  Physical exam:  Vitals:   06/04/23 1027  BP: 127/66  Pulse: 93  Resp: 18  Temp: (!) 96.8 F (36 C)  TempSrc: Tympanic  SpO2: 100%  Weight: 140 lb 4.8 oz (63.6 kg)  Height: 5\' 9"  (1.753 m)   Physical Exam Constitutional:      Comments: She appears chronically ill.  She is sitting in a wheelchair and is on home oxygen  Eyes:     Pupils: Pupils are equal, round, and reactive to light.  Cardiovascular:     Rate and Rhythm: Normal rate and regular rhythm.     Heart sounds: Normal heart sounds.  Pulmonary:     Comments: Effort of breathing increased.  Scattered bilateral rhonchi Abdominal:     General: Bowel sounds are normal.     Palpations: Abdomen is soft.  Musculoskeletal:     Cervical back: Normal range of motion.  Skin:    General: Skin is warm and dry.  Neurological:     Mental Status: She is alert and oriented to person, place, and time.         Latest Ref Rng & Units 05/29/2023    3:46 PM  CMP  Creatinine 0.44 - 1.00 mg/dL 5.78       Latest Ref Rng & Units 03/16/2023   12:00 PM  CBC  WBC 4.0 - 10.5 K/uL 10.2   Hemoglobin 12.0 - 15.0 g/dL 46.9   Hematocrit 62.9 - 46.0 % 33.2   Platelets 150 - 400 K/uL 464     No images are attached to the encounter.  CT Chest W Contrast  Result Date: 06/04/2023 CLINICAL DATA:  Non-small cell lung cancer. Assess treatment response. History of severe COPD. * Tracking Code: BO * EXAM: CT CHEST WITH CONTRAST TECHNIQUE: Multidetector CT imaging of the chest was performed during intravenous contrast administration. RADIATION DOSE REDUCTION: This exam was performed according to the departmental dose-optimization program which includes  automated exposure  control, adjustment of the mA and/or kV according to patient size and/or use of iterative reconstruction technique. CONTRAST:  75mL OMNIPAQUE IOHEXOL 300 MG/ML  SOLN COMPARISON:  Chest CT 03/13/2023 and 01/31/2023.  PET-CT 02/14/2023. FINDINGS: Cardiovascular: No acute vascular findings are identified. There is atherosclerosis of the aorta, great vessels and coronary arteries. The heart size is normal. There is no pericardial effusion. Mediastinum/Nodes: There are several prominent mediastinal and right hilar lymph nodes which appear larger than on comparison studies. These were not hypermetabolic PET-CT. Representative nodes include a high right paratracheal node measuring 10 mm short axis on image 21/2, and right hilar nodes measuring 1.3 cm on image 75/2 and 1.2 cm on image 96/2. No axillary adenopathy. The thyroid gland, trachea and esophagus demonstrate no significant findings. Lungs/Pleura: No pleural effusion or pneumothorax. There is mild centrilobular and paraseptal emphysema with diffuse central airway thickening. The dominant nodule posteriorly in the right upper lobe measures 1.1 x 0.9 cm on image 46/4 (previously 1.3 cm). There are new patchy airspace opacities dependently in both lung bases as well as innumerable new ill-defined peribronchovascular nodules throughout both lungs. The largest components are subpleural in the right upper lobe, measuring 2.0 x 1.3 cm on image 66/4 and 1.7 x 1.6 cm on image 83/4. Upper abdomen: No acute findings are seen in the visualized upper abdomen. There are calcifications along the posterior aspect of the liver which appear unchanged. Musculoskeletal/Chest wall: There is no chest wall mass or suspicious osseous finding. Multilevel spondylosis appears unchanged. IMPRESSION: 1. The dominant right upper lobe pulmonary nodule has mildly decreased in size. 2. However, there are innumerable new ill-defined peribronchovascular nodules throughout both lungs  with new patchy airspace opacities dependently in both lung bases. These findings are nonspecific, but likely inflammatory or infectious. Short-term (3 month) CT follow-up recommended. 3. Increasingly prominent mediastinal and right hilar lymph nodes are nonspecific, although likely reactive to the suspected superimposed pulmonary inflammatory/infectious findings. 4. Obviously, assessment for metastatic disease limited by the suspected superimposed inflammatory/infectious findings. 5. Aortic Atherosclerosis (ICD10-I70.0) and Emphysema (ICD10-J43.9). Electronically Signed   By: Carey Bullocks M.D.   On: 06/04/2023 11:41     Assessment and plan- Patient is a 82 y.o. female with history of severe COPD, stage I right upper lobe adenocarcinoma of the lung s/p SBRT here to discuss CT scan results and further management  I have reviewed CT chest images independently.  Results were not available at the time of my patient visit.  I have called the patient's daughter as per patient's request and explained the findings to her.  The right upper lobe lung lesion which was treated with SBRT appears smaller.  Patient has bilateral nodularities and groundglass opacities Prominent hilar and mediastinal lymph nodes which are inflammatory/infectious.  She was treated with antibiotics a couple of weeks ago for her pneumonia.  She continues to feel poorly.  We will reach out to [facility] as well for continued follow-up with him   I will see her back in 3 months with CT scans prior   Visit Diagnosis 1. Malignant neoplasm of upper lobe of right lung [facility])      Dr. Owens Shark, MD, MPH [facility] [phone] 06/06/2023 11:35 AM

## 2023-08-27 ENCOUNTER — Ambulatory Visit
Admission: RE | Admit: 2023-08-27 | Discharge: 2023-08-27 | Disposition: A | Discharge status: Home / Self Care | Payer: PPO | Source: Ambulatory Visit | Attending: Oncology | Admitting: Oncology

## 2023-08-27 DIAGNOSIS — C3411 Malignant neoplasm of upper lobe, right bronchus or lung: Secondary | ICD-10-CM | POA: Insufficient documentation

## 2023-08-27 MED ORDER — IOHEXOL 300 MG/ML  SOLN
75.0000 mL | Freq: Once | INTRAMUSCULAR | Status: AC | PRN
  Administered 2023-08-27: 75 mL via INTRAVENOUS

## 2023-09-10 ENCOUNTER — Inpatient Hospital Stay: Payer: PPO | Attending: Oncology | Admitting: Oncology

## 2023-09-10 ENCOUNTER — Encounter: Payer: Self-pay | Admitting: Oncology

## 2023-09-10 VITALS — BP 110/86 | HR 103 | Temp 96.4°F | Resp 18 | Ht 69.0 in | Wt 139.5 lb

## 2023-09-10 DIAGNOSIS — C3411 Malignant neoplasm of upper lobe, right bronchus or lung: Secondary | ICD-10-CM | POA: Insufficient documentation

## 2023-09-10 DIAGNOSIS — Z85118 Personal history of other malignant neoplasm of bronchus and lung: Secondary | ICD-10-CM | POA: Diagnosis not present

## 2023-09-10 DIAGNOSIS — Z9981 Dependence on supplemental oxygen: Secondary | ICD-10-CM | POA: Insufficient documentation

## 2023-09-10 DIAGNOSIS — Z87891 Personal history of nicotine dependence: Secondary | ICD-10-CM | POA: Insufficient documentation

## 2023-09-10 DIAGNOSIS — J9611 Chronic respiratory failure with hypoxia: Secondary | ICD-10-CM

## 2023-09-10 DIAGNOSIS — Z08 Encounter for follow-up examination after completed treatment for malignant neoplasm: Secondary | ICD-10-CM | POA: Diagnosis not present

## 2023-09-11 NOTE — Progress Notes (Signed)
Hematology/Oncology Consult note [facility]  [phone] [phone]  Patient Care Team: [name], MD as PCP - General (Family Medicine) Glory Buff, RN as Oncology Nurse Navigator Creig Hines, MD as Consulting Physician (Oncology)   Name of the patient: Alexandra [name]  [id number]  [birth year]   Date of visit: 09/11/23  Diagnosis- stage I right upper lobe lung cancer adenocarcinoma   Chief complaint/ Reason for visit-discuss CT scan results and further management  Heme/Onc history:  Patient is a 82 year old female with a past medical history is significant for 45-pack-year smoking and who has been on home oxygen 2 L.  She has a history of severe COPD.  She underwent CT chest lung cancer screening protocol in February 2024 which was concerning for a spiculated nodule in the right apex.  This was followed by PET CT scan on 02/14/2023 which showed right upper lobe pulmonary nodule 1.3 cm hypermetabolic with an SUV of 12.67.  No enlarged mediastinal or hilar lymphadenopathy.  No evidence of supraclavicular or axillary adenopathy.  12 mm subsolid right lower lobe lymph node with an FDG of 1.6 and there was another nodular density in the right lower lobe with an SUV of 1.9 which was deemed to be indeterminate.  She had a CT super D chest protocol on 03/13/2023 which showed similar findings.   Patient had bronchoscopy with Dr. Karna Christmas and right upper lobe lung biopsy was consistent with non-small cell carcinoma favoring adenocarcinoma.  Station 4R, 10 R lymph node biopsy was negative for malignancy.  Station 7 lymph node biopsy was suspicious for malignancy with single atypical epithelial group present in the cellblock but not diagnostic.  Bronchial lavage was also positive for Moraxella catarrhalis and patient completed course of antibiotics.  Subsequently she has developed chronic hypoxic respiratory failure and is on 2 to 3 L of oxygen at home.   She received SBRT to her right upper lobe lung lesion in April 2024.    Interval history-she has baseline shortness of breath both at rest and on mild exertion.  She continues to be oxygen dependent.  Ongoing fatigue.  No recent hospitalizations or infections.  ECOG PS- 2 Pain scale- 0   Review of systems- Review of Systems  Constitutional:  Positive for malaise/fatigue. Negative for chills, fever and weight loss.  HENT:  Negative for congestion, ear discharge and nosebleeds.   Eyes:  Negative for blurred vision.  Respiratory:  Positive for shortness of breath. Negative for cough, hemoptysis, sputum production and wheezing.   Cardiovascular:  Negative for chest pain, palpitations, orthopnea and claudication.  Gastrointestinal:  Negative for abdominal pain, blood in stool, constipation, diarrhea, heartburn, melena, nausea and vomiting.  Genitourinary:  Negative for dysuria, flank pain, frequency, hematuria and urgency.  Musculoskeletal:  Negative for back pain, joint pain and myalgias.  Skin:  Negative for rash.  Neurological:  Negative for dizziness, tingling, focal weakness, seizures, weakness and headaches.  Endo/Heme/Allergies:  Does not bruise/bleed easily.  Psychiatric/Behavioral:  Negative for depression and suicidal ideas. The patient does not have insomnia.       Allergies  Allergen Reactions   Avelox [Moxifloxacin Hcl] Shortness Of Breath and Swelling   Moxifloxacin Anaphylaxis   Cefadroxil Itching   Elemental Sulfur Other (See Comments)    Reaction as a child and not sure what.      Past Medical History:  Diagnosis Date   Anxiety    COPD (chronic obstructive pulmonary disease) (HCC)  Depression    GERD (gastroesophageal reflux disease)    Hyperlipidemia    Pulmonary nodules/lesions, multiple    Right femoral fracture [facility])      Past Surgical History:  Procedure Laterality Date   ABDOMINAL HYSTERECTOMY  1975   COLONOSCOPY     COLOSTOMY  2012   repaired  and reduced in 2013   intestinal rupture  2012   INTRAMEDULLARY (IM) NAIL INTERTROCHANTERIC Right 12/17/2021   Procedure: INTRAMEDULLARY (IM) NAIL INTERTROCHANTRIC;  Surgeon: Ross Marcus, MD;  Location: [facility] ORS;  Service: Orthopedics;  Laterality: Right;   TAKE DOWN OF INTESTINAL FISTULA  2013   TUMOR EXCISION  2013   hospitalized and drained.    VIDEO BRONCHOSCOPY WITH ENDOBRONCHIAL ULTRASOUND N/A 03/19/2023   Procedure: VIDEO BRONCHOSCOPY WITH ENDOBRONCHIAL ULTRASOUND;  Surgeon: Vida Rigger, MD;  Location: [facility] ORS;  Service: Thoracic;  Laterality: N/A;    Social History   Socioeconomic History   Marital status: Married    Spouse name: Not on file   Number of children: Not on file   Years of education: Not on file   Highest education level: Not on file  Occupational History   Not on file  Tobacco Use   Smoking status: Former    Current packs/day: 0.00    Average packs/day: 1 pack/day for 37.0 years (37.0 ttl pk-yrs)    Types: Cigarettes    Start date: 64    Quit date: 2012    Years since quitting: 12.7   Smokeless tobacco: Never   Tobacco comments:    stopped in 2021  Vaping Use   Vaping status: Never Used  Substance and Sexual Activity   Alcohol use: Never   Drug use: Never   Sexual activity: Not Currently  Other Topics Concern   Not on file  Social History Narrative   Not on file   Social Determinants of Health   Financial Resource Strain: Low Risk  (07/17/2023)   Received from [facility]   Overall Financial Resource Strain (CARDIA)    Difficulty of Paying Living Expenses: Not hard at all  Food Insecurity: No Food Insecurity (07/17/2023)   Received from [facility]   Hunger Vital Sign    Worried About Running Out of Food in the Last Year: Never true    Ran Out of Food in the Last Year: Never true  Transportation Needs: No Transportation Needs (07/17/2023)   Received from [facility] -  Transportation    In the past 12 months, has lack of transportation kept you from medical appointments or from getting medications?: No    Lack of Transportation (Non-Medical): No  Physical Activity: Inactive (03/27/2023)   Exercise Vital Sign    Days of Exercise per Week: 0 days    Minutes of Exercise per Session: 0 min  Stress: No Stress Concern Present (03/27/2023)   [employer] of Occupational Health - Occupational Stress Questionnaire    Feeling of Stress : Only a little  Social Connections: Socially Integrated (03/27/2023)   Social Connection and Isolation Panel [NHANES]    Frequency of Communication with Friends and Family: More than three times a week    Frequency of Social Gatherings with Friends and Family: Once a week    Attends Religious Services: 1 to 4 times per year    Active Member of [employer] or Organizations: Yes    Attends Banker Meetings: Never    Marital Status: Married  Intimate  Partner Violence: Not At Risk (03/27/2023)   Humiliation, Afraid, Rape, and Kick questionnaire    Fear of Current or Ex-Partner: No    Emotionally Abused: No    Physically Abused: No    Sexually Abused: No    Family History  Problem Relation Age of Onset   Heart disease Mother    Heart disease Father      Current Outpatient Medications:    albuterol (VENTOLIN HFA) 108 (90 Base) MCG/ACT inhaler, Inhale 1-2 puffs into the lungs every 6 (six) hours as needed for wheezing or shortness of breath., Disp: , Rfl:    amitriptyline (ELAVIL) 50 MG tablet, Take 50 mg by mouth at bedtime., Disp: , Rfl:    aspirin 81 MG chewable tablet, Chew 81 mg by mouth daily., Disp: , Rfl:    Budeson-Glycopyrrol-Formoterol (BREZTRI AEROSPHERE) 160-9-4.8 MCG/ACT AERO, Inhale 1 puff into the lungs 2 (two) times daily., Disp: , Rfl:    celecoxib (CELEBREX) 200 MG capsule, Take 200 mg by mouth 2 (two) times daily., Disp: , Rfl:    cholecalciferol (VITAMIN D3) 25 MCG (1000 UNIT) tablet, Take 1,000 Units  by mouth daily., Disp: , Rfl:    CYCLOBENZAPRINE HCL PO, Take 1 tablet by mouth as needed., Disp: , Rfl:    gabapentin (NEURONTIN) 600 MG tablet, Take 1,200 mg by mouth 3 (three) times daily., Disp: , Rfl:    latanoprost (XALATAN) 0.005 % ophthalmic solution, Place 1 drop into both eyes at bedtime., Disp: , Rfl:    OXYGEN, Inhale 2 L into the lungs continuous., Disp: , Rfl:    pantoprazole (PROTONIX) 40 MG tablet, Take 40 mg by mouth 2 (two) times daily., Disp: , Rfl:    simvastatin (ZOCOR) 40 MG tablet, Take 40 mg by mouth every evening., Disp: , Rfl:    zolpidem (AMBIEN) 10 MG tablet, Take 0.5 tablets (5 mg total) by mouth at bedtime., Disp: 30 tablet, Rfl: 0   HYDROcodone-acetaminophen (NORCO) 10-325 MG tablet, Take 1 tablet by mouth 4 (four) times daily as needed for moderate pain or severe pain. (Patient not taking: Reported on 09/10/2023), Disp: , Rfl:   Physical exam:  Vitals:   09/10/23 1431  BP: 110/86  Pulse: (!) 103  Resp: 18  Temp: (!) 96.4 F (35.8 C)  TempSrc: Tympanic  SpO2: (!) 89%  Weight: 139 lb 8 oz (63.3 kg)  Height: 5\' 9"  (1.753 m)   Physical Exam Cardiovascular:     Rate and Rhythm: Regular rhythm. Tachycardia present.     Heart sounds: Normal heart sounds.  Pulmonary:     Comments: Breath sounds decreased bilaterally diffusely right greater than left.  Effort of breathing increased Abdominal:     General: Bowel sounds are normal.     Palpations: Abdomen is soft.  Skin:    General: Skin is warm and dry.  Neurological:     Mental Status: She is alert and oriented to person, place, and time.         Latest Ref Rng & Units 05/29/2023    3:46 PM  CMP  Creatinine 0.44 - 1.00 mg/dL 8.46       Latest Ref Rng & Units 03/16/2023   12:00 PM  CBC  WBC 4.0 - 10.5 K/uL 10.2   Hemoglobin 12.0 - 15.0 g/dL 96.2   Hematocrit 95.2 - 46.0 % 33.2   Platelets 150 - 400 K/uL 464     No images are attached to the encounter.  CT Chest W Contrast  Result Date:  09/09/2023 CLINICAL DATA:  Right upper lobe non-small cell lung cancer. Restaging. * Tracking Code: BO * EXAM: CT CHEST WITH CONTRAST TECHNIQUE: Multidetector CT imaging of the chest was performed during intravenous contrast administration. RADIATION DOSE REDUCTION: This exam was performed according to the departmental dose-optimization program which includes automated exposure control, adjustment of the mA and/or kV according to patient size and/or use of iterative reconstruction technique. CONTRAST:  75mL OMNIPAQUE IOHEXOL 300 MG/ML  SOLN COMPARISON:  05/29/2023 chest CT FINDINGS: Cardiovascular: Normal heart size. No significant pericardial effusion/thickening. Left anterior descending and left circumflex coronary atherosclerosis. Great vessels are normal in course and caliber. No central pulmonary emboli. Mediastinum/Nodes: No significant thyroid nodules. Unremarkable esophagus. No axillary adenopathy. Mildly enlarged high right mediastinal paraesophageal 1.0 cm node (series 2/image 23), stable. Mild right hilar adenopathy up to 1.0 cm (series 2/image 72), mildly decreased from 1.3 cm. No new pathologically enlarged mediastinal or hilar nodes. Lungs/Pleura: No pneumothorax. No pleural effusion. Previously described 1.1 x 0.9 cm posterior right upper lobe pulmonary nodule is no longer discretely visualized. New dense masslike consolidation in the anterior apical right upper lobe measuring 4.8 x 2.8 cm (series 4/image 34). Scattered mild cylindrical bronchiectasis throughout both lungs, most prominent in the mid to lower lungs, with associated widespread patchy tree-in-bud opacities and patchy nodular and bandlike foci of peripheral consolidation in the lower lobes, overall decreased since 05/29/2023 chest CT, with near resolution of the patchy indistinct regions of consolidation in the basilar lower lobes bilaterally from the prior chest CT, although with some waxing and waning nodular foci of consolidation. For  example, a 1.1 cm indistinct posterior right lower lobe nodule (series 4/image 91) is new, and a superior segment right lower lobe 0.9 cm nodular focus of consolidation (series 4/image 61) is decreased from 2.1 cm. Mild centrilobular emphysema. Upper abdomen: No acute abnormality. Musculoskeletal: No aggressive appearing focal osseous lesions. Marked thoracic spondylosis. IMPRESSION: 1. The index hypermetabolic posterior right upper lobe pulmonary nodule from 02/14/2023 PET-CT is no longer discretely visualized on this chest CT. 2. Widespread waxing and waning patchy regions of masslike and nodular consolidation, patchy tree-in-bud opacities and mild cylindrical bronchiectasis throughout the right greater than left lungs, overall improved since 05/29/2023 chest CT although with new dense consolidation at the right lung apex and some new nodular foci in the right lower lobe. These findings remain indeterminate, favoring an infectious or inflammatory process such as recurrent aspiration or atypical mycobacterial infection. Continued chest CT follow-up advised. 3. Mild mediastinal and right hilar lymphadenopathy is stable to mildly decreased, indeterminate, favoring reactive etiology. 4. Two-vessel coronary atherosclerosis. 5.  Emphysema (ICD10-J43.9). Electronically Signed   By: Delbert Phenix M.D.   On: 09/09/2023 20:02     Assessment and plan- Patient is a 82 y.o. female with history of chronic hypoxic respiratory failure secondary to severe COPD with history of stage I right upper lobe adenocarcinoma s/p SBRT here to discuss CT scan results and further management  I have reviewed CT chest imagesDependently and discussed findings with the patient which does not show any evidence of disease progression presently.  Radiation changes noted in the right upper lobe without any evidence of recurrence.  Patient has findings of tree-in-bud opacities as well as patchy areas of consolidation especially in her right lobe as  compared to left lobe which appears inflammatory.  She also has underlying severe COPD and therefore it is unlikely that she will be able to get off oxygen.  I plan  to follow-up on these inflammatory changes with a repeat CT chest in 4 months.  Clinically patient does not seem to have any evidence of acute infection and therefore I am not starting her on any antibiotics.  If she were to develop any worsening respiratory symptoms she should get in touch with pulmonary Dr. Meredeth Ide who she sees as an outpatient.  Scans in 4 months and see me thereafter   Visit Diagnosis 1. Encounter for follow-up surveillance of lung cancer   2. Chronic hypoxic respiratory failure (HCC)      Dr. Owens Shark, MD, MPH [facility] at [facility] [phone] 09/11/2023 8:52 AM

## 2023-10-17 ENCOUNTER — Ambulatory Visit
Admission: RE | Admit: 2023-10-17 | Discharge: 2023-10-17 | Disposition: A | Discharge status: Home / Self Care | Payer: PPO | Attending: Family Medicine | Admitting: Family Medicine

## 2023-10-17 ENCOUNTER — Other Ambulatory Visit: Payer: Self-pay | Admitting: Family Medicine

## 2023-10-17 ENCOUNTER — Ambulatory Visit
Admission: RE | Admit: 2023-10-17 | Discharge: 2023-10-17 | Disposition: A | Discharge status: Home / Self Care | Payer: PPO | Source: Ambulatory Visit | Attending: Family Medicine | Admitting: Family Medicine

## 2023-10-17 DIAGNOSIS — R059 Cough, unspecified: Secondary | ICD-10-CM | POA: Insufficient documentation

## 2023-10-27 ENCOUNTER — Emergency Department: Payer: PPO

## 2023-10-27 ENCOUNTER — Inpatient Hospital Stay
Admission: EM | Admit: 2023-10-27 | Discharge: 2023-10-30 | DRG: 871 | Disposition: A | Discharge status: Home Health | Payer: PPO | Attending: Internal Medicine | Admitting: Internal Medicine

## 2023-10-27 ENCOUNTER — Other Ambulatory Visit: Payer: Self-pay

## 2023-10-27 DIAGNOSIS — D630 Anemia in neoplastic disease: Secondary | ICD-10-CM | POA: Diagnosis present

## 2023-10-27 DIAGNOSIS — G4700 Insomnia, unspecified: Secondary | ICD-10-CM | POA: Diagnosis present

## 2023-10-27 DIAGNOSIS — E876 Hypokalemia: Secondary | ICD-10-CM | POA: Diagnosis present

## 2023-10-27 DIAGNOSIS — J189 Pneumonia, unspecified organism: Secondary | ICD-10-CM | POA: Diagnosis not present

## 2023-10-27 DIAGNOSIS — D649 Anemia, unspecified: Secondary | ICD-10-CM

## 2023-10-27 DIAGNOSIS — Z87891 Personal history of nicotine dependence: Secondary | ICD-10-CM

## 2023-10-27 DIAGNOSIS — F32A Depression, unspecified: Secondary | ICD-10-CM | POA: Diagnosis present

## 2023-10-27 DIAGNOSIS — T40601A Poisoning by unspecified narcotics, accidental (unintentional), initial encounter: Principal | ICD-10-CM | POA: Insufficient documentation

## 2023-10-27 DIAGNOSIS — G609 Hereditary and idiopathic neuropathy, unspecified: Secondary | ICD-10-CM | POA: Diagnosis present

## 2023-10-27 DIAGNOSIS — E785 Hyperlipidemia, unspecified: Secondary | ICD-10-CM | POA: Diagnosis present

## 2023-10-27 DIAGNOSIS — F1120 Opioid dependence, uncomplicated: Secondary | ICD-10-CM | POA: Diagnosis present

## 2023-10-27 DIAGNOSIS — A419 Sepsis, unspecified organism: Secondary | ICD-10-CM | POA: Diagnosis present

## 2023-10-27 DIAGNOSIS — Z9981 Dependence on supplemental oxygen: Secondary | ICD-10-CM | POA: Diagnosis not present

## 2023-10-27 DIAGNOSIS — N179 Acute kidney failure, unspecified: Secondary | ICD-10-CM | POA: Diagnosis present

## 2023-10-27 DIAGNOSIS — Z90710 Acquired absence of both cervix and uterus: Secondary | ICD-10-CM | POA: Diagnosis not present

## 2023-10-27 DIAGNOSIS — G8929 Other chronic pain: Secondary | ICD-10-CM

## 2023-10-27 DIAGNOSIS — R6520 Severe sepsis without septic shock: Secondary | ICD-10-CM | POA: Diagnosis present

## 2023-10-27 DIAGNOSIS — G928 Other toxic encephalopathy: Secondary | ICD-10-CM | POA: Diagnosis present

## 2023-10-27 DIAGNOSIS — J9611 Chronic respiratory failure with hypoxia: Secondary | ICD-10-CM | POA: Diagnosis present

## 2023-10-27 DIAGNOSIS — Z8249 Family history of ischemic heart disease and other diseases of the circulatory system: Secondary | ICD-10-CM

## 2023-10-27 DIAGNOSIS — Z79899 Other long term (current) drug therapy: Secondary | ICD-10-CM

## 2023-10-27 DIAGNOSIS — J449 Chronic obstructive pulmonary disease, unspecified: Secondary | ICD-10-CM | POA: Diagnosis present

## 2023-10-27 DIAGNOSIS — G894 Chronic pain syndrome: Secondary | ICD-10-CM | POA: Diagnosis present

## 2023-10-27 DIAGNOSIS — Z888 Allergy status to other drugs, medicaments and biological substances status: Secondary | ICD-10-CM

## 2023-10-27 DIAGNOSIS — C3411 Malignant neoplasm of upper lobe, right bronchus or lung: Secondary | ICD-10-CM | POA: Diagnosis present

## 2023-10-27 DIAGNOSIS — Z881 Allergy status to other antibiotic agents status: Secondary | ICD-10-CM

## 2023-10-27 DIAGNOSIS — F419 Anxiety disorder, unspecified: Secondary | ICD-10-CM | POA: Diagnosis present

## 2023-10-27 DIAGNOSIS — R000 Tachycardia, unspecified: Secondary | ICD-10-CM | POA: Diagnosis not present

## 2023-10-27 DIAGNOSIS — T50915A Adverse effect of multiple unspecified drugs, medicaments and biological substances, initial encounter: Secondary | ICD-10-CM | POA: Diagnosis present

## 2023-10-27 DIAGNOSIS — G9341 Metabolic encephalopathy: Secondary | ICD-10-CM

## 2023-10-27 DIAGNOSIS — M545 Low back pain, unspecified: Secondary | ICD-10-CM | POA: Diagnosis present

## 2023-10-27 DIAGNOSIS — Z9109 Other allergy status, other than to drugs and biological substances: Secondary | ICD-10-CM

## 2023-10-27 DIAGNOSIS — F1190 Opioid use, unspecified, uncomplicated: Secondary | ICD-10-CM

## 2023-10-27 DIAGNOSIS — Z7982 Long term (current) use of aspirin: Secondary | ICD-10-CM

## 2023-10-27 DIAGNOSIS — G629 Polyneuropathy, unspecified: Secondary | ICD-10-CM | POA: Diagnosis present

## 2023-10-27 DIAGNOSIS — K219 Gastro-esophageal reflux disease without esophagitis: Secondary | ICD-10-CM | POA: Diagnosis present

## 2023-10-27 DIAGNOSIS — J690 Pneumonitis due to inhalation of food and vomit: Secondary | ICD-10-CM | POA: Diagnosis present

## 2023-10-27 LAB — URINALYSIS, ROUTINE W REFLEX MICROSCOPIC
Bacteria, UA: NONE SEEN
Bilirubin Urine: NEGATIVE
Glucose, UA: NEGATIVE mg/dL
Hgb urine dipstick: NEGATIVE
Ketones, ur: NEGATIVE mg/dL
Nitrite: NEGATIVE
Protein, ur: NEGATIVE mg/dL
Specific Gravity, Urine: 1.019 (ref 1.005–1.030)
pH: 7 (ref 5.0–8.0)

## 2023-10-27 LAB — URINE DRUG SCREEN, QUALITATIVE (ARMC ONLY)
Amphetamines, Ur Screen: NOT DETECTED
Barbiturates, Ur Screen: NOT DETECTED
Benzodiazepine, Ur Scrn: NOT DETECTED
Cannabinoid 50 Ng, Ur ~~LOC~~: NOT DETECTED
Cocaine Metabolite,Ur ~~LOC~~: NOT DETECTED
MDMA (Ecstasy)Ur Screen: NOT DETECTED
Methadone Scn, Ur: NOT DETECTED
Opiate, Ur Screen: POSITIVE — AB
Phencyclidine (PCP) Ur S: NOT DETECTED
Tricyclic, Ur Screen: POSITIVE — AB

## 2023-10-27 LAB — CBC WITH DIFFERENTIAL/PLATELET
Abs Immature Granulocytes: 0.15 10*3/uL — ABNORMAL HIGH (ref 0.00–0.07)
Basophils Absolute: 0 10*3/uL (ref 0.0–0.1)
Basophils Relative: 0 %
Eosinophils Absolute: 0.4 10*3/uL (ref 0.0–0.5)
Eosinophils Relative: 2 %
HCT: 27 % — ABNORMAL LOW (ref 36.0–46.0)
Hemoglobin: 8.2 g/dL — ABNORMAL LOW (ref 12.0–15.0)
Immature Granulocytes: 1 %
Lymphocytes Relative: 4 %
Lymphs Abs: 0.9 10*3/uL (ref 0.7–4.0)
MCH: 26.8 pg (ref 26.0–34.0)
MCHC: 30.4 g/dL (ref 30.0–36.0)
MCV: 88.2 fL (ref 80.0–100.0)
Monocytes Absolute: 2 10*3/uL — ABNORMAL HIGH (ref 0.1–1.0)
Monocytes Relative: 9 %
Neutro Abs: 18.4 10*3/uL — ABNORMAL HIGH (ref 1.7–7.7)
Neutrophils Relative %: 84 %
Platelets: 366 10*3/uL (ref 150–400)
RBC: 3.06 MIL/uL — ABNORMAL LOW (ref 3.87–5.11)
RDW: 14.6 % (ref 11.5–15.5)
WBC: 21.8 10*3/uL — ABNORMAL HIGH (ref 4.0–10.5)
nRBC: 0 % (ref 0.0–0.2)

## 2023-10-27 LAB — VITAMIN B12: Vitamin B-12: 304 pg/mL (ref 180–914)

## 2023-10-27 LAB — MAGNESIUM: Magnesium: 2.1 mg/dL (ref 1.7–2.4)

## 2023-10-27 LAB — COMPREHENSIVE METABOLIC PANEL
ALT: 11 U/L (ref 0–44)
AST: 13 U/L — ABNORMAL LOW (ref 15–41)
Albumin: 3.4 g/dL — ABNORMAL LOW (ref 3.5–5.0)
Alkaline Phosphatase: 125 U/L (ref 38–126)
Anion gap: 10 (ref 5–15)
BUN: 25 mg/dL — ABNORMAL HIGH (ref 8–23)
CO2: 30 mmol/L (ref 22–32)
Calcium: 8.8 mg/dL — ABNORMAL LOW (ref 8.9–10.3)
Chloride: 96 mmol/L — ABNORMAL LOW (ref 98–111)
Creatinine, Ser: 1.34 mg/dL — ABNORMAL HIGH (ref 0.44–1.00)
GFR, Estimated: 40 mL/min — ABNORMAL LOW (ref >60)
Glucose, Bld: 128 mg/dL — ABNORMAL HIGH (ref 70–99)
Potassium: 4.1 mmol/L (ref 3.5–5.1)
Sodium: 136 mmol/L (ref 135–145)
Total Bilirubin: 0.3 mg/dL (ref <1.2)
Total Protein: 8.2 g/dL — ABNORMAL HIGH (ref 6.5–8.1)

## 2023-10-27 LAB — RETICULOCYTES
Immature Retic Fract: 13.3 % (ref 2.3–15.9)
RBC.: 2.77 MIL/uL — ABNORMAL LOW (ref 3.87–5.11)
Retic Count, Absolute: 24.1 10*3/uL (ref 19.0–186.0)
Retic Ct Pct: 0.9 % (ref 0.4–3.1)

## 2023-10-27 LAB — IRON AND TIBC
Iron: 13 ug/dL — ABNORMAL LOW (ref 28–170)
Saturation Ratios: 5 % — ABNORMAL LOW (ref 10.4–31.8)
TIBC: 255 ug/dL (ref 250–450)
UIBC: 242 ug/dL

## 2023-10-27 LAB — LACTIC ACID, PLASMA
Lactic Acid, Venous: 0.9 mmol/L (ref 0.5–1.9)
Lactic Acid, Venous: 0.6 mmol/L (ref 0.5–1.9)

## 2023-10-27 LAB — MRSA NEXT GEN BY PCR, NASAL: MRSA by PCR Next Gen: NOT DETECTED

## 2023-10-27 LAB — APTT: aPTT: 38 s — ABNORMAL HIGH (ref 24–36)

## 2023-10-27 LAB — PROTIME-INR
INR: 1.3 — ABNORMAL HIGH (ref 0.8–1.2)
Prothrombin Time: 16.1 s — ABNORMAL HIGH (ref 11.4–15.2)

## 2023-10-27 LAB — HEMOGLOBIN: Hemoglobin: 7.4 g/dL — ABNORMAL LOW (ref 12.0–15.0)

## 2023-10-27 LAB — CK: Total CK: 66 U/L (ref 38–234)

## 2023-10-27 LAB — PROCALCITONIN: Procalcitonin: 0.15 ng/mL

## 2023-10-27 LAB — FERRITIN: Ferritin: 49 ng/mL (ref 11–307)

## 2023-10-27 LAB — FOLATE: Folate: 15.7 ng/mL (ref >5.9)

## 2023-10-27 MED ORDER — ZOLPIDEM TARTRATE 5 MG PO TABS: 5.0000 mg | ORAL_TABLET | Freq: Every evening | ORAL | Status: DC | PRN

## 2023-10-27 MED ORDER — HYDROCODONE-ACETAMINOPHEN 10-325 MG PO TABS
1.0000 | ORAL_TABLET | Freq: Four times a day (QID) | ORAL | Status: DC | PRN
  Administered 2023-10-27 – 2023-10-30 (×8): 1 via ORAL
  Filled 2023-10-27 (×8): qty 1

## 2023-10-27 MED ORDER — IOHEXOL 350 MG/ML SOLN
75.0000 mL | Freq: Once | INTRAVENOUS | Status: AC | PRN
  Administered 2023-10-27: 75 mL via INTRAVENOUS

## 2023-10-27 MED ORDER — VANCOMYCIN HCL 750 MG/150ML IV SOLN: 750.0000 mg | INTRAVENOUS | Status: DC

## 2023-10-27 MED ORDER — ONDANSETRON HCL 4 MG PO TABS
4.0000 mg | ORAL_TABLET | Freq: Four times a day (QID) | ORAL | Status: DC | PRN
  Administered 2023-10-29 (×2): 4 mg via ORAL
  Filled 2023-10-27: qty 1

## 2023-10-27 MED ORDER — LACTATED RINGERS IV BOLUS
1000.0000 mL | Freq: Once | INTRAVENOUS | Status: AC
  Administered 2023-10-27: 1000 mL via INTRAVENOUS

## 2023-10-27 MED ORDER — ACETAMINOPHEN 650 MG RE SUPP: 650.0000 mg | Freq: Four times a day (QID) | RECTAL | Status: DC | PRN

## 2023-10-27 MED ORDER — ENOXAPARIN SODIUM 40 MG/0.4ML IJ SOSY
40.0000 mg | PREFILLED_SYRINGE | INTRAMUSCULAR | Status: DC
  Administered 2023-10-27 – 2023-10-30 (×4): 40 mg via SUBCUTANEOUS
  Filled 2023-10-27 (×4): qty 0.4

## 2023-10-27 MED ORDER — OXYCODONE HCL 5 MG PO TABS
10.0000 mg | ORAL_TABLET | Freq: Four times a day (QID) | ORAL | Status: DC | PRN
  Administered 2023-10-28: 10 mg via ORAL
  Filled 2023-10-27: qty 2

## 2023-10-27 MED ORDER — VANCOMYCIN HCL 1500 MG/300ML IV SOLN
1500.0000 mg | Freq: Once | INTRAVENOUS | Status: AC
  Administered 2023-10-27: 1500 mg via INTRAVENOUS
  Filled 2023-10-27: qty 300

## 2023-10-27 MED ORDER — LACTATED RINGERS IV SOLN
150.0000 mL/h | INTRAVENOUS | Status: AC
  Administered 2023-10-27 (×3): 150 mL/h via INTRAVENOUS

## 2023-10-27 MED ORDER — ONDANSETRON HCL 4 MG/2ML IJ SOLN
4.0000 mg | Freq: Four times a day (QID) | INTRAMUSCULAR | Status: DC | PRN
  Administered 2023-10-30: 4 mg via INTRAVENOUS
  Filled 2023-10-27 (×2): qty 2

## 2023-10-27 MED ORDER — NALOXONE HCL 2 MG/2ML IJ SOSY: 0.4000 mg | PREFILLED_SYRINGE | INTRAMUSCULAR | Status: DC | PRN

## 2023-10-27 MED ORDER — PIPERACILLIN-TAZOBACTAM 3.375 G IVPB
3.3750 g | Freq: Once | INTRAVENOUS | Status: AC
  Administered 2023-10-27: 3.375 g via INTRAVENOUS
  Filled 2023-10-27: qty 50

## 2023-10-27 MED ORDER — PIPERACILLIN-TAZOBACTAM 3.375 G IVPB 30 MIN
3.3750 g | Freq: Once | INTRAVENOUS | Status: DC
  Filled 2023-10-27: qty 50

## 2023-10-27 MED ORDER — PIPERACILLIN-TAZOBACTAM 3.375 G IVPB
3.3750 g | Freq: Three times a day (TID) | INTRAVENOUS | Status: DC
  Administered 2023-10-27 – 2023-10-28 (×4): 3.375 g via INTRAVENOUS
  Filled 2023-10-27 (×4): qty 50

## 2023-10-27 MED ORDER — ACETAMINOPHEN 325 MG PO TABS
650.0000 mg | ORAL_TABLET | Freq: Four times a day (QID) | ORAL | Status: DC | PRN
  Administered 2023-10-28: 650 mg via ORAL
  Filled 2023-10-27: qty 2

## 2023-10-27 NOTE — ED Notes (Signed)
Pt up to the bedside commode 

## 2023-10-27 NOTE — ED Notes (Signed)
Pt up to the bedside commode. Small loose stool with urine. Pt back to bed. Call light within reach.

## 2023-10-27 NOTE — Assessment & Plan Note (Signed)
Uncertain acuity Hemoglobin 8.2 down from 10.5 about 7 months prior Anemia panel Serial H&H and transfuse as needed

## 2023-10-27 NOTE — Assessment & Plan Note (Signed)
Will hold amitriptyline

## 2023-10-27 NOTE — ED Notes (Signed)
Pt up to the commode. Pt back to bed. Back on the monitor. Call light within reach.

## 2023-10-27 NOTE — ED Triage Notes (Signed)
Per ems they was called out to the pt's house by her sister's boyfriend and was found very lethargic and on the floor with pinpoint pupils. EMS gave 0.5mg  Narcan after pt signaled with her mouth she took Gabapentin and Norco. Pt did respond to Narcan.

## 2023-10-27 NOTE — ED Notes (Signed)
Pt up to the bedside commode. Small stool with urine

## 2023-10-27 NOTE — Assessment & Plan Note (Signed)
Followed by oncology 

## 2023-10-27 NOTE — ED Provider Notes (Signed)
[facility] Provider Note    Event Date/Time   First MD Initiated Contact with Patient 10/27/23 0110     (approximate)   History   Drug Overdose   HPI  Alexandra [name] is a 82 y.o. female who presents to the ED for evaluation of Drug Overdose   I reviewed pulmonary clinic visit from a week ago.  History of COPD on chronic 2 L.  Chronic low back pain prescribed opiates.  RUL adenocarcinoma.  Patient presents to the ED for evaluation of a fall and possible opiate overdose.  EMS was called out for a fall, patient was reportedly found down within her own home.  Patient has no recollection of falling.  EMS found her with slow respirations of about 4.  Pinpoint pupils.  They provided Narcan with improvement of consciousness and respiration rate.  Here in the ED, she does not know what happened.  Reports diffuse "jumpiness" and reports no recent illnesses.    Physical Exam   Triage Vital Signs: ED Triage Vitals [10/27/23 0107]  Encounter Vitals Group     BP (!) 166/63     Systolic BP Percentile      Diastolic BP Percentile      Pulse Rate (!) 110     Resp 19     Temp 98.1 F (36.7 C)     Temp Source Oral     SpO2 99 %     Weight      Height      Head Circumference      Peak Flow      Pain Score      Pain Loc      Pain Education      Exclude from Growth Chart     Most recent vital signs: Vitals:   10/27/23 0600 10/27/23 0630  BP: 124/88 (!) 142/79  Pulse: 98 97  Resp: (!) 22 19  Temp:    SpO2: 100% 100%    General: Awake, no distress.  Moving all 4 CV:  Good peripheral perfusion.  Resp:  Normal effort.  Abd:  No distention.  MSK:  No deformity noted.  No signs of trauma Neuro:  No focal deficits appreciated. Other:     ED Results / Procedures / Treatments   Labs (all labs ordered are listed, but only abnormal results are displayed) Labs Reviewed  COMPREHENSIVE METABOLIC PANEL - Abnormal; Notable for the following components:       Result Value   Chloride 96 (*)    Glucose, Bld 128 (*)    BUN 25 (*)    Creatinine, Ser 1.34 (*)    Calcium 8.8 (*)    Total Protein 8.2 (*)    Albumin 3.4 (*)    AST 13 (*)    GFR, Estimated 40 (*)    All other components within normal limits  CBC WITH DIFFERENTIAL/PLATELET - Abnormal; Notable for the following components:   WBC 21.8 (*)    RBC 3.06 (*)    Hemoglobin 8.2 (*)    HCT 27.0 (*)    Neutro Abs 18.4 (*)    Monocytes Absolute 2.0 (*)    Abs Immature Granulocytes 0.15 (*)    All other components within normal limits  URINALYSIS, ROUTINE W REFLEX MICROSCOPIC - Abnormal; Notable for the following components:   Color, Urine YELLOW (*)    APPearance HAZY (*)    Leukocytes,Ua SMALL (*)    All other components within normal limits  PROTIME-INR - Abnormal; Notable for the following components:   Prothrombin Time 16.1 (*)    INR 1.3 (*)    All other components within normal limits  APTT - Abnormal; Notable for the following components:   aPTT 38 (*)    All other components within normal limits  URINE DRUG SCREEN, QUALITATIVE ([facility] ONLY) - Abnormal; Notable for the following components:   Tricyclic, Ur Screen POSITIVE (*)    Opiate, Ur Screen POSITIVE (*)    All other components within normal limits  HEMOGLOBIN - Abnormal; Notable for the following components:   Hemoglobin 7.4 (*)    All other components within normal limits  IRON AND TIBC - Abnormal; Notable for the following components:   Iron 13 (*)    Saturation Ratios 5 (*)    All other components within normal limits  RETICULOCYTES - Abnormal; Notable for the following components:   RBC. 2.77 (*)    All other components within normal limits  CULTURE, BLOOD (ROUTINE X 2)  CULTURE, BLOOD (ROUTINE X 2)  MAGNESIUM  CK  LACTIC ACID, PLASMA  LACTIC ACID, PLASMA  PROCALCITONIN  VITAMIN B12  FERRITIN  FOLATE    EKG Sinus tachycardia with a rate of 109 bpm.  Wandering baseline.  Normal axis and intervals.  No  STEMI.  RADIOLOGY 1 view CXR interpreted by me with right basilar infiltrate CT head interpreted by me without evidence of acute intracranial pathology CT cervical spine interpreted by me without evidence of fracture or dislocation  Official radiology report(s): CT Angio Chest PE W and/or Wo Contrast  Result Date: 10/27/2023 CLINICAL DATA:  82 year old female status post unwitnessed fall, found down and unresponsive. Right upper lobe lung cancer diagnosed by PET-CT in February this year. EXAM: CT ANGIOGRAPHY CHEST WITH CONTRAST TECHNIQUE: Multidetector CT imaging of the chest was performed using the standard protocol during bolus administration of intravenous contrast. Multiplanar CT image reconstructions and MIPs were obtained to evaluate the vascular anatomy. RADIATION DOSE REDUCTION: This exam was performed according to the departmental dose-optimization program which includes automated exposure control, adjustment of the mA and/or kV according to patient size and/or use of iterative reconstruction technique. CONTRAST:  75mL OMNIPAQUE IOHEXOL 350 MG/ML SOLN COMPARISON:  CTA chest 12/20/2021. 02/14/2023 PET-CT. Restaging chest CT 08/27/2023. FINDINGS: Cardiovascular: Good contrast bolus timing in the pulmonary arterial tree. No suspicious pulmonary artery filling defect. Calcified aortic atherosclerosis. Calcified coronary artery atherosclerosis. Heart size is stable, within normal limits. No pericardial effusion. Mediastinum/Nodes: Traction on the superior right mediastinum now related to right upper lung abnormality. Chronic but increased right hilar lymph nodes now individually up to 17 mm short axis (13 mm last year and 10 mm in September), appear reactive. No superimposed mediastinal mass. Lungs/Pleura: Centrilobular emphysema. Major airways remain patent. Spiculated posterior right upper lobe abnormal lung nodule which was near the major fissure on February PET-CT is now obscured by patchy and  confluent upper lung opacity. Some of this is masslike and spiculated now (series 5, image 42), but compared to 08/27/2023, has mildly regressed. Chronic scarring and architectural distortion in the right middle and lower lobes also but extensive new/increased tree-in-bud nodular opacity in those lobes since September (series 5, image 107). New consolidation in the right middle lobe. Trace right pleural effusion. Patchy chronic solid and sub solid left upper lobe lung opacity including peripherally on series 5, image 70 is very similar to the February PET. However, there is new patchy peribronchial opacity in the left upper lobe  on series 5, image 79. And similar new opacity in the left lower lobe series 124 Images 124 and 126.  No left pleural effusion. Upper Abdomen: Negative visible mostly noncontrast gallbladder, spleen, pancreas, adrenal glands. Chronic benign appearing calcification of the posterior liver capsule, stable visible liver. No upper abdominal free air or free fluid. Visible bowel is nondilated. Compared to 02/14/2023 PET-CT there is evidence of new bilateral hydronephrosis and hydroureter, partially visible. No acute pararenal stranding identified. Musculoskeletal: Stable visualized osseous structures. Osteopenia with advanced thoracic spine degeneration and occasional interbody ankylosis. Review of the MIP images confirms the above findings. IMPRESSION: 1. Negative for acute pulmonary embolus. 2. Chronic lung disease with Emphysema (ICD10-J43.9) and right upper lobe lung cancer. Acute right lung multilobar patchy and tree-in-bud nodular opacity compatible with acute respiratory infection. And evidence of early associated multilobar left lung bronchopneumonia. Trace right pleural effusion. 3. Progressed and indeterminate but possibly reactive (#2) right hilar lymph nodes. 4. Partially visible bilateral hydronephrosis and hydroureter new since February. Query urinary retention. 5.  Aortic  Atherosclerosis (ICD10-I70.0). Electronically Signed   By: Odessa Fleming M.D.   On: 10/27/2023 04:26   CT Cervical Spine Wo Contrast  Result Date: 10/27/2023 CLINICAL DATA:  82 year old female status post unwitnessed fall, found down and unresponsive. EXAM: CT CERVICAL SPINE WITHOUT CONTRAST TECHNIQUE: Multidetector CT imaging of the cervical spine was performed without intravenous contrast. Multiplanar CT image reconstructions were also generated. RADIATION DOSE REDUCTION: This exam was performed according to the departmental dose-optimization program which includes automated exposure control, adjustment of the mA and/or kV according to patient size and/or use of iterative reconstruction technique. COMPARISON:  Head CT today.  Cervical spine CT 12/16/2021.  Sinuses FINDINGS: Alignment: Reversal of the normal cervical lordosis not significantly changed from 2022. Chronic mild degenerative appearing spondylolisthesis in the cervical spine with associated chronic left side facet arthropathy. Cervicothoracic junction alignment is within normal limits. Stable posterior element alignment. Skull base and vertebrae: Chronic osteopenia. Visualized skull base is intact. No atlanto-occipital dissociation. C1 and C2 appear intact and aligned. No acute osseous abnormality identified. Soft tissues and spinal canal: No prevertebral fluid or swelling. No visible canal hematoma. Partially retropharyngeal course of both carotids. Stable and negative visible noncontrast neck soft tissues. Disc levels: Stable chronic cervical spine degeneration, predominantly left side facet arthropathy with multilevel mild spondylolisthesis. And advanced chronic C5-C6 disc and endplate degeneration. Probably no significant spinal stenosis. Upper chest: Visible upper thoracic levels appear grossly intact. Abnormal right lung apex, probably scarring. CTA chest today reported separately. Other: Bulky torus palatinus, normal variant. IMPRESSION: 1. No  acute traumatic injury identified in the cervical spine. 2. Stable chronic cervical spine degeneration since 2022. Electronically Signed   By: Odessa Fleming M.D.   On: 10/27/2023 04:15   CT HEAD WO CONTRAST ( )  Result Date: 10/27/2023 CLINICAL DATA:  82 year old female status post unwitnessed fall, found down and unresponsive. EXAM: CT HEAD WITHOUT CONTRAST TECHNIQUE: Contiguous axial images were obtained from the base of the skull through the vertex without intravenous contrast. RADIATION DOSE REDUCTION: This exam was performed according to the departmental dose-optimization program which includes automated exposure control, adjustment of the mA and/or kV according to patient size and/or use of iterative reconstruction technique. COMPARISON:  Head CT 12/16/2021. FINDINGS: Brain: Stable cerebral volume. No midline shift, ventriculomegaly, mass effect, evidence of mass lesion, intracranial hemorrhage or evidence of cortically based acute infarction. Patchy chronic basal ganglia vascular calcifications. Gray-white matter differentiation appears stable and within normal limits  for age. Incidental chronic cystic change of the pineal gland. Vascular: Calcified atherosclerosis at the skull base. No suspicious intracranial vascular hyperdensity. Skull: Chronic osteopenia.  No fracture identified. Sinuses/Orbits: Unchanged posterior ethmoid and sphenoid sinus mucosal thickening from 2 years ago. Other Visualized paranasal sinuses and mastoids are stable and well aerated. Other: No acute orbit or scalp soft tissue finding. IMPRESSION: 1. No acute intracranial abnormality or acute traumatic injury identified. 2. Stable and negative for age noncontrast CT appearance of the brain. Electronically Signed   By: Odessa Fleming M.D.   On: 10/27/2023 04:11   DG Chest Portable 1 View  Result Date: 10/27/2023 CLINICAL DATA:  Status post fall. EXAM: PORTABLE CHEST 1 VIEW COMPARISON:  October 17, 2023 FINDINGS: The heart size and  mediastinal contours are within normal limits. There is marked severity calcification of the aortic arch. The lungs are hyperinflated with mild, diffuse, chronic appearing increased interstitial lung markings. Mild atelectasis and/or infiltrate is seen within the right lung base, with mild, stable areas of scarring seen within the right upper lobe. There is a small right pleural effusion. No pneumothorax is identified. Multilevel degenerative changes are present throughout the thoracic spine. IMPRESSION: 1. Mild right basilar atelectasis and/or infiltrate. 2. Mild, stable right upper lobe linear scarring. 3. Small right pleural effusion. Electronically Signed   By: Aram Candela M.D.   On: 10/27/2023 03:59    PROCEDURES and INTERVENTIONS:  .1-3 Lead EKG Interpretation  Performed by: Delton Prairie, MD Authorized by: Delton Prairie, MD     Interpretation: abnormal     ECG rate:  110   ECG rate assessment: tachycardic     Rhythm: sinus tachycardia     Ectopy: none     Conduction: normal   .Critical Care  Performed by: Delton Prairie, MD Authorized by: Delton Prairie, MD   Critical care provider statement:    Critical care time (minutes):  30   Critical care time was exclusive of:  Separately billable procedures and treating other patients   Critical care was necessary to treat or prevent imminent or life-threatening deterioration of the following conditions:  Sepsis   Critical care was time spent personally by me on the following activities:  Development of treatment plan with patient or surrogate, discussions with consultants, evaluation of patient's response to treatment, examination of patient, ordering and review of laboratory studies, ordering and review of radiographic studies, ordering and performing treatments and interventions, pulse oximetry, re-evaluation of patient's condition and review of old charts   Medications  piperacillin-tazobactam (ZOSYN) IVPB 3.375 g (3.375 g Intravenous New  Bag/Given 10/27/23 0445)  lactated ringers infusion (150 mL/hr Intravenous New Bag/Given 10/27/23 0646)  enoxaparin (LOVENOX) injection 40 mg (has no administration in time range)  acetaminophen (TYLENOL) tablet 650 mg (has no administration in time range)    Or  acetaminophen (TYLENOL) suppository 650 mg (has no administration in time range)  ondansetron (ZOFRAN) tablet 4 mg (has no administration in time range)    Or  ondansetron (ZOFRAN) injection 4 mg (has no administration in time range)  naloxone (NARCAN) injection 0.4 mg (has no administration in time range)  piperacillin-tazobactam (ZOSYN) IVPB 3.375 g (has no administration in time range)  vancomycin (VANCOREADY) IVPB 750 mg/150 mL (has no administration in time range)  lactated ringers bolus 1,000 mL (0 mLs Intravenous Stopped 10/27/23 0426)  iohexol (OMNIPAQUE) 350 MG/ML injection 75 mL (75 mLs Intravenous Contrast Given 10/27/23 0208)  vancomycin (VANCOREADY) IVPB 1500 mg/300 mL (0 mg Intravenous Stopped  10/27/23 0709)     IMPRESSION / MDM / ASSESSMENT AND PLAN / ED COURSE  I reviewed the triage vital signs and the nursing notes.  Differential diagnosis includes, but is not limited to,   {Patient presents with symptoms of an acute illness or injury that is potentially life-threatening.  Patient presents after an apparent fall and possible opiate overdose with evidence of sepsis from pneumonia requiring medical admission.  No need for recurrent dosing of Narcan in the ED but did respond to this prehospital.  No hypoxia but tachycardia and leukocytosis is noted.  Mild prerenal azotemia/AKI.  Urine is clear and x-ray questions of pneumonia, CTA chest with evidence of multifocal pneumonia.  Cultures are drawn and she is started on broad-spectrum antibiotics.  Clinical Course as of 10/27/23 4132  Sat Oct 27, 2023  0113 I reviewed PDMP.  Within the past 2 weeks she has had Ambien, Norco 10, gabapentin and alprazolam filled [DS]  0218  Reassessed her daughter at the bedside.  She reports concern for pneumonia.  Apparently had some his antibiotics but does not know what she had.  Has been a lot of coughing, weakness, dyspnea. [DS]  [phone] Consult with medicine who agrees to admit [DS]    Clinical Course User Index [DS] Delton Prairie, MD     FINAL CLINICAL IMPRESSION(S) / ED DIAGNOSES   Final diagnoses:  Opiate overdose, accidental or unintentional, initial encounter (HCC)  Sepsis without acute organ dysfunction, due to unspecified organism [facility])     Rx / DC Orders   ED Discharge Orders     None        Note:  This document was prepared using Dragon voice recognition software and may include unintentional dictation errors.   Delton Prairie, MD 10/27/23 [phone]

## 2023-10-27 NOTE — Progress Notes (Signed)
Pharmacy Antibiotic Note  Alexandra [name] is a 82 y.o. female admitted on 10/27/2023 with sepsis.  Pharmacy has been consulted for Vanc, zosyn dosing.  Plan: Zosyn 3.375 gm IV X 1 given in ED on 11/9 @ 0445. Zosyn 3.375 gm IV Q8H EI ordered to start 11/9 @ 1300.  Vancomycin 1500 mg IV X 1 given in ED on 11/9 @ 0439. Vancomycin 750 mg IV Q24H ordered to start on 11/10 @ 0500.  AUC = 507.3 Vanc trough = 14.2   Height: 5\' 9"  (175.3 cm) Weight: 61.4 kg (135 lb 4.8 oz) IBW/kg (Calculated) : 66.2  Temp (24hrs), Avg:98.1 F (36.7 C), Min:98.1 F (36.7 C), Max:98.1 F (36.7 C)  Recent Labs  Lab 10/27/23 0122 10/27/23 0323  WBC 21.8*  --   CREATININE 1.34*  --   LATICACIDVEN  --  0.9    Estimated Creatinine Clearance: 32.5 mL/min (A) (by C-G formula based on SCr of 1.34 mg/dL (H)).    Allergies  Allergen Reactions   Avelox [Moxifloxacin Hcl] Shortness Of Breath and Swelling   Moxifloxacin Anaphylaxis   Cefadroxil Itching   Elemental Sulfur Other (See Comments)    Reaction as a child and not sure what.     Antimicrobials this admission:   >>    >>   Dose adjustments this admission:   Microbiology results:  BCx:   UCx:    Sputum:    MRSA PCR:   Thank you for allowing pharmacy to be a part of this patient's care.  Derrall Hicks D 10/27/2023 6:14 AM

## 2023-10-27 NOTE — Progress Notes (Signed)
  Progress Note   Patient: Alexandra [name] XBJ:[id number] DOB: 06/06/1941 DOA: 10/27/2023     0 DOS: the patient was seen and examined on 10/27/2023   This is a nonbillable encounter. Patient seen and examined with nursing staff at bedside this morning Admitted earlier today for sepsis due to pneumonia. Mental status improved.  Patient looking forward to signing out AGAINST MEDICAL ADVICE as she has to go home to take care of her sick husband. For other detailed assessment and plan please refer to H&P documentation by Dr. Para March this morning.   Vitals:   10/27/23 0801 10/27/23 0905 10/27/23 1100 10/27/23 1400  BP:   129/60 (!) 144/70  Pulse:  97 92 91  Resp:  16 18 18   Temp: 99.4 F (37.4 C)     TempSrc: Oral     SpO2:  98% 100% 100%  Weight:      Height:           Author: Loyce Dys, MD 10/27/2023 3:37 PM  For on call review [email or web address].

## 2023-10-27 NOTE — Assessment & Plan Note (Addendum)
Acute toxic-metabolic encephalopathy Chronic continuous use of opiates for chronic pain Polypharmacy Patient found by EMS with pinpoint pupils and respiratory rate of 3-4 responding to Narcan 0.5 mg Neurologic checks with repeat Narcan dosing if needed Follow UDS Hold sedating meds-Ambien, Norco, gabapentin and alprazolam, amitriptyline Continue supplemental oxygen

## 2023-10-27 NOTE — ED Notes (Signed)
Pt resting comfortably. IVF infusing. Placed on bedpan.

## 2023-10-27 NOTE — Assessment & Plan Note (Addendum)
Suspecting aspiration pneumonia related to acute toxic metabolic encephalopathy Sepsis criteria includes tachycardia and tachypnea, leukocytosis.  Lactic acid was 0.9 and Pro-Cal 0.15 Continue Zosyn and vancomycin started in the ED Continue sepsis fluids SLP consult to evaluate for dysphagia Aspiration precautions-keep n.p.o. tonight Incentive spirometer Supplemental oxygen if needed

## 2023-10-27 NOTE — Evaluation (Signed)
Clinical/Bedside Swallow Evaluation Patient Details  Name: Alexandra [name] MRN: [id number] Date of Birth: 12-08-1941  Today's Date: 10/27/2023 Time: SLP Start Time (ACUTE ONLY): 0830 SLP Stop Time (ACUTE ONLY): 0842 SLP Time Calculation (min) (ACUTE ONLY): 12 min  Past Medical History:  Past Medical History:  Diagnosis Date   Anxiety    COPD (chronic obstructive pulmonary disease) (HCC)    Depression    GERD (gastroesophageal reflux disease)    Hyperlipidemia    Pulmonary nodules/lesions, multiple    Right femoral fracture (HCC)    Past Surgical History:  Past Surgical History:  Procedure Laterality Date   ABDOMINAL HYSTERECTOMY  1975   COLONOSCOPY     COLOSTOMY  2012   repaired and reduced in 2013   intestinal rupture  2012   INTRAMEDULLARY (IM) NAIL INTERTROCHANTERIC Right 12/17/2021   Procedure: INTRAMEDULLARY (IM) NAIL INTERTROCHANTRIC;  Surgeon: Ross Marcus, MD;  Location: [facility] ORS;  Service: Orthopedics;  Laterality: Right;   TAKE DOWN OF INTESTINAL FISTULA  2013   TUMOR EXCISION  2013   hospitalized and drained.    VIDEO BRONCHOSCOPY WITH ENDOBRONCHIAL ULTRASOUND N/A 03/19/2023   Procedure: VIDEO BRONCHOSCOPY WITH ENDOBRONCHIAL ULTRASOUND;  Surgeon: Vida Rigger, MD;  Location: [facility] ORS;  Service: Thoracic;  Laterality: N/A;   HPI:  Pt is a 82 y.o. female who presented via EMS after she was found lethargic on the floor and with pinpoint pupils and respiratory rate of 4, responding to Narcan 0.4mg  by EMS. with medical history significant for COPD on home O2 at 2 L, bronchogenic carcinoma, chronic pain syndrome, insomnia, depression and anxiety on multiple sedating meds (Ambien, Norco, gabapentin, amitriptyline and alprazolam), dyslipidemia, insomnia and neuropathy who is being admitted with sepsis secondary to pneumonia. CT chest, "1. Negative for acute pulmonary embolus.  2. Chronic lung disease with Emphysema (ICD10-J43.9) and right upper  lobe lung cancer. Acute right  lung multilobar patchy and tree-in-bud  nodular opacity compatible with acute respiratory infection. And  evidence of early associated multilobar left lung bronchopneumonia.  Trace right pleural effusion.  3. Progressed and indeterminate but possibly reactive (#2) right  hilar lymph nodes.  4. Partially visible bilateral hydronephrosis and hydroureter new  since February. Query urinary retention.  5.  Aortic Atherosclerosis (ICD10-I70.0)."    Assessment / Plan / Recommendation  Clinical Impression  Pt seen for clniical swallowing evaluation. Pt demonstrated a grossly functional oral swallow c/b mildly prolonged, but functional, mastication of solids. Pharyngeal swallow appeared [facility] per clinical assessment. Recommend continuation of a regular diet with thin liquids with safe swallowing strategies/aspiration precautions as outlined below. SLP to sign off as pt has no acute SLP needs. SLP Visit Diagnosis: Dysphagia, unspecified (R13.10)    Aspiration Risk  Mild aspiration risk    Diet Recommendation Regular;Thin liquid    Liquid Administration via: Spoon;Cup;Straw Medication Administration: Whole meds with liquid (as tolerated) Supervision: Patient able to self feed Compensations: Slow rate;Small sips/bites Postural Changes: Seated upright at 90 degrees;Remain upright for at least 30 minutes after po intake    Other  Recommendations Oral Care Recommendations: Oral care BID;Patient independent with oral care    Recommendations for follow up therapy are one component of a multi-disciplinary discharge planning process, led by the attending physician.  Recommendations may be updated based on patient status, additional functional criteria and insurance authorization.  Follow up Recommendations No SLP follow up         Functional Status Assessment Patient has not had a recent decline in their  functional status         Prognosis Prognosis for improved oropharyngeal function: Good       Swallow Study   General Date of Onset: 10/27/23 HPI: Pt is a 82 y.o. female who presented via EMS after she was found lethargic on the floor and with pinpoint pupils and respiratory rate of 4, responding to Narcan 0.4mg  by EMS. with medical history significant for COPD on home O2 at 2 L, bronchogenic carcinoma, chronic pain syndrome, insomnia, depression and anxiety on multiple sedating meds (Ambien, Norco, gabapentin, amitriptyline and alprazolam), dyslipidemia, insomnia and neuropathy who is being admitted with sepsis secondary to pneumonia. CT chest, "1. Negative for acute pulmonary embolus.  2. Chronic lung disease with Emphysema (ICD10-J43.9) and right upper  lobe lung cancer. Acute right lung multilobar patchy and tree-in-bud  nodular opacity compatible with acute respiratory infection. And  evidence of early associated multilobar left lung bronchopneumonia.  Trace right pleural effusion.  3. Progressed and indeterminate but possibly reactive (#2) right  hilar lymph nodes.  4. Partially visible bilateral hydronephrosis and hydroureter new  since February. Query urinary retention.  5.  Aortic Atherosclerosis (ICD10-I70.0)." Type of Study: Bedside Swallow Evaluation Diet Prior to this Study: Regular;Thin liquids (Level 0) Temperature Spikes Noted: Yes Respiratory Status: Nasal cannula History of Recent Intubation: No Behavior/Cognition: Alert;Cooperative;Pleasant mood Oral Cavity Assessment: Within Functional Limits Oral Care Completed by SLP: No Oral Cavity - Dentition: Missing dentition (has upper and lower partials; not present) Vision: Functional for self-feeding Self-Feeding Abilities: Able to feed self Patient Positioning: Upright in bed Baseline Vocal Quality: Normal Volitional Cough: Strong Volitional Swallow: Able to elicit    Oral/Motor/Sensory Function Overall Oral Motor/Sensory Function: Within functional limits   Ice Chips Ice chips: Not tested   Thin Liquid Thin Liquid:  Within functional limits Presentation: Straw    Nectar Thick Nectar Thick Liquid: Not tested   Honey Thick Honey Thick Liquid: Not tested   Puree Puree: Within functional limits Presentation: Self Fed   Solid     Solid: Within functional limits Presentation: Self Fed Other Comments: mildly prolonged, but functional, mastication     Clyde Canterbury, M.S., CCC-SLP Speech-Language Pathologist [facility] [phone] ([facility])  Alexandra [name] 10/27/2023,8:53 AM

## 2023-10-27 NOTE — Assessment & Plan Note (Signed)
Chronic respiratory failure with hypoxia Not acutely exacerbated Continue home inhalers with DuoNebs as needed Continue supplemental oxygen.  O2 requirement currently at baseline

## 2023-10-27 NOTE — ED Notes (Signed)
Pt used bedpan and also walked to urinate on toilet. Pt providing peri care to self. Steady gait.

## 2023-10-27 NOTE — H&P (Addendum)
History and Physical    Patient: Alexandra [name] WUJ:[id number] DOB: 10/24/1941 DOA: 10/27/2023 DOS: the patient was seen and examined on 10/27/2023 PCP: [name], MD  Patient coming from: Home  Chief Complaint:  Chief Complaint  Patient presents with   Drug Overdose    HPI: Alexandra [name] is a 82 y.o. female with medical history significant for COPD on home O2 at 2 L, bronchogenic carcinoma, chronic pain syndrome, insomnia, depression and anxiety on multiple sedating meds (Ambien, Norco, gabapentin, amitriptyline and alprazolam), dyslipidemia, insomnia and neuropathy who is being admitted with sepsis secondary to pneumonia.  She was brought in by EMS after she was found lethargic on the floor and with pinpoint pupils and respiratory rate of 4, responding to Narcan 0.4mg  by EMS. Patient very somnolent on my assessment but able to awake sufficiently to answer questions.  Denies taking more medication than prescribed.  Denies starting any new medication recently. ED course and data review: BP 166/63, tachycardic to 110, afebrile, O2 sat 99% on room air. Labs: WBC 21,000 with lactic acid 0.9 and procalcitonin 0.15.Hemoglobin 8.2 down from 10.5 about 7 months prior CMP notable for creatinine of 1.34 up from baseline of about 0.9 CK 66 Potassium 4.1 and magnesium 2.1 UDS and UA not done. EKG personally viewed and interpreted showing sinus tachycardia at 109 with no acute ST-T wave changes. Imaging: CT head and C-spine without trauma CTA PE protocol negative for PE with possibility of pneumonia as follows: IMPRESSION: 1. Negative for acute pulmonary embolus. 2. Chronic lung disease with Emphysema (ICD10-J43.9) and right upper lobe lung cancer. Acute right lung multilobar patchy and tree-in-bud nodular opacity compatible with acute respiratory infection. And evidence of early associated multilobar left lung bronchopneumonia. Trace right pleural effusion. 3. Progressed and indeterminate but  possibly reactive (#2) right hilar lymph nodes. 4. Partially visible bilateral hydronephrosis and hydroureter new since February. Query urinary retention.  Patient started on Zosyn and vancomycin and given an LR bolus Hospitalist consulted for admission.     Past Medical History:  Diagnosis Date   Anxiety    COPD (chronic obstructive pulmonary disease) (HCC)    Depression    GERD (gastroesophageal reflux disease)    Hyperlipidemia    Pulmonary nodules/lesions, multiple    Right femoral fracture [facility])    Past Surgical History:  Procedure Laterality Date   ABDOMINAL HYSTERECTOMY  1975   COLONOSCOPY     COLOSTOMY  2012   repaired and reduced in 2013   intestinal rupture  2012   INTRAMEDULLARY (IM) NAIL INTERTROCHANTERIC Right 12/17/2021   Procedure: INTRAMEDULLARY (IM) NAIL INTERTROCHANTRIC;  Surgeon: Ross Marcus, MD;  Location: [facility] ORS;  Service: Orthopedics;  Laterality: Right;   TAKE DOWN OF INTESTINAL FISTULA  2013   TUMOR EXCISION  2013   hospitalized and drained.    VIDEO BRONCHOSCOPY WITH ENDOBRONCHIAL ULTRASOUND N/A 03/19/2023   Procedure: VIDEO BRONCHOSCOPY WITH ENDOBRONCHIAL ULTRASOUND;  Surgeon: Vida Rigger, MD;  Location: [facility] ORS;  Service: Thoracic;  Laterality: N/A;   Social History:  reports that she quit smoking about 12 years ago. Her smoking use included cigarettes. She started smoking about 49 years ago. She has a 37 pack-year smoking history. She has never used smokeless tobacco. She reports that she does not drink alcohol and does not use drugs.  Allergies  Allergen Reactions   Avelox [Moxifloxacin Hcl] Shortness Of Breath and Swelling   Moxifloxacin Anaphylaxis   Cefadroxil Itching   Elemental Sulfur Other (See Comments)  Reaction as a child and not sure what.     Family History  Problem Relation Age of Onset   Heart disease Mother    Heart disease Father     Prior to Admission medications   Medication Sig Start Date End Date Taking?  Authorizing Provider  albuterol (VENTOLIN HFA) 108 (90 Base) MCG/ACT inhaler Inhale 1-2 puffs into the lungs every 6 (six) hours as needed for wheezing or shortness of breath.    [provider]  amitriptyline (ELAVIL) 50 MG tablet Take 50 mg by mouth at bedtime.    [provider]  aspirin 81 MG chewable tablet Chew 81 mg by mouth daily.    [provider]  Budeson-Glycopyrrol-Formoterol (BREZTRI AEROSPHERE) 160-9-4.8 MCG/ACT AERO Inhale 1 puff into the lungs 2 (two) times daily.    [provider]  celecoxib (CELEBREX) 200 MG capsule Take 200 mg by mouth 2 (two) times daily.    [provider]  cholecalciferol (VITAMIN D3) 25 MCG (1000 UNIT) tablet Take 1,000 Units by mouth daily.    [provider]  CYCLOBENZAPRINE HCL PO Take 1 tablet by mouth as needed.    [provider]  gabapentin (NEURONTIN) 600 MG tablet Take 1,200 mg by mouth 3 (three) times daily.    [provider]  HYDROcodone-acetaminophen (NORCO) 10-325 MG tablet Take 1 tablet by mouth 4 (four) times daily as needed for moderate pain or severe pain. Patient not taking: Reported on 09/10/2023    [provider]  latanoprost (XALATAN) 0.005 % ophthalmic solution Place 1 drop into both eyes at bedtime.    [provider]  OXYGEN Inhale 2 L into the lungs continuous.    [provider]  pantoprazole (PROTONIX) 40 MG tablet Take 40 mg by mouth 2 (two) times daily.    [provider]  simvastatin (ZOCOR) 40 MG tablet Take 40 mg by mouth every evening.    [provider]  zolpidem (AMBIEN) 10 MG tablet Take 0.5 tablets (5 mg total) by mouth at bedtime. 12/24/21   Coralie Keens, MD    Physical Exam: Vitals:   10/27/23 0107 10/27/23 0230 10/27/23 0300 10/27/23 0417  BP: (!) 166/63 107/72 (!) 145/68   Pulse: (!) 110 (!) 105 (!) 107   Resp: 19  18   Temp: 98.1 F (36.7 C)     TempSrc: Oral     SpO2: 99% 98% 100%    Weight:    61.4 kg  Height:    5\' 9"  (1.753 m)   Physical Exam Vitals and nursing note reviewed.  Constitutional:      General: She is not in acute distress.    Comments: Somnolent. WIll awake to answer questions but readily falls back asleep.  HENT:     Head: Normocephalic and atraumatic.  Cardiovascular:     Rate and Rhythm: Regular rhythm. Tachycardia present.     Heart sounds: Normal heart sounds.  Pulmonary:     Effort: Pulmonary effort is normal.     Breath sounds: Normal breath sounds.  Abdominal:     Palpations: Abdomen is soft.     Tenderness: There is no abdominal tenderness.  Neurological:     Mental Status: She is lethargic.     Labs on Admission: I have personally reviewed following labs and imaging studies  CBC: Recent Labs  Lab 10/27/23 0122  WBC 21.8*  NEUTROABS 18.4*  HGB 8.2*  HCT 27.0*  MCV 88.2  PLT 366  Basic Metabolic Panel: Recent Labs  Lab 10/27/23 0122  NA 136  K 4.1  CL 96*  CO2 30  GLUCOSE 128*  BUN 25*  CREATININE 1.34*  CALCIUM 8.8*  MG 2.1   GFR: Estimated Creatinine Clearance: 32.5 mL/min (A) (by C-G formula based on SCr of 1.34 mg/dL (H)). Liver Function Tests: Recent Labs  Lab 10/27/23 0122  AST 13*  ALT 11  ALKPHOS 125  BILITOT 0.3  PROT 8.2*  ALBUMIN 3.4*   No results for input(s): "LIPASE", "AMYLASE" in the last 168 hours. No results for input(s): "AMMONIA" in the last 168 hours. Coagulation Profile: Recent Labs  Lab 10/27/23 0323  INR 1.3*   Cardiac Enzymes: Recent Labs  Lab 10/27/23 0122  CKTOTAL 66   BNP (last 3 results) No results for input(s): "PROBNP" in the last 8760 hours. HbA1C: No results for input(s): "HGBA1C" in the last 72 hours. CBG: No results for input(s): "GLUCAP" in the last 168 hours. Lipid Profile: No results for input(s): "CHOL", "HDL", "LDLCALC", "TRIG", "CHOLHDL", "LDLDIRECT" in the last 72 hours. Thyroid Function Tests: No results for input(s): "TSH", "T4TOTAL",  "FREET4", "T3FREE", "THYROIDAB" in the last 72 hours. Anemia Panel: No results for input(s): "VITAMINB12", "FOLATE", "FERRITIN", "TIBC", "IRON", "RETICCTPCT" in the last 72 hours. Urine analysis:    Component Value Date/Time   COLORURINE YELLOW (A) 10/27/2023 0304   APPEARANCEUR HAZY (A) 10/27/2023 0304   APPEARANCEUR Clear 01/20/2012 1203   LABSPEC 1.019 10/27/2023 0304   LABSPEC 1.004 01/20/2012 1203   PHURINE 7.0 10/27/2023 0304   GLUCOSEU NEGATIVE 10/27/2023 0304   GLUCOSEU Negative 01/20/2012 1203   HGBUR NEGATIVE 10/27/2023 0304   BILIRUBINUR NEGATIVE 10/27/2023 0304   BILIRUBINUR Negative 01/20/2012 1203   KETONESUR NEGATIVE 10/27/2023 0304   PROTEINUR NEGATIVE 10/27/2023 0304   NITRITE NEGATIVE 10/27/2023 0304   LEUKOCYTESUR SMALL (A) 10/27/2023 0304   LEUKOCYTESUR Negative 01/20/2012 1203    Radiological Exams on Admission: CT Angio Chest PE W and/or Wo Contrast  Result Date: 10/27/2023 CLINICAL DATA:  82 year old female status post unwitnessed fall, found down and unresponsive. Right upper lobe lung cancer diagnosed by PET-CT in February this year. EXAM: CT ANGIOGRAPHY CHEST WITH CONTRAST TECHNIQUE: Multidetector CT imaging of the chest was performed using the standard protocol during bolus administration of intravenous contrast. Multiplanar CT image reconstructions and MIPs were obtained to evaluate the vascular anatomy. RADIATION DOSE REDUCTION: This exam was performed according to the departmental dose-optimization program which includes automated exposure control, adjustment of the mA and/or kV according to patient size and/or use of iterative reconstruction technique. CONTRAST:  75mL OMNIPAQUE IOHEXOL 350 MG/ML SOLN COMPARISON:  CTA chest 12/20/2021. 02/14/2023 PET-CT. Restaging chest CT 08/27/2023. FINDINGS: Cardiovascular: Good contrast bolus timing in the pulmonary arterial tree. No suspicious pulmonary artery filling defect. Calcified aortic atherosclerosis. Calcified  coronary artery atherosclerosis. Heart size is stable, within normal limits. No pericardial effusion. Mediastinum/Nodes: Traction on the superior right mediastinum now related to right upper lung abnormality. Chronic but increased right hilar lymph nodes now individually up to 17 mm short axis (13 mm last year and 10 mm in September), appear reactive. No superimposed mediastinal mass. Lungs/Pleura: Centrilobular emphysema. Major airways remain patent. Spiculated posterior right upper lobe abnormal lung nodule which was near the major fissure on February PET-CT is now obscured by patchy and confluent upper lung opacity. Some of this is masslike and spiculated now (series 5, image 42), but compared to 08/27/2023, has mildly regressed. Chronic scarring and architectural distortion in the  right middle and lower lobes also but extensive new/increased tree-in-bud nodular opacity in those lobes since September (series 5, image 107). New consolidation in the right middle lobe. Trace right pleural effusion. Patchy chronic solid and sub solid left upper lobe lung opacity including peripherally on series 5, image 70 is very similar to the February PET. However, there is new patchy peribronchial opacity in the left upper lobe on series 5, image 79. And similar new opacity in the left lower lobe series 124 Images 124 and 126.  No left pleural effusion. Upper Abdomen: Negative visible mostly noncontrast gallbladder, spleen, pancreas, adrenal glands. Chronic benign appearing calcification of the posterior liver capsule, stable visible liver. No upper abdominal free air or free fluid. Visible bowel is nondilated. Compared to 02/14/2023 PET-CT there is evidence of new bilateral hydronephrosis and hydroureter, partially visible. No acute pararenal stranding identified. Musculoskeletal: Stable visualized osseous structures. Osteopenia with advanced thoracic spine degeneration and occasional interbody ankylosis. Review of the MIP  images confirms the above findings. IMPRESSION: 1. Negative for acute pulmonary embolus. 2. Chronic lung disease with Emphysema (ICD10-J43.9) and right upper lobe lung cancer. Acute right lung multilobar patchy and tree-in-bud nodular opacity compatible with acute respiratory infection. And evidence of early associated multilobar left lung bronchopneumonia. Trace right pleural effusion. 3. Progressed and indeterminate but possibly reactive (#2) right hilar lymph nodes. 4. Partially visible bilateral hydronephrosis and hydroureter new since February. Query urinary retention. 5.  Aortic Atherosclerosis (ICD10-I70.0). Electronically Signed   By: Odessa Fleming M.D.   On: 10/27/2023 04:26   CT Cervical Spine Wo Contrast  Result Date: 10/27/2023 CLINICAL DATA:  82 year old female status post unwitnessed fall, found down and unresponsive. EXAM: CT CERVICAL SPINE WITHOUT CONTRAST TECHNIQUE: Multidetector CT imaging of the cervical spine was performed without intravenous contrast. Multiplanar CT image reconstructions were also generated. RADIATION DOSE REDUCTION: This exam was performed according to the departmental dose-optimization program which includes automated exposure control, adjustment of the mA and/or kV according to patient size and/or use of iterative reconstruction technique. COMPARISON:  Head CT today.  Cervical spine CT 12/16/2021.  Sinuses FINDINGS: Alignment: Reversal of the normal cervical lordosis not significantly changed from 2022. Chronic mild degenerative appearing spondylolisthesis in the cervical spine with associated chronic left side facet arthropathy. Cervicothoracic junction alignment is within normal limits. Stable posterior element alignment. Skull base and vertebrae: Chronic osteopenia. Visualized skull base is intact. No atlanto-occipital dissociation. C1 and C2 appear intact and aligned. No acute osseous abnormality identified. Soft tissues and spinal canal: No prevertebral fluid or swelling.  No visible canal hematoma. Partially retropharyngeal course of both carotids. Stable and negative visible noncontrast neck soft tissues. Disc levels: Stable chronic cervical spine degeneration, predominantly left side facet arthropathy with multilevel mild spondylolisthesis. And advanced chronic C5-C6 disc and endplate degeneration. Probably no significant spinal stenosis. Upper chest: Visible upper thoracic levels appear grossly intact. Abnormal right lung apex, probably scarring. CTA chest today reported separately. Other: Bulky torus palatinus, normal variant. IMPRESSION: 1. No acute traumatic injury identified in the cervical spine. 2. Stable chronic cervical spine degeneration since 2022. Electronically Signed   By: Odessa Fleming M.D.   On: 10/27/2023 04:15   CT HEAD WO CONTRAST ( )  Result Date: 10/27/2023 CLINICAL DATA:  82 year old female status post unwitnessed fall, found down and unresponsive. EXAM: CT HEAD WITHOUT CONTRAST TECHNIQUE: Contiguous axial images were obtained from the base of the skull through the vertex without intravenous contrast. RADIATION DOSE REDUCTION: This exam was performed according to  the departmental dose-optimization program which includes automated exposure control, adjustment of the mA and/or kV according to patient size and/or use of iterative reconstruction technique. COMPARISON:  Head CT 12/16/2021. FINDINGS: Brain: Stable cerebral volume. No midline shift, ventriculomegaly, mass effect, evidence of mass lesion, intracranial hemorrhage or evidence of cortically based acute infarction. Patchy chronic basal ganglia vascular calcifications. Gray-white matter differentiation appears stable and within normal limits for age. Incidental chronic cystic change of the pineal gland. Vascular: Calcified atherosclerosis at the skull base. No suspicious intracranial vascular hyperdensity. Skull: Chronic osteopenia.  No fracture identified. Sinuses/Orbits: Unchanged posterior ethmoid and  sphenoid sinus mucosal thickening from 2 years ago. Other Visualized paranasal sinuses and mastoids are stable and well aerated. Other: No acute orbit or scalp soft tissue finding. IMPRESSION: 1. No acute intracranial abnormality or acute traumatic injury identified. 2. Stable and negative for age noncontrast CT appearance of the brain. Electronically Signed   By: Odessa Fleming M.D.   On: 10/27/2023 04:11   DG Chest Portable 1 View  Result Date: 10/27/2023 CLINICAL DATA:  Status post fall. EXAM: PORTABLE CHEST 1 VIEW COMPARISON:  October 17, 2023 FINDINGS: The heart size and mediastinal contours are within normal limits. There is marked severity calcification of the aortic arch. The lungs are hyperinflated with mild, diffuse, chronic appearing increased interstitial lung markings. Mild atelectasis and/or infiltrate is seen within the right lung base, with mild, stable areas of scarring seen within the right upper lobe. There is a small right pleural effusion. No pneumothorax is identified. Multilevel degenerative changes are present throughout the thoracic spine. IMPRESSION: 1. Mild right basilar atelectasis and/or infiltrate. 2. Mild, stable right upper lobe linear scarring. 3. Small right pleural effusion. Electronically Signed   By: Aram Candela M.D.   On: 10/27/2023 03:59     Data Reviewed: Relevant notes from primary care and specialist visits, past discharge summaries as available in EHR, including Care Everywhere. Prior diagnostic testing as pertinent to current admission diagnoses Updated medications and problem lists for reconciliation ED course, including vitals, labs, imaging, treatment and response to treatment Triage notes, nursing and pharmacy notes and ED provider's notes Notable results as noted in HPI   Assessment and Plan: Sepsis due to pneumonia [facility]) Suspecting aspiration pneumonia related to acute toxic metabolic encephalopathy Sepsis criteria includes tachycardia and  tachypnea, leukocytosis.  Lactic acid was 0.9 and Pro-Cal 0.15 Continue Zosyn and vancomycin started in the ED Continue sepsis fluids SLP consult to evaluate for dysphagia Aspiration precautions-keep n.p.o. tonight Incentive spirometer Supplemental oxygen if needed  Anemia Uncertain acuity Hemoglobin 8.2 down from 10.5 about 7 months prior Anemia panel Serial H&H and transfuse as needed  Overdose of opiate or related narcotic, accidental or unintentional, initial encounter (HCC) Acute toxic-metabolic encephalopathy Chronic continuous use of opiates for chronic pain Polypharmacy Patient found by EMS with pinpoint pupils and respiratory rate of 3-4 responding to Narcan 0.5 mg Neurologic checks with repeat Narcan dosing if needed Follow UDS Hold sedating meds-Ambien, Norco, gabapentin and alprazolam, amitriptyline Continue supplemental oxygen  Chronic obstructive pulmonary disease (COPD) (HCC) Chronic respiratory failure with hypoxia Not acutely exacerbated Continue home inhalers with DuoNebs as needed Continue supplemental oxygen.  O2 requirement currently at baseline  Malignant neoplasm of upper lobe of right lung (HCC) Followed by oncology  Anxiety and depression Will hold amitriptyline    DVT prophylaxis: Lovenox  Consults: none  Advance Care Planning:   Code Status: Prior   Family Communication: none  Disposition Plan: Back to previous  home environment  Severity of Illness: The appropriate patient status for this patient is INPATIENT. Inpatient status is judged to be reasonable and necessary in order to provide the required intensity of service to ensure the patient's safety. The patient's presenting symptoms, physical exam findings, and initial radiographic and laboratory data in the context of their chronic comorbidities is felt to place them at high risk for further clinical deterioration. Furthermore, it is not anticipated that the patient will be medically  stable for discharge from the hospital within 2 midnights of admission.   * I certify that at the point of admission it is my clinical judgment that the patient will require inpatient hospital care spanning beyond 2 midnights from the point of admission due to high intensity of service, high risk for further deterioration and high frequency of surveillance required.*  Author: Andris Baumann, MD 10/27/2023 5:09 AM  For on call review [email or web address].

## 2023-10-28 DIAGNOSIS — T40601A Poisoning by unspecified narcotics, accidental (unintentional), initial encounter: Secondary | ICD-10-CM | POA: Diagnosis not present

## 2023-10-28 DIAGNOSIS — A419 Sepsis, unspecified organism: Secondary | ICD-10-CM | POA: Diagnosis not present

## 2023-10-28 LAB — BASIC METABOLIC PANEL
Anion gap: 6 (ref 5–15)
BUN: 13 mg/dL (ref 8–23)
CO2: 30 mmol/L (ref 22–32)
Calcium: 8.1 mg/dL — ABNORMAL LOW (ref 8.9–10.3)
Chloride: 99 mmol/L (ref 98–111)
Creatinine, Ser: 0.9 mg/dL (ref 0.44–1.00)
GFR, Estimated: >60 mL/min (ref >60)
Glucose, Bld: 100 mg/dL — ABNORMAL HIGH (ref 70–99)
Potassium: 3.7 mmol/L (ref 3.5–5.1)
Sodium: 135 mmol/L (ref 135–145)

## 2023-10-28 LAB — CBC WITH DIFFERENTIAL/PLATELET
Abs Immature Granulocytes: 0.08 10*3/uL — ABNORMAL HIGH (ref 0.00–0.07)
Basophils Absolute: 0 10*3/uL (ref 0.0–0.1)
Basophils Relative: 0 %
Eosinophils Absolute: 0.2 10*3/uL (ref 0.0–0.5)
Eosinophils Relative: 1 %
HCT: 24.7 % — ABNORMAL LOW (ref 36.0–46.0)
Hemoglobin: 7.5 g/dL — ABNORMAL LOW (ref 12.0–15.0)
Immature Granulocytes: 1 %
Lymphocytes Relative: 3 %
Lymphs Abs: 0.5 10*3/uL — ABNORMAL LOW (ref 0.7–4.0)
MCH: 26.7 pg (ref 26.0–34.0)
MCHC: 30.4 g/dL (ref 30.0–36.0)
MCV: 87.9 fL (ref 80.0–100.0)
Monocytes Absolute: 1.2 10*3/uL — ABNORMAL HIGH (ref 0.1–1.0)
Monocytes Relative: 7 %
Neutro Abs: 13.6 10*3/uL — ABNORMAL HIGH (ref 1.7–7.7)
Neutrophils Relative %: 88 %
Platelets: 293 10*3/uL (ref 150–400)
RBC: 2.81 MIL/uL — ABNORMAL LOW (ref 3.87–5.11)
RDW: 14.4 % (ref 11.5–15.5)
WBC: 15.6 10*3/uL — ABNORMAL HIGH (ref 4.0–10.5)
nRBC: 0 % (ref 0.0–0.2)

## 2023-10-28 MED ORDER — PANTOPRAZOLE SODIUM 40 MG PO TBEC
40.0000 mg | DELAYED_RELEASE_TABLET | Freq: Two times a day (BID) | ORAL | Status: DC
  Administered 2023-10-28 – 2023-10-30 (×4): 40 mg via ORAL
  Filled 2023-10-28 (×4): qty 1

## 2023-10-28 MED ORDER — ALBUTEROL SULFATE (2.5 MG/3ML) 0.083% IN NEBU
2.5000 mg | INHALATION_SOLUTION | Freq: Four times a day (QID) | RESPIRATORY_TRACT | Status: DC | PRN
  Administered 2023-10-29: 2.5 mg via RESPIRATORY_TRACT
  Filled 2023-10-28: qty 3

## 2023-10-28 MED ORDER — PIPERACILLIN-TAZOBACTAM 3.375 G IVPB
3.3750 g | Freq: Three times a day (TID) | INTRAVENOUS | Status: DC
  Administered 2023-10-28 – 2023-10-30 (×5): 3.375 g via INTRAVENOUS
  Filled 2023-10-28 (×6): qty 50

## 2023-10-28 MED ORDER — GABAPENTIN 300 MG PO CAPS
300.0000 mg | ORAL_CAPSULE | Freq: Three times a day (TID) | ORAL | Status: DC
  Administered 2023-10-28 – 2023-10-30 (×6): 300 mg via ORAL
  Filled 2023-10-28 (×7): qty 1

## 2023-10-28 MED ORDER — FLUTICASONE FUROATE-VILANTEROL 200-25 MCG/ACT IN AEPB
1.0000 | INHALATION_SPRAY | Freq: Every day | RESPIRATORY_TRACT | Status: DC
  Administered 2023-10-29 – 2023-10-30 (×2): 1 via RESPIRATORY_TRACT
  Filled 2023-10-28 (×2): qty 28

## 2023-10-28 MED ORDER — SIMVASTATIN 20 MG PO TABS
40.0000 mg | ORAL_TABLET | Freq: Every evening | ORAL | Status: DC
  Administered 2023-10-28 – 2023-10-29 (×2): 40 mg via ORAL
  Filled 2023-10-28 (×2): qty 2

## 2023-10-28 MED ORDER — ZOLPIDEM TARTRATE 5 MG PO TABS
5.0000 mg | ORAL_TABLET | Freq: Every day | ORAL | Status: DC
  Administered 2023-10-29: 5 mg via ORAL
  Filled 2023-10-28 (×2): qty 1

## 2023-10-28 MED ORDER — LATANOPROST 0.005 % OP SOLN
1.0000 [drp] | Freq: Every day | OPHTHALMIC | Status: DC
  Administered 2023-10-28 – 2023-10-29 (×2): 1 [drp] via OPHTHALMIC
  Filled 2023-10-28 (×3): qty 2.5

## 2023-10-28 MED ORDER — UMECLIDINIUM BROMIDE 62.5 MCG/ACT IN AEPB
1.0000 | INHALATION_SPRAY | Freq: Every day | RESPIRATORY_TRACT | Status: DC
  Administered 2023-10-29 – 2023-10-30 (×2): 1 via RESPIRATORY_TRACT
  Filled 2023-10-28 (×2): qty 7

## 2023-10-28 MED ORDER — CELECOXIB 200 MG PO CAPS
200.0000 mg | ORAL_CAPSULE | Freq: Two times a day (BID) | ORAL | Status: DC
  Administered 2023-10-28 – 2023-10-30 (×5): 200 mg via ORAL
  Filled 2023-10-28 (×6): qty 1

## 2023-10-28 NOTE — Progress Notes (Signed)
Progress Note   Patient: Alexandra [name]:[id number] DOB: 01/02/1941 DOA: 10/27/2023     1 DOS: the patient was seen and examined on 10/28/2023   Brief hospital course: Alexandra [name] is a 82 y.o. female with medical history significant for COPD on home O2 at 2 L, bronchogenic carcinoma, chronic pain syndrome, insomnia, depression and anxiety on multiple sedating meds (Ambien, Norco, gabapentin, amitriptyline and alprazolam), dyslipidemia, insomnia and neuropathy who is being admitted with sepsis secondary to pneumonia.    Assessment and Plan: Sepsis due to pneumonia [facility]) Suspecting aspiration pneumonia related to acute toxic metabolic encephalopathy Sepsis criteria includes tachycardia and tachypnea, leukocytosis.  Lactic acid was 0.9 and Pro-Cal 0.15 Continue current antibiotics Continue current oxygen requirement to maintain appropriate saturation   Anemia of chronic disease No indication for transfusion at this time Continue to monitor CBC closely   Overdose of opiate or related narcotic, accidental or unintentional, initial encounter (HCC) Acute toxic-metabolic encephalopathy Chronic continuous use of opiates for chronic pain Polypharmacy Neurologic checks with repeat Narcan dosing if needed Urine drug screen is positive for TCA as well as opioid We will avoid sedating medication as much as possible Patient requested for her gabapentin for which a lower dose has been ordered   Chronic obstructive pulmonary disease (COPD) (HCC) Chronic respiratory failure with hypoxia Not acutely exacerbated Continue home inhalers with DuoNebs as needed Continue supplemental oxygen.  O2 requirement currently at baseline   Malignant neoplasm of upper lobe of right lung (HCC) Followed by oncology   Anxiety and depression Will hold amitriptyline    DVT prophylaxis: Lovenox   Consults: none   Advance Care Planning:   Code Status: Prior    Family Communication: Discussed with patient's  daughter over the phone   Disposition Plan: Back to previous home environment    Subjective:  Patient seen and examined at bedside this morning Denies nausea vomiting abdominal pain Requiring 3 L of intranasal oxygen  Physical Exam: General: Elderly female on 3 L of oxygen HENT:     Head: Normocephalic and atraumatic.  Cardiovascular:     Rate and Rhythm: Regular rhythm. Tachycardia present.     Heart sounds: Normal heart sounds.  Pulmonary:     Effort: Pulmonary effort is normal.     Breath sounds: Normal breath sounds.  Abdominal:     Palpations: Abdomen is soft.     Tenderness: There is no abdominal tenderness.  Neurological:     Mental Status: She is lethargic.   Vitals:   10/27/23 2100 10/27/23 2130 10/27/23 2200 10/28/23 0333  BP: (!) 167/69 (!) 155/67 (!) 167/91 (!) 146/70  Pulse: 100 96 (!) 125 91  Resp:   (!) 22 20  Temp:    98.2 F (36.8 C)  TempSrc:    Oral  SpO2: 98% 97% (!) 89% 97%  Weight:      Height:        Data Reviewed: I have reviewed patient's CT scan of the chest showing findings of extensive emphysema with some infiltrate concerning for pneumonia I have also reviewed patient's lab report below    Latest Ref Rng & Units 10/28/2023    4:28 AM 10/27/2023    1:22 AM 05/29/2023    3:46 PM  BMP  Glucose 70 - 99 mg/dL 440  102    BUN 8 - 23 mg/dL 13  25    Creatinine 7.25 - 1.00 mg/dL 3.66  4.40  3.47   Sodium 135 - 145 mmol/L  135  136    Potassium 3.5 - 5.1 mmol/L 3.7  4.1    Chloride 98 - 111 mmol/L 99  96    CO2 22 - 32 mmol/L 30  30    Calcium 8.9 - 10.3 mg/dL 8.1  8.8         Latest Ref Rng & Units 10/28/2023    4:28 AM 10/27/2023    6:37 AM 10/27/2023    1:22 AM  CBC  WBC 4.0 - 10.5 K/uL 15.6   21.8   Hemoglobin 12.0 - 15.0 g/dL 7.5  7.4  8.2   Hematocrit 36.0 - 46.0 % 24.7   27.0   Platelets 150 - 400 K/uL 293   366       Time spent: 57 minutes  Author: Loyce Dys, MD 10/28/2023 4:08 PM  For on call review  [email or web address].

## 2023-10-29 DIAGNOSIS — A419 Sepsis, unspecified organism: Secondary | ICD-10-CM | POA: Diagnosis not present

## 2023-10-29 DIAGNOSIS — T40601A Poisoning by unspecified narcotics, accidental (unintentional), initial encounter: Secondary | ICD-10-CM | POA: Diagnosis not present

## 2023-10-29 DIAGNOSIS — R000 Tachycardia, unspecified: Secondary | ICD-10-CM

## 2023-10-29 LAB — BASIC METABOLIC PANEL
Anion gap: 13 (ref 5–15)
BUN: 18 mg/dL (ref 8–23)
CO2: 28 mmol/L (ref 22–32)
Calcium: 9 mg/dL (ref 8.9–10.3)
Chloride: 95 mmol/L — ABNORMAL LOW (ref 98–111)
Creatinine, Ser: 0.84 mg/dL (ref 0.44–1.00)
GFR, Estimated: >60 mL/min (ref >60)
Glucose, Bld: 70 mg/dL (ref 70–99)
Potassium: 3.3 mmol/L — ABNORMAL LOW (ref 3.5–5.1)
Sodium: 136 mmol/L (ref 135–145)

## 2023-10-29 LAB — CBC WITH DIFFERENTIAL/PLATELET
Abs Immature Granulocytes: 0.07 10*3/uL (ref 0.00–0.07)
Basophils Absolute: 0.1 10*3/uL (ref 0.0–0.1)
Basophils Relative: 0 %
Eosinophils Absolute: 0.2 10*3/uL (ref 0.0–0.5)
Eosinophils Relative: 2 %
HCT: 31.1 % — ABNORMAL LOW (ref 36.0–46.0)
Hemoglobin: 9.8 g/dL — ABNORMAL LOW (ref 12.0–15.0)
Immature Granulocytes: 1 %
Lymphocytes Relative: 5 %
Lymphs Abs: 0.8 10*3/uL (ref 0.7–4.0)
MCH: 26.8 pg (ref 26.0–34.0)
MCHC: 31.5 g/dL (ref 30.0–36.0)
MCV: 85.2 fL (ref 80.0–100.0)
Monocytes Absolute: 0.8 10*3/uL (ref 0.1–1.0)
Monocytes Relative: 5 %
Neutro Abs: 13 10*3/uL — ABNORMAL HIGH (ref 1.7–7.7)
Neutrophils Relative %: 87 %
Platelets: 481 10*3/uL — ABNORMAL HIGH (ref 150–400)
RBC: 3.65 MIL/uL — ABNORMAL LOW (ref 3.87–5.11)
RDW: 14.1 % (ref 11.5–15.5)
WBC: 14.9 10*3/uL — ABNORMAL HIGH (ref 4.0–10.5)
nRBC: 0 % (ref 0.0–0.2)

## 2023-10-29 MED ORDER — POTASSIUM CHLORIDE CRYS ER 20 MEQ PO TBCR
40.0000 meq | EXTENDED_RELEASE_TABLET | ORAL | Status: AC
  Administered 2023-10-29 (×2): 40 meq via ORAL
  Filled 2023-10-29 (×2): qty 2

## 2023-10-29 MED ORDER — GERHARDT'S BUTT CREAM
TOPICAL_CREAM | Freq: Four times a day (QID) | CUTANEOUS | Status: DC
  Filled 2023-10-29: qty 1

## 2023-10-29 NOTE — Plan of Care (Signed)
°  Problem: Respiratory: °Goal: Ability to maintain adequate ventilation will improve °Outcome: Progressing °  °

## 2023-10-29 NOTE — Progress Notes (Signed)
Progress Note   Patient: Alexandra [name] ZOX:[id number] DOB: Apr 10, 1941 DOA: 10/27/2023     2 DOS: the patient was seen and examined on 10/29/2023   Brief hospital course: MELENIE TORIO is a 82 y.o. female with medical history significant for COPD on home O2 at 2 L, bronchogenic carcinoma, chronic pain syndrome, insomnia, depression and anxiety on multiple sedating meds (Ambien, Norco, gabapentin, amitriptyline and alprazolam), dyslipidemia, insomnia and neuropathy who is being admitted with sepsis secondary to pneumonia.     Assessment and Plan: Sepsis due to pneumonia [facility]) Suspecting aspiration pneumonia related to acute toxic metabolic encephalopathy Sepsis criteria includes tachycardia and tachypnea, leukocytosis.  Lactic acid was 0.9 and Pro-Cal 0.15 Continue current antibiotics Continue current oxygen requirement to maintain appropriate saturation   Anemia of chronic disease No indication for transfusion at this time Monitor CBC closely   Overdose of opiate or related narcotic, accidental or unintentional, initial encounter (HCC) Acute toxic-metabolic encephalopathy Chronic continuous use of opiates for chronic pain Polypharmacy Neurologic checks with repeat Narcan dosing if needed Urine drug screen is positive for TCA as well as opioid Continue to avoid sedating medication as much as possible Patient requested for her gabapentin for which a lower dose has been ordered   Chronic obstructive pulmonary disease (COPD) (HCC) Chronic respiratory failure with hypoxia Not acutely exacerbated Continue home inhalers with DuoNebs as needed Continue supplemental oxygen.  O2 requirement currently at baseline   Malignant neoplasm of upper lobe of right lung (HCC) Followed by oncology   Anxiety and depression Will hold amitriptyline    DVT prophylaxis: Lovenox   Consults: none   Advance Care Planning:   Code Status: Prior    Family Communication: Discussed with patient's daughter  over the phone   Disposition Plan: Back to previous home environment       Subjective:  Patient seen and examined at bedside this morning Have been able to walk around with PT today Being planned for discharge home with home health hopefully tomorrow Denies nausea vomiting or abdominal pain  Physical Exam: General: Elderly female on 3 L of oxygen HENT:     Head: Normocephalic and atraumatic.  Cardiovascular:     Rate and Rhythm: Regular rhythm.  Tachycardia improved    Heart sounds: Normal heart sounds.  Pulmonary:     Effort: Pulmonary effort is normal.     Breath sounds: Normal breath sounds.  Abdominal:     Palpations: Abdomen is soft.     Tenderness: There is no abdominal tenderness.  Neurological: Alert and oriented    Data Reviewed:    Latest Ref Rng & Units 10/29/2023    6:22 AM 10/28/2023    4:28 AM 10/27/2023    6:37 AM  CBC  WBC 4.0 - 10.5 K/uL 14.9  15.6    Hemoglobin 12.0 - 15.0 g/dL 9.8  7.5  7.4   Hematocrit 36.0 - 46.0 % 31.1  24.7    Platelets 150 - 400 K/uL 481  293         Latest Ref Rng & Units 10/29/2023    6:22 AM 10/28/2023    4:28 AM 10/27/2023    1:22 AM  BMP  Glucose 70 - 99 mg/dL 70  811  914   BUN 8 - 23 mg/dL 18  13  25    Creatinine 0.44 - 1.00 mg/dL 7.82  9.56  2.13   Sodium 135 - 145 mmol/L 136  135  136   Potassium 3.5 - 5.1  mmol/L 3.3  3.7  4.1   Chloride 98 - 111 mmol/L 95  99  96   CO2 22 - 32 mmol/L 28  30  30    Calcium 8.9 - 10.3 mg/dL 9.0  8.1  8.8      Vitals:   10/29/23 0328 10/29/23 0752 10/29/23 1201 10/29/23 1516  BP: (!) 180/83 (!) 142/84 117/67 (!) 124/91  Pulse: 90 75 99 92  Resp: 20 18 18 18   Temp: 97.7 F (36.5 C) 98.6 F (37 C) 98.4 F (36.9 C) 98.1 F (36.7 C)  TempSrc: Oral  Oral   SpO2: 99% 99% 98% 99%  Weight:      Height:        Author: Loyce Dys, MD 10/29/2023 5:02 PM  For on call review [email or web address].

## 2023-10-29 NOTE — Progress Notes (Signed)
Transition of Care [facility]) - Inpatient Brief Assessment   Patient Details  Name: Alexandra [name] MRN: [id number] Date of Birth: July 28, 1941  Transition of [facility]) CM/SW Contact:    Darolyn Rua, LCSW Phone Number: 10/29/2023, 9:42 AM   Clinical Narrative:  Patient from home, found lethargic on floor, currently being treated for sepsis secondary to PNA.   PCP: Dr. Joen Laura Insurance: HTA  Please consult TOC should discharge planning needs arise.     Transition of Care Asessment: Insurance and Status: Insurance coverage has been reviewed Patient has primary care physician: Yes Home environment has been reviewed: from home Prior level of function:: independent Prior/Current Home Services: No current home services Social Determinants of Health Reivew: SDOH reviewed no interventions necessary Readmission risk has been reviewed: Yes Transition of care needs: no transition of care needs at this time

## 2023-10-29 NOTE — Evaluation (Signed)
Physical Therapy Evaluation Patient Details Name: Alexandra [name] MRN: [id number] DOB: 06-25-1941 Today's Date: 10/29/2023  History of Present Illness  presented to ER afger being found down on floor, lethargic with pinpoint pupils, AMS; admitted for management of acute toxic metabolic encephalopathy secondary to unintentional overdose of opiates/narcotics.  Clinical Impression  Patient resting in bed upon arrival to room; alert and oriented to basic information, pleasant and cooperative. Mildly HOH.  Denies pain; does endorse some simple mobility (to/from [facility]) during admission.  Bilat UE/LE strength and ROM grossly symmetrical and WFL; no focal weakness appreciated.  Able to complete bed mobility with mod indep; sit/stand, basic transfers and gait (200') with RW, cga.  Demonstrates partially reciprocal stepping pattern with fair step height/length; mildly antalgic on R (history of hip fracture/repair); decreased cadence, decresaed balance reactions. Does require continued use of RW for gait at this time. Frequent cuing for walker position and path negotiation. Mild SOB with gait efforts; sats >93% on 2L throughout distance Would benefit from skilled PT to address above deficits and promote optimal return to PLOF.; recommend post-acute PT follow up as indicated by interdisciplinary care team.             If plan is discharge home, recommend the following: A little help with walking and/or transfers;A little help with bathing/dressing/bathroom   Can travel by private vehicle        Equipment Recommendations Rolling walker (2 wheels)  Recommendations for Other Services       Functional Status Assessment Patient has had a recent decline in their functional status and demonstrates the ability to make significant improvements in function in a reasonable and predictable amount of time.     Precautions / Restrictions Precautions Precautions: Fall Restrictions Weight Bearing Restrictions: No       Mobility  Bed Mobility Overal bed mobility: Modified Independent                  Transfers Overall transfer level: Needs assistance   Transfers: Sit to/from Stand Sit to Stand: Contact guard assist           General transfer comment: cuing for hand placement    Ambulation/Gait Ambulation/Gait assistance: Contact guard assist Gait Distance (Feet): 200 Feet Assistive device: Rolling walker (2 wheels)         General Gait Details: partially reciprocal stepping pattern with fair step height/length; decreased cadence, decresaed balance reactions.  Does require continued use of RW for gait at this time.  Frequent cuing for walker position and path negotiation.  Mild SOB with gait efforts; sats >93% on 2L throughout distance  Stairs            Wheelchair Mobility     Tilt Bed    Modified Rankin (Stroke Patients Only)       Balance Overall balance assessment: Needs assistance Sitting-balance support: No upper extremity supported, Feet supported       Standing balance support: Bilateral upper extremity supported Standing balance-Leahy Scale: Fair                               Pertinent Vitals/Pain Pain Assessment Pain Assessment: No/denies pain    Home Living Family/patient expects to be discharged to:: Private residence Living Arrangements: Spouse/significant other Available Help at Discharge: Family Type of Home: House Home Access: Level entry       Home Layout: One level (2-3 steps within the home to the living  room) Home Equipment: Gilmer Mor - single point      Prior Function Prior Level of Function : Independent/Modified Independent             Mobility Comments: Mod indep with ADLs, household and limited community mobilization; intermittent use of SPC.  Does endorse 2-3 falls within previous six months (feels often related to bending over, losing balance)       Extremity/Trunk Assessment   Upper Extremity  Assessment Upper Extremity Assessment: Overall WFL for tasks assessed    Lower Extremity Assessment Lower Extremity Assessment: Overall WFL for tasks assessed (grossly at least 4/5 throughout)       Communication      Cognition Arousal: Alert Behavior During Therapy: [facility] for tasks assessed/performed Overall Cognitive Status: Within Functional Limits for tasks assessed                                          General Comments      Exercises     Assessment/Plan    PT Assessment Patient needs continued PT services  PT Problem List Decreased strength;Decreased range of motion;Decreased activity tolerance;Decreased balance;Decreased mobility;Decreased cognition;Decreased knowledge of use of DME;Decreased safety awareness;Decreased knowledge of precautions       PT Treatment Interventions Gait training;DME instruction;Stair training;Functional mobility training;Therapeutic exercise;Therapeutic activities;Patient/family education    PT Goals (Current goals can be found in the Care Plan section)  Acute Rehab PT Goals Patient Stated Goal: to go home PT Goal Formulation: With patient Time For Goal Achievement: 11/12/23 Potential to Achieve Goals: Good    Frequency Min 1X/week     Co-evaluation               AM-PAC PT "6 Clicks" Mobility  Outcome Measure Help needed turning from your back to your side while in a flat bed without using bedrails?: None Help needed moving from lying on your back to sitting on the side of a flat bed without using bedrails?: None Help needed moving to and from a bed to a chair (including a wheelchair)?: A Little Help needed standing up from a chair using your arms (e.g., wheelchair or bedside chair)?: A Little Help needed to walk in hospital room?: A Little Help needed climbing 3-5 steps with a railing? : A Little 6 Click Score: 20    End of Session Equipment Utilized During Treatment: Gait belt;Oxygen Activity  Tolerance: Patient tolerated treatment well Patient left: in chair;with call bell/phone within reach;with family/visitor present;with chair alarm set Nurse Communication: Mobility status PT Visit Diagnosis: Difficulty in walking, not elsewhere classified (R26.2);Muscle weakness (generalized) (M62.81)    Time: 2706-2376 PT Time Calculation (min) (ACUTE ONLY): 22 min   Charges:   PT Evaluation $PT Eval Moderate Complexity: 1 Mod   PT General Charges $$ ACUTE PT VISIT: 1 Visit         Mikell Camp H. Manson Passey, PT, DPT, NCS 10/29/23, 10:16 PM [phone]

## 2023-10-30 DIAGNOSIS — A419 Sepsis, unspecified organism: Secondary | ICD-10-CM | POA: Diagnosis not present

## 2023-10-30 DIAGNOSIS — T40601A Poisoning by unspecified narcotics, accidental (unintentional), initial encounter: Secondary | ICD-10-CM | POA: Diagnosis not present

## 2023-10-30 LAB — CBC WITH DIFFERENTIAL/PLATELET
Abs Immature Granulocytes: 0.04 10*3/uL (ref 0.00–0.07)
Basophils Absolute: 0 10*3/uL (ref 0.0–0.1)
Basophils Relative: 0 %
Eosinophils Absolute: 0.5 10*3/uL (ref 0.0–0.5)
Eosinophils Relative: 5 %
HCT: 27.2 % — ABNORMAL LOW (ref 36.0–46.0)
Hemoglobin: 8.4 g/dL — ABNORMAL LOW (ref 12.0–15.0)
Immature Granulocytes: 0 %
Lymphocytes Relative: 8 %
Lymphs Abs: 0.8 10*3/uL (ref 0.7–4.0)
MCH: 26.3 pg (ref 26.0–34.0)
MCHC: 30.9 g/dL (ref 30.0–36.0)
MCV: 85.3 fL (ref 80.0–100.0)
Monocytes Absolute: 1 10*3/uL (ref 0.1–1.0)
Monocytes Relative: 10 %
Neutro Abs: 7.3 10*3/uL (ref 1.7–7.7)
Neutrophils Relative %: 77 %
Platelets: 412 10*3/uL — ABNORMAL HIGH (ref 150–400)
RBC: 3.19 MIL/uL — ABNORMAL LOW (ref 3.87–5.11)
RDW: 13.9 % (ref 11.5–15.5)
WBC: 9.7 10*3/uL (ref 4.0–10.5)
nRBC: 0 % (ref 0.0–0.2)

## 2023-10-30 LAB — BASIC METABOLIC PANEL
Anion gap: 10 (ref 5–15)
BUN: 16 mg/dL (ref 8–23)
CO2: 31 mmol/L (ref 22–32)
Calcium: 8.7 mg/dL — ABNORMAL LOW (ref 8.9–10.3)
Chloride: 97 mmol/L — ABNORMAL LOW (ref 98–111)
Creatinine, Ser: 0.85 mg/dL (ref 0.44–1.00)
GFR, Estimated: >60 mL/min (ref >60)
Glucose, Bld: 90 mg/dL (ref 70–99)
Potassium: 4.1 mmol/L (ref 3.5–5.1)
Sodium: 138 mmol/L (ref 135–145)

## 2023-10-30 MED ORDER — LOPERAMIDE HCL 2 MG PO CAPS
4.0000 mg | ORAL_CAPSULE | Freq: Once | ORAL | Status: AC
  Administered 2023-10-30: 4 mg via ORAL
  Filled 2023-10-30: qty 2

## 2023-10-30 MED ORDER — AMOXICILLIN-POT CLAVULANATE 875-125 MG PO TABS: 1.0000 | ORAL_TABLET | Freq: Two times a day (BID) | ORAL | 0 refills | Status: AC

## 2023-10-30 NOTE — Discharge Summary (Signed)
Physician Discharge Summary   Patient: Alexandra [name] MRN: [id number] DOB: September 01, 1941  Admit date:     10/27/2023  Discharge date: 10/30/23  Discharge Physician: Alexandra [name]   PCP: Alexandra Kern, MD   Recommendations at discharge:  Follow-up with PCP as well as oncologist  Discharge Diagnoses: Sepsis due to pneumonia [facility]) Suspecting aspiration pneumonia related to acute toxic metabolic encephalopathy Anemia of chronic disease Overdose of opiate or related narcotic, accidental or unintentional, initial encounter (HCC) Acute toxic-metabolic encephalopathy Chronic continuous use of opiates for chronic pain Polypharmacy Chronic obstructive pulmonary disease (COPD) (HCC) Chronic respiratory failure with hypoxia Malignant neoplasm of upper lobe of right lung (HCC) Anxiety and depression     Hospital Course: Alexandra [name] is a 82 y.o. female with medical history significant for COPD on home O2 at 2 L, bronchogenic carcinoma, chronic pain syndrome, insomnia, depression and anxiety on multiple sedating meds (Ambien, Norco, gabapentin, amitriptyline and alprazolam), dyslipidemia, insomnia and neuropathy who is being admitted with sepsis secondary to pneumonia.  Her respiratory function improved on IV antibiotic therapy.  Patient able to walk about with physical therapy with recommendation for home health for discharge.   Consultants: None Procedures performed: None Disposition: Home Diet recommendation:  Cardiac diet DISCHARGE MEDICATION: Allergies as of 10/30/2023       Reactions   Avelox [moxifloxacin Hcl] Shortness Of Breath, Swelling   Moxifloxacin Anaphylaxis   Cefadroxil Itching   Elemental Sulfur Other (See Comments)   Reaction as a child and not sure what.         Medication List     STOP taking these medications    ALPRAZolam 0.5 MG tablet Commonly known as: XANAX       TAKE these medications    albuterol 108 (90 Base) MCG/ACT inhaler Commonly known as:  VENTOLIN HFA Inhale 1-2 puffs into the lungs every 6 (six) hours as needed for wheezing or shortness of breath.   albuterol (2.5 MG/3ML) 0.083% nebulizer solution Commonly known as: PROVENTIL Take 3 mLs (2.5 mg total) by nebulization every 6 (six) hours as needed for Wheezing   amitriptyline 50 MG tablet Commonly known as: ELAVIL Take 50 mg by mouth at bedtime.   amoxicillin-clavulanate 875-125 MG tablet Commonly known as: AUGMENTIN Take 1 tablet by mouth 2 (two) times daily for 4 days.   aspirin 81 MG chewable tablet Chew 81 mg by mouth daily.   benzonatate 200 MG capsule Commonly known as: TESSALON Take 200 mg by mouth 3 (three) times daily as needed for cough.   Breztri Aerosphere 160-9-4.8 MCG/ACT Aero Generic drug: Budeson-Glycopyrrol-Formoterol Inhale 1 puff into the lungs 2 (two) times daily.   celecoxib 200 MG capsule Commonly known as: CELEBREX Take 200 mg by mouth 2 (two) times daily.   cholecalciferol 25 MCG (1000 UNIT) tablet Commonly known as: VITAMIN D3 Take 1,000 Units by mouth daily.   CYCLOBENZAPRINE HCL PO Take 1 tablet by mouth as needed.   gabapentin 600 MG tablet Commonly known as: NEURONTIN Take 1,200 mg by mouth 3 (three) times daily.   HYDROcodone-acetaminophen 10-325 MG tablet Commonly known as: NORCO Take 1 tablet by mouth 4 (four) times daily as needed for moderate pain (pain score 4-6) or severe pain (pain score 7-10).   latanoprost 0.005 % ophthalmic solution Commonly known as: XALATAN Place 1 drop into both eyes at bedtime.   OXYGEN Inhale 2 L into the lungs continuous.   pantoprazole 40 MG tablet Commonly known as: PROTONIX Take 40  mg by mouth 2 (two) times daily.   simvastatin 40 MG tablet Commonly known as: ZOCOR Take 40 mg by mouth every evening.   Trelegy Ellipta 200-62.5-25 MCG/ACT Aepb Generic drug: Fluticasone-Umeclidin-Vilant Inhale 1 puff into the lungs daily.   zolpidem 10 MG tablet Commonly known as:  AMBIEN Take 0.5 tablets (5 mg total) by mouth at bedtime.               Durable Medical Equipment  (From admission, onward)           Start     Ordered   10/30/23 1045  For home use only DME Bedside commode  Once       Question:  Patient needs a bedside commode to treat with the following condition  Answer:  Ambulatory dysfunction   10/30/23 1044   10/29/23 1552  For home use only DME Walker rolling  Once       Question Answer Comment  Walker: With 5 Inch Wheels   Patient needs a walker to treat with the following condition Ambulatory dysfunction      10/29/23 1551            Discharge Exam: Filed Weights   10/27/23 0417 10/28/23 1839  Weight: 61.4 kg 59 kg   General: Elderly female on 3 L of oxygen HENT:     Head: Normocephalic and atraumatic.  Cardiovascular:     Rate and Rhythm: Regular rhythm.  Tachycardia improved    Heart sounds: Normal heart sounds.  Pulmonary:     Effort: Pulmonary effort is normal.     Breath sounds: Normal breath sounds.  Abdominal:     Palpations: Abdomen is soft.     Tenderness: There is no abdominal tenderness.  Neurological: Alert and oriented  Condition at discharge: good  The results of significant diagnostics from this hospitalization (including imaging, microbiology, ancillary and laboratory) are listed below for reference.   Imaging Studies: CT Angio Chest PE W and/or Wo Contrast  Result Date: 10/27/2023 CLINICAL DATA:  82 year old female status post unwitnessed fall, found down and unresponsive. Right upper lobe lung cancer diagnosed by PET-CT in February this year. EXAM: CT ANGIOGRAPHY CHEST WITH CONTRAST TECHNIQUE: Multidetector CT imaging of the chest was performed using the standard protocol during bolus administration of intravenous contrast. Multiplanar CT image reconstructions and MIPs were obtained to evaluate the vascular anatomy. RADIATION DOSE REDUCTION: This exam was performed according to the departmental  dose-optimization program which includes automated exposure control, adjustment of the mA and/or kV according to patient size and/or use of iterative reconstruction technique. CONTRAST:  75mL OMNIPAQUE IOHEXOL 350 MG/ML SOLN COMPARISON:  CTA chest 12/20/2021. 02/14/2023 PET-CT. Restaging chest CT 08/27/2023. FINDINGS: Cardiovascular: Good contrast bolus timing in the pulmonary arterial tree. No suspicious pulmonary artery filling defect. Calcified aortic atherosclerosis. Calcified coronary artery atherosclerosis. Heart size is stable, within normal limits. No pericardial effusion. Mediastinum/Nodes: Traction on the superior right mediastinum now related to right upper lung abnormality. Chronic but increased right hilar lymph nodes now individually up to 17 mm short axis (13 mm last year and 10 mm in September), appear reactive. No superimposed mediastinal mass. Lungs/Pleura: Centrilobular emphysema. Major airways remain patent. Spiculated posterior right upper lobe abnormal lung nodule which was near the major fissure on February PET-CT is now obscured by patchy and confluent upper lung opacity. Some of this is masslike and spiculated now (series 5, image 42), but compared to 08/27/2023, has mildly regressed. Chronic scarring and architectural distortion in the  right middle and lower lobes also but extensive new/increased tree-in-bud nodular opacity in those lobes since September (series 5, image 107). New consolidation in the right middle lobe. Trace right pleural effusion. Patchy chronic solid and sub solid left upper lobe lung opacity including peripherally on series 5, image 70 is very similar to the February PET. However, there is new patchy peribronchial opacity in the left upper lobe on series 5, image 79. And similar new opacity in the left lower lobe series 124 Images 124 and 126.  No left pleural effusion. Upper Abdomen: Negative visible mostly noncontrast gallbladder, spleen, pancreas, adrenal glands.  Chronic benign appearing calcification of the posterior liver capsule, stable visible liver. No upper abdominal free air or free fluid. Visible bowel is nondilated. Compared to 02/14/2023 PET-CT there is evidence of new bilateral hydronephrosis and hydroureter, partially visible. No acute pararenal stranding identified. Musculoskeletal: Stable visualized osseous structures. Osteopenia with advanced thoracic spine degeneration and occasional interbody ankylosis. Review of the MIP images confirms the above findings. IMPRESSION: 1. Negative for acute pulmonary embolus. 2. Chronic lung disease with Emphysema (ICD10-J43.9) and right upper lobe lung cancer. Acute right lung multilobar patchy and tree-in-bud nodular opacity compatible with acute respiratory infection. And evidence of early associated multilobar left lung bronchopneumonia. Trace right pleural effusion. 3. Progressed and indeterminate but possibly reactive (#2) right hilar lymph nodes. 4. Partially visible bilateral hydronephrosis and hydroureter new since February. Query urinary retention. 5.  Aortic Atherosclerosis (ICD10-I70.0). Electronically Signed   By: Odessa Fleming M.D.   On: 10/27/2023 04:26   CT Cervical Spine Wo Contrast  Result Date: 10/27/2023 CLINICAL DATA:  82 year old female status post unwitnessed fall, found down and unresponsive. EXAM: CT CERVICAL SPINE WITHOUT CONTRAST TECHNIQUE: Multidetector CT imaging of the cervical spine was performed without intravenous contrast. Multiplanar CT image reconstructions were also generated. RADIATION DOSE REDUCTION: This exam was performed according to the departmental dose-optimization program which includes automated exposure control, adjustment of the mA and/or kV according to patient size and/or use of iterative reconstruction technique. COMPARISON:  Head CT today.  Cervical spine CT 12/16/2021.  Sinuses FINDINGS: Alignment: Reversal of the normal cervical lordosis not significantly changed from 2022.  Chronic mild degenerative appearing spondylolisthesis in the cervical spine with associated chronic left side facet arthropathy. Cervicothoracic junction alignment is within normal limits. Stable posterior element alignment. Skull base and vertebrae: Chronic osteopenia. Visualized skull base is intact. No atlanto-occipital dissociation. C1 and C2 appear intact and aligned. No acute osseous abnormality identified. Soft tissues and spinal canal: No prevertebral fluid or swelling. No visible canal hematoma. Partially retropharyngeal course of both carotids. Stable and negative visible noncontrast neck soft tissues. Disc levels: Stable chronic cervical spine degeneration, predominantly left side facet arthropathy with multilevel mild spondylolisthesis. And advanced chronic C5-C6 disc and endplate degeneration. Probably no significant spinal stenosis. Upper chest: Visible upper thoracic levels appear grossly intact. Abnormal right lung apex, probably scarring. CTA chest today reported separately. Other: Bulky torus palatinus, normal variant. IMPRESSION: 1. No acute traumatic injury identified in the cervical spine. 2. Stable chronic cervical spine degeneration since 2022. Electronically Signed   By: Odessa Fleming M.D.   On: 10/27/2023 04:15   CT HEAD WO CONTRAST ( )  Result Date: 10/27/2023 CLINICAL DATA:  82 year old female status post unwitnessed fall, found down and unresponsive. EXAM: CT HEAD WITHOUT CONTRAST TECHNIQUE: Contiguous axial images were obtained from the base of the skull through the vertex without intravenous contrast. RADIATION DOSE REDUCTION: This exam was performed according to  the departmental dose-optimization program which includes automated exposure control, adjustment of the mA and/or kV according to patient size and/or use of iterative reconstruction technique. COMPARISON:  Head CT 12/16/2021. FINDINGS: Brain: Stable cerebral volume. No midline shift, ventriculomegaly, mass effect, evidence of  mass lesion, intracranial hemorrhage or evidence of cortically based acute infarction. Patchy chronic basal ganglia vascular calcifications. Gray-white matter differentiation appears stable and within normal limits for age. Incidental chronic cystic change of the pineal gland. Vascular: Calcified atherosclerosis at the skull base. No suspicious intracranial vascular hyperdensity. Skull: Chronic osteopenia.  No fracture identified. Sinuses/Orbits: Unchanged posterior ethmoid and sphenoid sinus mucosal thickening from 2 years ago. Other Visualized paranasal sinuses and mastoids are stable and well aerated. Other: No acute orbit or scalp soft tissue finding. IMPRESSION: 1. No acute intracranial abnormality or acute traumatic injury identified. 2. Stable and negative for age noncontrast CT appearance of the brain. Electronically Signed   By: Odessa Fleming M.D.   On: 10/27/2023 04:11   DG Chest Portable 1 View  Result Date: 10/27/2023 CLINICAL DATA:  Status post fall. EXAM: PORTABLE CHEST 1 VIEW COMPARISON:  October 17, 2023 FINDINGS: The heart size and mediastinal contours are within normal limits. There is marked severity calcification of the aortic arch. The lungs are hyperinflated with mild, diffuse, chronic appearing increased interstitial lung markings. Mild atelectasis and/or infiltrate is seen within the right lung base, with mild, stable areas of scarring seen within the right upper lobe. There is a small right pleural effusion. No pneumothorax is identified. Multilevel degenerative changes are present throughout the thoracic spine. IMPRESSION: 1. Mild right basilar atelectasis and/or infiltrate. 2. Mild, stable right upper lobe linear scarring. 3. Small right pleural effusion. Electronically Signed   By: Aram Candela M.D.   On: 10/27/2023 03:59   DG Chest 2 View  Result Date: 10/22/2023 CLINICAL DATA:  Cough for several weeks. EXAM: CHEST - 2 VIEW COMPARISON:  March 19, 2023.  May 29, 2023.  August 27, 2023. FINDINGS: Stable cardiomediastinal silhouette. Hyperexpansion of the lungs is noted. Left lung is clear. Small right pleural effusion is noted. Increased right upper lobe density is noted which may represent scarring, but pneumonia cannot be excluded. The visualized skeletal structures are unremarkable. IMPRESSION: Right upper lobe density is noted which may correspond to abnormality seen on prior CT scan of August 27, 2023, and may represent active inflammation or possibly scarring. Underlying neoplasm cannot be excluded. Follow-up chest CT is recommended for further evaluation. Electronically Signed   By: Lupita Raider M.D.   On: 10/22/2023 16:33    Microbiology: Results for orders placed or performed during the hospital encounter of 10/27/23  Blood Culture (routine x 2)     Status: None (Preliminary result)   Collection Time: 10/27/23  3:23 AM   Specimen: BLOOD LEFT ARM  Result Value Ref Range Status   Specimen Description BLOOD LEFT ARM  Final   Special Requests   Final    BOTTLES DRAWN AEROBIC AND ANAEROBIC Blood Culture adequate volume   Culture   Final    NO GROWTH 3 DAYS Performed at [facility], [address], [place], [place] [address]    Report Status PENDING  Incomplete  Blood Culture (routine x 2)     Status: None (Preliminary result)   Collection Time: 10/27/23  3:36 AM   Specimen: BLOOD LEFT ARM  Result Value Ref Range Status   Specimen Description BLOOD LEFT ARM  Final   Special Requests  Final    BOTTLES DRAWN AEROBIC AND ANAEROBIC Blood Culture adequate volume   Culture   Final    NO GROWTH 3 DAYS Performed at [facility], [address] [place], [place] [address]    Report Status PENDING  Incomplete  MRSA Next Gen by PCR, Nasal     Status: None   Collection Time: 10/27/23 11:41 AM   Specimen: Nasal Mucosa; Nasal Swab  Result Value Ref Range Status   MRSA by PCR Next Gen NOT DETECTED NOT DETECTED Final    Comment: (NOTE) The  GeneXpert MRSA Assay (FDA approved for NASAL specimens only), is one component of a comprehensive MRSA colonization surveillance program. It is not intended to diagnose MRSA infection nor to guide or monitor treatment for MRSA infections. Test performance is not FDA approved in patients less than 42 years old. Performed at [facility], [address] [place], [place] [address]     Labs: CBC: Recent Labs  Lab 10/27/23 0122 10/27/23 3244 10/28/23 0428 10/29/23 0622 10/30/23 0459  WBC 21.8*  --  15.6* 14.9* 9.7  NEUTROABS 18.4*  --  13.6* 13.0* 7.3  HGB 8.2* 7.4* 7.5* 9.8* 8.4*  HCT 27.0*  --  24.7* 31.1* 27.2*  MCV 88.2  --  87.9 85.2 85.3  PLT 366  --  293 481* 412*   Basic Metabolic Panel: Recent Labs  Lab 10/27/23 0122 10/28/23 0428 10/29/23 0622 10/30/23 0459  NA 136 135 136 138  K 4.1 3.7 3.3* 4.1  CL 96* 99 95* 97*  CO2 30 30 28 31   GLUCOSE 128* 100* 70 90  BUN 25* 13 18 16   CREATININE 1.34* 0.90 0.84 0.85  CALCIUM 8.8* 8.1* 9.0 8.7*  MG 2.1  --   --   --    Liver Function Tests: Recent Labs  Lab 10/27/23 0122  AST 13*  ALT 11  ALKPHOS 125  BILITOT 0.3  PROT 8.2*  ALBUMIN 3.4*   CBG: No results for input(s): "GLUCAP" in the last 168 hours.  Discharge time spent:  34 minutes.  Signed: Loyce Dys, MD [facility] 10/30/2023

## 2023-10-30 NOTE — Care Management Important Message (Signed)
Important Message  Patient Details  Name: Alexandra [name] MRN: [id number] Date of Birth: 09/10/1941   Important Message Given:  N/A - LOS <3 / Initial given by admissions     Alexandra [name] 10/30/2023, 11:28 AM

## 2023-10-30 NOTE — Plan of Care (Signed)

## 2023-10-30 NOTE — Progress Notes (Signed)
Patient is not able to walk the distance required to go the bathroom, or he/she is unable to safely negotiate stairs required to access the bathroom.  A 3in1 BSC will alleviate this problem  Wanda Cellucci Holcomb, LCSW, MSW, MHA [phone]  

## 2023-10-30 NOTE — TOC Transition Note (Addendum)
Transition of Care [facility]) - CM/SW Discharge Note   Patient Details  Name: Alexandra [name] MRN: [id number] Date of Birth: [birth year]  [facility] CM/SW Contact:  Darolyn Rua, LCSW Phone Number: 10/30/2023, 10:50 AM   Clinical Narrative:     Update 11:08 am: Per [employer] patient recently received 3in1 and RW last year, CSW spoke with husband who confirms they have dme and that patient may have been confused when talking with this CSW. NO dme needs, [facility] able to accept patient for [facility]. No additional discharge needs.      Patient to discharge home today, she is agreeable to [facility], preference for [facility] who she has had in the past. Referral sent to [facility] with [facility] pending acceptance.   Patient requesting 3in1 and RW, ordered via Jon with [employer] to be delivered at bedside.   No additional discharge needs a this time.    Final next level of care: Home w Home Health Services Barriers to Discharge: No Barriers Identified   Patient Goals and CMS Choice CMS Medicare.gov Compare Post Acute Care list provided to:: Patient Choice offered to / list presented to : Patient  Discharge Placement                         Discharge Plan and Services Additional resources added to the After Visit Summary for                  DME Arranged: Walker rolling, 3-N-1 DME Agency: [employer] Date DME Agency Contacted: 10/30/23 Time DME Agency Contacted: 1040 Representative spoke with at DME Agency: Cletis Athens            Social Determinants of Health (SDOH) Interventions SDOH Screenings   Food Insecurity: No Food Insecurity (10/29/2023)  Housing: Low Risk  (10/29/2023)  Transportation Needs: No Transportation Needs (10/29/2023)  Utilities: Not At Risk (10/29/2023)  Alcohol Screen: Low Risk  (03/27/2023)  Depression (PHQ2-9): Low Risk  (03/27/2023)  Financial Resource Strain: Low Risk  (07/17/2023)   Received from [facility]  Physical Activity:  Inactive (03/27/2023)  Social Connections: Socially Integrated (03/27/2023)  Stress: No Stress Concern Present (03/27/2023)  Tobacco Use: Medium Risk (10/27/2023)     Readmission Risk Interventions     No data to display

## 2023-11-01 LAB — CULTURE, BLOOD (ROUTINE X 2)
Culture: NO GROWTH
Culture: NO GROWTH
Special Requests: ADEQUATE
Special Requests: ADEQUATE

## 2024-01-08 ENCOUNTER — Ambulatory Visit
Admission: RE | Admit: 2024-01-08 | Discharge: 2024-01-08 | Disposition: A | Discharge status: Home / Self Care | Payer: PPO | Source: Ambulatory Visit | Attending: Oncology | Admitting: Oncology

## 2024-01-08 ENCOUNTER — Other Ambulatory Visit: Payer: PPO

## 2024-01-08 DIAGNOSIS — Z08 Encounter for follow-up examination after completed treatment for malignant neoplasm: Secondary | ICD-10-CM | POA: Insufficient documentation

## 2024-01-08 DIAGNOSIS — Z85118 Personal history of other malignant neoplasm of bronchus and lung: Secondary | ICD-10-CM | POA: Diagnosis present

## 2024-01-10 ENCOUNTER — Ambulatory Visit: Payer: PPO

## 2024-01-18 ENCOUNTER — Inpatient Hospital Stay: Payer: PPO | Attending: Oncology | Admitting: Oncology

## 2024-01-18 ENCOUNTER — Encounter: Payer: Self-pay | Admitting: Oncology

## 2024-01-18 VITALS — BP 121/73 | HR 98 | Temp 96.6°F | Resp 18 | Wt 137.6 lb

## 2024-01-18 DIAGNOSIS — J9611 Chronic respiratory failure with hypoxia: Secondary | ICD-10-CM | POA: Insufficient documentation

## 2024-01-18 DIAGNOSIS — S22060A Wedge compression fracture of T7-T8 vertebra, initial encounter for closed fracture: Secondary | ICD-10-CM

## 2024-01-18 DIAGNOSIS — C3411 Malignant neoplasm of upper lobe, right bronchus or lung: Secondary | ICD-10-CM | POA: Diagnosis present

## 2024-01-18 DIAGNOSIS — Z9981 Dependence on supplemental oxygen: Secondary | ICD-10-CM | POA: Diagnosis not present

## 2024-01-18 DIAGNOSIS — Z85118 Personal history of other malignant neoplasm of bronchus and lung: Secondary | ICD-10-CM | POA: Diagnosis not present

## 2024-01-18 DIAGNOSIS — Z87891 Personal history of nicotine dependence: Secondary | ICD-10-CM | POA: Insufficient documentation

## 2024-01-18 DIAGNOSIS — Z08 Encounter for follow-up examination after completed treatment for malignant neoplasm: Secondary | ICD-10-CM

## 2024-01-19 NOTE — Progress Notes (Signed)
Hematology/Oncology Consult note [facility]  Telephone:([phone] [phone]  Patient Care Team: [name], MD as PCP - General (Family Medicine) Glory Buff, RN as Oncology Nurse Navigator Creig Hines, MD as Consulting Physician (Oncology)   Name of the patient: Alexandra [name]  [id number]  1941-02-10   Date of visit: 01/19/24  Diagnosis- stage I right upper lobe lung cancer adenocarcinoma   Chief complaint/ Reason for visit- routine f/u of lung cancer s/p sbrt  Heme/Onc history: Patient is a 83 year old female with a past medical history is significant for 45-pack-year smoking and who has been on home oxygen 2 L.  She has a history of severe COPD.  She underwent CT chest lung cancer screening protocol in February 2024 which was concerning for a spiculated nodule in the right apex.  This was followed by PET CT scan on 02/14/2023 which showed right upper lobe pulmonary nodule 1.3 cm hypermetabolic with an SUV of 12.67.  No enlarged mediastinal or hilar lymphadenopathy.  No evidence of supraclavicular or axillary adenopathy.  12 mm subsolid right lower lobe lymph node with an FDG of 1.6 and there was another nodular density in the right lower lobe with an SUV of 1.9 which was deemed to be indeterminate.  She had a CT super D chest protocol on 03/13/2023 which showed similar findings.   Patient had bronchoscopy with Dr. Karna Christmas and right upper lobe lung biopsy was consistent with non-small cell carcinoma favoring adenocarcinoma.  Station 4R, 10 R lymph node biopsy was negative for malignancy.  Station 7 lymph node biopsy was suspicious for malignancy with single atypical epithelial group present in the cellblock but not diagnostic.  Bronchial lavage was also positive for Moraxella catarrhalis and patient completed course of antibiotics.  Subsequently she has developed chronic hypoxic respiratory failure and is on 2 to 3 L of oxygen at home.  She received  SBRT to her right upper lobe lung lesion in April 2024.  Interval history-she is presently at her baseLine state of health.  She was admitted to the hospital in November 2024 for aspiration pneumonia.  She has fatigue and exertional shortness of breath which has remained unchanged  ECOG PS- 3 Pain scale- 0   Review of systems- Review of Systems  Constitutional:  Positive for malaise/fatigue. Negative for chills, fever and weight loss.  HENT:  Negative for congestion, ear discharge and nosebleeds.   Eyes:  Negative for blurred vision.  Respiratory:  Positive for shortness of breath. Negative for cough, hemoptysis, sputum production and wheezing.   Cardiovascular:  Negative for chest pain, palpitations, orthopnea and claudication.  Gastrointestinal:  Negative for abdominal pain, blood in stool, constipation, diarrhea, heartburn, melena, nausea and vomiting.  Genitourinary:  Negative for dysuria, flank pain, frequency, hematuria and urgency.  Musculoskeletal:  Negative for back pain, joint pain and myalgias.  Skin:  Negative for rash.  Neurological:  Negative for dizziness, tingling, focal weakness, seizures, weakness and headaches.  Endo/Heme/Allergies:  Does not bruise/bleed easily.  Psychiatric/Behavioral:  Negative for depression and suicidal ideas. The patient does not have insomnia.       Allergies  Allergen Reactions   Avelox [Moxifloxacin Hcl] Shortness Of Breath and Swelling   Moxifloxacin Anaphylaxis   Cefadroxil Itching   Elemental Sulfur Other (See Comments)    Reaction as a child and not sure what.      Past Medical History:  Diagnosis Date   Anxiety    COPD (chronic obstructive pulmonary  disease) (HCC)    Depression    GERD (gastroesophageal reflux disease)    Hyperlipidemia    Pulmonary nodules/lesions, multiple    Right femoral fracture [facility])      Past Surgical History:  Procedure Laterality Date   ABDOMINAL HYSTERECTOMY  1975   COLONOSCOPY     COLOSTOMY   2012   repaired and reduced in 2013   intestinal rupture  2012   INTRAMEDULLARY (IM) NAIL INTERTROCHANTERIC Right 12/17/2021   Procedure: INTRAMEDULLARY (IM) NAIL INTERTROCHANTRIC;  Surgeon: Ross Marcus, MD;  Location: [facility] ORS;  Service: Orthopedics;  Laterality: Right;   TAKE DOWN OF INTESTINAL FISTULA  2013   TUMOR EXCISION  2013   hospitalized and drained.    VIDEO BRONCHOSCOPY WITH ENDOBRONCHIAL ULTRASOUND N/A 03/19/2023   Procedure: VIDEO BRONCHOSCOPY WITH ENDOBRONCHIAL ULTRASOUND;  Surgeon: Vida Rigger, MD;  Location: [facility] ORS;  Service: Thoracic;  Laterality: N/A;    Social History   Socioeconomic History   Marital status: Married    Spouse name: Not on file   Number of children: Not on file   Years of education: Not on file   Highest education level: Not on file  Occupational History   Not on file  Tobacco Use   Smoking status: Former    Current packs/day: 0.00    Average packs/day: 1 pack/day for 37.0 years (37.0 ttl pk-yrs)    Types: Cigarettes    Start date: 51    Quit date: 2012    Years since quitting: 13.0   Smokeless tobacco: Never   Tobacco comments:    stopped in 2021  Vaping Use   Vaping status: Never Used  Substance and Sexual Activity   Alcohol use: Never   Drug use: Never   Sexual activity: Not Currently  Other Topics Concern   Not on file  Social History Narrative   Not on file   Social Drivers of Health   Financial Resource Strain: Low Risk  (07/17/2023)   Received from [facility]   Overall Financial Resource Strain (CARDIA)    Difficulty of Paying Living Expenses: Not hard at all  Food Insecurity: No Food Insecurity (10/29/2023)   Hunger Vital Sign    Worried About Running Out of Food in the Last Year: Never true    Ran Out of Food in the Last Year: Never true  Transportation Needs: No Transportation Needs (10/29/2023)   PRAPARE - Administrator, Civil Service (Medical): No    Lack of  Transportation (Non-Medical): No  Physical Activity: Inactive (03/27/2023)   Exercise Vital Sign    Days of Exercise per Week: 0 days    Minutes of Exercise per Session: 0 min  Stress: No Stress Concern Present (03/27/2023)   [employer] of Occupational Health - Occupational Stress Questionnaire    Feeling of Stress : Only a little  Social Connections: Socially Integrated (03/27/2023)   Social Connection and Isolation Panel [NHANES]    Frequency of Communication with Friends and Family: More than three times a week    Frequency of Social Gatherings with Friends and Family: Once a week    Attends Religious Services: 1 to 4 times per year    Active Member of [employer] or Organizations: Yes    Attends Banker Meetings: Never    Marital Status: Married  Catering manager Violence: Not At Risk (10/29/2023)   Humiliation, Afraid, Rape, and Kick questionnaire    Fear of Current or Ex-Partner: No  Emotionally Abused: No    Physically Abused: No    Sexually Abused: No    Family History  Problem Relation Age of Onset   Heart disease Mother    Heart disease Father      Current Outpatient Medications:    albuterol (PROVENTIL) (2.5 MG/3ML) 0.083% nebulizer solution, Take 3 mLs (2.5 mg total) by nebulization every 6 (six) hours as needed for Wheezing, Disp: , Rfl:    albuterol (VENTOLIN HFA) 108 (90 Base) MCG/ACT inhaler, Inhale 1-2 puffs into the lungs every 6 (six) hours as needed for wheezing or shortness of breath., Disp: , Rfl:    amitriptyline (ELAVIL) 50 MG tablet, Take 50 mg by mouth at bedtime., Disp: , Rfl:    aspirin 81 MG chewable tablet, Chew 81 mg by mouth daily., Disp: , Rfl:    benzonatate (TESSALON) 200 MG capsule, Take 200 mg by mouth 3 (three) times daily as needed for cough., Disp: , Rfl:    Budeson-Glycopyrrol-Formoterol (BREZTRI AEROSPHERE) 160-9-4.8 MCG/ACT AERO, Inhale 1 puff into the lungs 2 (two) times daily., Disp: , Rfl:    celecoxib (CELEBREX) 200 MG  capsule, Take 200 mg by mouth 2 (two) times daily., Disp: , Rfl:    cholecalciferol (VITAMIN D3) 25 MCG (1000 UNIT) tablet, Take 1,000 Units by mouth daily., Disp: , Rfl:    CYCLOBENZAPRINE HCL PO, Take 1 tablet by mouth as needed., Disp: , Rfl:    gabapentin (NEURONTIN) 600 MG tablet, Take 1,200 mg by mouth 3 (three) times daily., Disp: , Rfl:    HYDROcodone-acetaminophen (NORCO) 10-325 MG tablet, Take 1 tablet by mouth 4 (four) times daily as needed for moderate pain (pain score 4-6) or severe pain (pain score 7-10)., Disp: , Rfl:    latanoprost (XALATAN) 0.005 % ophthalmic solution, Place 1 drop into both eyes at bedtime., Disp: , Rfl:    OXYGEN, Inhale 2 L into the lungs continuous., Disp: , Rfl:    pantoprazole (PROTONIX) 40 MG tablet, Take 40 mg by mouth 2 (two) times daily., Disp: , Rfl:    simvastatin (ZOCOR) 40 MG tablet, Take 40 mg by mouth every evening., Disp: , Rfl:    TRELEGY ELLIPTA 200-62.5-25 MCG/ACT AEPB, Inhale 1 puff into the lungs daily., Disp: , Rfl:    zolpidem (AMBIEN) 10 MG tablet, Take 0.5 tablets (5 mg total) by mouth at bedtime., Disp: 30 tablet, Rfl: 0  Physical exam:  Vitals:   01/18/24 1504  BP: 121/73  Pulse: 98  Resp: 18  Temp: (!) 96.6 F (35.9 C)  TempSrc: Tympanic  SpO2: 91%  Weight: 137 lb 9.6 oz (62.4 kg)   Physical Exam Constitutional:      Comments: Sitting in a wheelchair.  Appears in no acute distress  HENT:     Head: Normocephalic and atraumatic.  Cardiovascular:     Rate and Rhythm: Normal rate and regular rhythm.     Heart sounds: Normal heart sounds.  Pulmonary:     Comments: Breath sounds decreased bilaterally Abdominal:     General: Bowel sounds are normal.     Palpations: Abdomen is soft.  Skin:    General: Skin is warm and dry.  Neurological:     Mental Status: She is alert and oriented to person, place, and time.         Latest Ref Rng & Units 10/30/2023    4:59 AM  CMP  Glucose 70 - 99 mg/dL 90   BUN 8 - 23 mg/dL 16  Creatinine 0.44 - 1.00 mg/dL 9.14   Sodium 782 - 956 mmol/L 138   Potassium 3.5 - 5.1 mmol/L 4.1   Chloride 98 - 111 mmol/L 97   CO2 22 - 32 mmol/L 31   Calcium 8.9 - 10.3 mg/dL 8.7       Latest Ref Rng & Units 10/30/2023    4:59 AM  CBC  WBC 4.0 - 10.5 K/uL 9.7   Hemoglobin 12.0 - 15.0 g/dL 8.4   Hematocrit 21.3 - 46.0 % 27.2   Platelets 150 - 400 K/uL 412     No images are attached to the encounter.  CT Chest Wo Contrast Result Date: 01/14/2024 CLINICAL DATA:  Right upper lobe non-small cell lung cancer. Restaging. * Tracking Code: BO * EXAM: CT CHEST WITHOUT CONTRAST TECHNIQUE: Multidetector CT imaging of the chest was performed following the standard protocol without IV contrast. RADIATION DOSE REDUCTION: This exam was performed according to the departmental dose-optimization program which includes automated exposure control, adjustment of the mA and/or kV according to patient size and/or use of iterative reconstruction technique. COMPARISON:  10/27/2023 chest CT angiogram.  08/27/2023 chest CT. FINDINGS: Cardiovascular: Normal heart size. No significant pericardial effusion/thickening. Left anterior descending and left circumflex coronary atherosclerosis. Atherosclerotic nonaneurysmal thoracic aorta. Normal caliber pulmonary arteries. Mediastinum/Nodes: No significant thyroid nodules. Unremarkable esophagus. No axillary adenopathy. No pathologically enlarged mediastinal nodes. Mildly enlarged 1.1 cm right hilar node (series 3/image 58), decreased from 1.5 cm on 10/27/2023 chest CT using similar measurement technique. No discrete left hilar adenopathy on these noncontrast images. Lungs/Pleura: No pneumothorax. No pleural effusion. Moderate centrilobular emphysema. Mildly thickened curvilinear bandlike consolidation with associated mild volume loss and distortion in apical right upper lobe, similar and compatible with postradiation change. Previously visualized patchy consolidation in the  right middle lobe, lingula and right greater than left lower lobes has resolved since 10/27/2023 CT. Chronic mild-to-moderate patchy tree-in-bud opacity associated with mild cylindrical bronchiectasis in both lungs, most prominent in the right lower lobe, with improved tree-in-bud opacities. No new significant pulmonary nodules. Upper abdomen: No acute abnormality. Musculoskeletal: No aggressive appearing focal osseous lesions. Mild superior T7 vertebral compression fracture, new from 10/27/2023 CT. Diffuse osteopenia. Marked thoracic spondylosis. IMPRESSION: 1. Mild superior T7 vertebral compression fracture, new from 10/27/2023 CT, potentially acute, suggest correlation with directed clinical exam. Diffuse osteopenia. 2. Stable post treatment change in the right upper lung lobe without evidence of local tumor recurrence. No findings suspicious for metastatic disease in the chest. 3. Mildly enlarged right hilar node, decreased in size since 10/27/2023 chest CT, favor improving reactive node. 4. Resolved multilobar pneumonia from 10/27/2023 CT. 5. Chronic mild-to-moderate patchy tree-in-bud opacity associated with mild cylindrical bronchiectasis in both lungs, most prominent in the right lower lobe, with improved tree-in-bud opacities. Findings are compatible with chronic atypical mycobacterial infection (MAI) or recurrent aspiration. 6. Two-vessel coronary atherosclerosis. 7. Aortic Atherosclerosis (ICD10-I70.0) and Emphysema (ICD10-J43.9). These results will be called to the ordering clinician or representative by the Radiologist Assistant, and communication documented in the PACS or [employer]. Electronically Signed   By: Delbert Phenix M.D.   On: 01/14/2024 20:44     Assessment and plan- Patient is a 82 y.o. female with history of chronic hypoxic respiratory failure secondary to COPD and stage I right upper lobe lung cancer s/p SBRT here to discuss CT Scan results and further management  I have  discussed CT chest findings with patient in detail from the 1 that she had on 01/08/2024.  CT  shows stable radiation changes in the right upper lobe.  No evidence of progression at this time.  Multilobar pneumonia from November 2024 is resolved.  She has chronic inflammatory tree-in-bud opacities in both lungs which have remained stable. Also discussed the T7 vertebral compression fracture and patient reports some pain in that area.  I have asked her to discuss this further with her primary care provider and if her pain worsens I could consider referring her to interventional radiology to see if there would be a role for kyphoplasty.  This is not related to malignancy.   Visit Diagnosis 1. Encounter for follow-up surveillance of lung cancer   2. Chronic hypoxic respiratory failure (HCC)   3. Closed wedge compression fracture of T7 vertebra, initial encounter (HCC)      Dr. Owens Shark, MD, MPH [facility] at [facility] [phone] 01/19/2024 6:09 PM

## 2024-07-04 ENCOUNTER — Ambulatory Visit
Admission: RE | Admit: 2024-07-04 | Discharge: 2024-07-04 | Disposition: A | Discharge status: Home / Self Care | Payer: PPO | Source: Ambulatory Visit | Attending: Oncology | Admitting: Oncology

## 2024-07-04 DIAGNOSIS — Z85118 Personal history of other malignant neoplasm of bronchus and lung: Secondary | ICD-10-CM | POA: Diagnosis present

## 2024-07-04 DIAGNOSIS — Z08 Encounter for follow-up examination after completed treatment for malignant neoplasm: Secondary | ICD-10-CM | POA: Diagnosis present

## 2024-07-04 DIAGNOSIS — J9611 Chronic respiratory failure with hypoxia: Secondary | ICD-10-CM | POA: Diagnosis present

## 2024-07-11 ENCOUNTER — Inpatient Hospital Stay: Payer: PPO | Admitting: Nurse Practitioner

## 2024-07-17 ENCOUNTER — Inpatient Hospital Stay: Attending: Nurse Practitioner | Admitting: Nurse Practitioner

## 2024-07-17 ENCOUNTER — Encounter: Payer: Self-pay | Admitting: Nurse Practitioner

## 2024-07-17 VITALS — BP 132/70 | HR 95 | Temp 97.6°F | Resp 16 | Wt 139.0 lb

## 2024-07-17 DIAGNOSIS — Z08 Encounter for follow-up examination after completed treatment for malignant neoplasm: Secondary | ICD-10-CM | POA: Diagnosis not present

## 2024-07-17 DIAGNOSIS — Z85118 Personal history of other malignant neoplasm of bronchus and lung: Secondary | ICD-10-CM | POA: Diagnosis not present

## 2024-07-17 DIAGNOSIS — C3411 Malignant neoplasm of upper lobe, right bronchus or lung: Secondary | ICD-10-CM | POA: Insufficient documentation

## 2024-07-17 DIAGNOSIS — M4854XA Collapsed vertebra, not elsewhere classified, thoracic region, initial encounter for fracture: Secondary | ICD-10-CM | POA: Insufficient documentation

## 2024-07-17 DIAGNOSIS — J9611 Chronic respiratory failure with hypoxia: Secondary | ICD-10-CM | POA: Diagnosis not present

## 2024-07-17 DIAGNOSIS — J449 Chronic obstructive pulmonary disease, unspecified: Secondary | ICD-10-CM | POA: Insufficient documentation

## 2024-07-17 MED ORDER — DIAZEPAM 5 MG PO TABS: ORAL_TABLET | ORAL | 0 refills | Status: AC

## 2024-07-17 NOTE — Progress Notes (Signed)
 Hematology/Oncology Consult Note [facility]  Telephone:([phone] [phone]  Patient Care Team: Derick Leita POUR, MD as PCP - General (Family Medicine) Verdene Gills, RN as Oncology Nurse Navigator Melanee Annah BROCKS, MD as Consulting Physician (Oncology)   Name of the patient: Alexandra [name]  [id number]  1941/07/05   Date of visit: 07/17/24  Diagnosis- stage I right upper lobe lung cancer adenocarcinoma   Chief complaint/ Reason for visit- routine f/u of lung cancer s/p sbrt  Heme/Onc history: Patient is a 83 year old female with a past medical history is significant for 45-pack-year smoking and who has been on home oxygen 2 L.  She has a history of severe COPD.  She underwent CT chest lung cancer screening protocol in February 2024 which was concerning for a spiculated nodule in the right apex.  This was followed by PET CT scan on 02/14/2023 which showed right upper lobe pulmonary nodule 1.3 cm hypermetabolic with an SUV of 12.67.  No enlarged mediastinal or hilar lymphadenopathy.  No evidence of supraclavicular or axillary adenopathy.  12 mm subsolid right lower lobe lymph node with an FDG of 1.6 and there was another nodular density in the right lower lobe with an SUV of 1.9 which was deemed to be indeterminate.  She had a CT super D chest protocol on 03/13/2023 which showed similar findings.   Patient had bronchoscopy with Dr. Parris and right upper lobe lung biopsy was consistent with non-small cell carcinoma favoring adenocarcinoma.  Station 4R, 10 R lymph node biopsy was negative for malignancy.  Station 7 lymph node biopsy was suspicious for malignancy with single atypical epithelial group present in the cellblock but not diagnostic.  Bronchial lavage was also positive for Moraxella catarrhalis and patient completed course of antibiotics.  Subsequently she has developed chronic hypoxic respiratory failure and is on 2 to 3 L of oxygen at home.  She received SBRT  to her right upper lobe lung lesion in April 2024.  Interval history-  she is presently at her baseLine state of health.  She was admitted to the hospital in November 2024 for aspiration pneumonia.  She has fatigue and exertional shortness of breath which has remained unchanged  ECOG PS- 3 Pain scale- 0   Review of systems- Review of Systems  Constitutional:  Positive for malaise/fatigue. Negative for chills, fever and weight loss.  HENT:  Negative for congestion, ear discharge and nosebleeds.   Eyes:  Negative for blurred vision.  Respiratory:  Positive for shortness of breath. Negative for cough, hemoptysis, sputum production and wheezing.   Cardiovascular:  Negative for chest pain, palpitations, orthopnea and claudication.  Gastrointestinal:  Negative for abdominal pain, blood in stool, constipation, diarrhea, heartburn, melena, nausea and vomiting.  Genitourinary:  Negative for dysuria, flank pain, frequency, hematuria and urgency.  Musculoskeletal:  Negative for back pain, joint pain and myalgias.  Skin:  Negative for rash.  Neurological:  Negative for dizziness, tingling, focal weakness, seizures, weakness and headaches.  Endo/Heme/Allergies:  Does not bruise/bleed easily.  Psychiatric/Behavioral:  Negative for depression and suicidal ideas. The patient does not have insomnia.       Allergies  Allergen Reactions   Avelox [Moxifloxacin Hcl] Shortness Of Breath and Swelling   Moxifloxacin Anaphylaxis   Cefadroxil Itching   Elemental Sulfur Other (See Comments)    Reaction as a child and not sure what.      Past Medical History:  Diagnosis Date   Anxiety    COPD (chronic obstructive pulmonary  disease) (HCC)    Depression    GERD (gastroesophageal reflux disease)    Hyperlipidemia    Pulmonary nodules/lesions, multiple    Right femoral fracture [facility])      Past Surgical History:  Procedure Laterality Date   ABDOMINAL HYSTERECTOMY  1975   COLONOSCOPY     COLOSTOMY   2012   repaired and reduced in 2013   intestinal rupture  2012   INTRAMEDULLARY (IM) NAIL INTERTROCHANTERIC Right 12/17/2021   Procedure: INTRAMEDULLARY (IM) NAIL INTERTROCHANTRIC;  Surgeon: Rollene Cough, MD;  Location: [facility] ORS;  Service: Orthopedics;  Laterality: Right;   TAKE DOWN OF INTESTINAL FISTULA  2013   TUMOR EXCISION  2013   hospitalized and drained.    VIDEO BRONCHOSCOPY WITH ENDOBRONCHIAL ULTRASOUND N/A 03/19/2023   Procedure: VIDEO BRONCHOSCOPY WITH ENDOBRONCHIAL ULTRASOUND;  Surgeon: Parris Manna, MD;  Location: [facility] ORS;  Service: Thoracic;  Laterality: N/A;    Social History   Socioeconomic History   Marital status: Married    Spouse name: Not on file   Number of children: Not on file   Years of education: Not on file   Highest education level: Not on file  Occupational History   Not on file  Tobacco Use   Smoking status: Former    Current packs/day: 0.00    Average packs/day: 1 pack/day for 37.0 years (37.0 ttl pk-yrs)    Types: Cigarettes    Start date: 83    Quit date: 2012    Years since quitting: 13.5   Smokeless tobacco: Never   Tobacco comments:    stopped in 2021  Vaping Use   Vaping status: Never Used  Substance and Sexual Activity   Alcohol use: Never   Drug use: Never   Sexual activity: Not Currently  Other Topics Concern   Not on file  Social History Narrative   Not on file   Social Drivers of Health   Financial Resource Strain: Low Risk  (02/19/2024)   Received from [facility]   Overall Financial Resource Strain (CARDIA)    Difficulty of Paying Living Expenses: Not hard at all  Food Insecurity: No Food Insecurity (02/19/2024)   Received from [facility]   Hunger Vital Sign    Within the past 12 months, you worried that your food would run out before you got the money to buy more.: Never true    Within the past 12 months, the food you bought just didn't last and you didn't have money to get  more.: Never true  Transportation Needs: No Transportation Needs (02/19/2024)   Received from [facility] - Transportation    In the past 12 months, has lack of transportation kept you from medical appointments or from getting medications?: No    Lack of Transportation (Non-Medical): No  Physical Activity: Inactive (03/27/2023)   Exercise Vital Sign    Days of Exercise per Week: 0 days    Minutes of Exercise per Session: 0 min  Stress: No Stress Concern Present (03/27/2023)   [employer] of Occupational Health - Occupational Stress Questionnaire    Feeling of Stress : Only a little  Social Connections: Socially Integrated (03/27/2023)   Social Connection and Isolation Panel    Frequency of Communication with Friends and Family: More than three times a week    Frequency of Social Gatherings with Friends and Family: Once a week    Attends Religious Services: 1 to 4 times per year  Active Member of Clubs or Organizations: Yes    Attends Banker Meetings: Never    Marital Status: Married  Catering manager Violence: Not At Risk (10/29/2023)   Humiliation, Afraid, Rape, and Kick questionnaire    Fear of Current or Ex-Partner: No    Emotionally Abused: No    Physically Abused: No    Sexually Abused: No    Family History  Problem Relation Age of Onset   Heart disease Mother    Heart disease Father      Current Outpatient Medications:    albuterol  (PROVENTIL ) (2.5 MG/3ML) 0.083% nebulizer solution, Take 3 mLs (2.5 mg total) by nebulization every 6 (six) hours as needed for Wheezing, Disp: , Rfl:    albuterol  (VENTOLIN  HFA) 108 (90 Base) MCG/ACT inhaler, Inhale 1-2 puffs into the lungs every 6 (six) hours as needed for wheezing or shortness of breath., Disp: , Rfl:    amitriptyline  (ELAVIL ) 50 MG tablet, Take 50 mg by mouth at bedtime., Disp: , Rfl:    aspirin 81 MG chewable tablet, Chew 81 mg by mouth daily., Disp: , Rfl:    benzonatate  (TESSALON) 200 MG capsule, Take 200 mg by mouth 3 (three) times daily as needed for cough., Disp: , Rfl:    Budeson-Glycopyrrol-Formoterol (BREZTRI AEROSPHERE) 160-9-4.8 MCG/ACT AERO, Inhale 1 puff into the lungs 2 (two) times daily., Disp: , Rfl:    celecoxib  (CELEBREX ) 200 MG capsule, Take 200 mg by mouth 2 (two) times daily., Disp: , Rfl:    cholecalciferol (VITAMIN D3) 25 MCG (1000 UNIT) tablet, Take 1,000 Units by mouth daily., Disp: , Rfl:    CYCLOBENZAPRINE HCL PO, Take 1 tablet by mouth as needed., Disp: , Rfl:    gabapentin  (NEURONTIN ) 600 MG tablet, Take 1,200 mg by mouth 3 (three) times daily., Disp: , Rfl:    HYDROcodone -acetaminophen  (NORCO) 10-325 MG tablet, Take 1 tablet by mouth 4 (four) times daily as needed for moderate pain (pain score 4-6) or severe pain (pain score 7-10)., Disp: , Rfl:    latanoprost  (XALATAN ) 0.005 % ophthalmic solution, Place 1 drop into both eyes at bedtime., Disp: , Rfl:    OXYGEN, Inhale 2 L into the lungs continuous., Disp: , Rfl:    pantoprazole  (PROTONIX ) 40 MG tablet, Take 40 mg by mouth 2 (two) times daily., Disp: , Rfl:    simvastatin  (ZOCOR ) 40 MG tablet, Take 40 mg by mouth every evening., Disp: , Rfl:    TRELEGY ELLIPTA 200-62.5-25 MCG/ACT AEPB, Inhale 1 puff into the lungs daily., Disp: , Rfl:    zolpidem  (AMBIEN ) 10 MG tablet, Take 0.5 tablets (5 mg total) by mouth at bedtime., Disp: 30 tablet, Rfl: 0  Physical exam:  Vitals:   07/17/24 1335  BP: 132/70  Pulse: 95  Resp: 16  Temp: 97.6 F (36.4 C)  TempSrc: Tympanic  SpO2: 94%  Weight: 139 lb (63 kg)   Physical Exam Constitutional:      Comments: Sitting in a wheelchair.  Appears in no acute distress  HENT:     Head: Normocephalic and atraumatic.  Cardiovascular:     Rate and Rhythm: Normal rate and regular rhythm.     Heart sounds: Normal heart sounds.  Pulmonary:     Comments: Breath sounds decreased bilaterally Abdominal:     General: Bowel sounds are normal.      Palpations: Abdomen is soft.  Skin:    General: Skin is warm and dry.  Neurological:     Mental Status:  She is alert and oriented to person, place, and time.         Latest Ref Rng & Units 10/30/2023    4:59 AM  CMP  Glucose 70 - 99 mg/dL 90   BUN 8 - 23 mg/dL 16   Creatinine 9.55 - 1.00 mg/dL 9.14   Sodium 864 - 854 mmol/L 138   Potassium 3.5 - 5.1 mmol/L 4.1   Chloride 98 - 111 mmol/L 97   CO2 22 - 32 mmol/L 31   Calcium 8.9 - 10.3 mg/dL 8.7       Latest Ref Rng & Units 10/30/2023    4:59 AM  CBC  WBC 4.0 - 10.5 K/uL 9.7   Hemoglobin 12.0 - 15.0 g/dL 8.4   Hematocrit 63.9 - 46.0 % 27.2   Platelets 150 - 400 K/uL 412     No images are attached to the encounter.  CT Chest Wo Contrast Result Date: 07/11/2024 CLINICAL DATA:  Non-small cell lung cancer restaging * Tracking Code: BO * EXAM: CT CHEST WITHOUT CONTRAST TECHNIQUE: Multidetector CT imaging of the chest was performed following the standard protocol without IV contrast. RADIATION DOSE REDUCTION: This exam was performed according to the departmental dose-optimization program which includes automated exposure control, adjustment of the mA and/or kV according to patient size and/or use of iterative reconstruction technique. COMPARISON:  01/08/2024 FINDINGS: Cardiovascular: Coronary, aortic arch, and branch vessel atherosclerotic vascular disease. Mediastinum/Nodes: Clustered right apical/paraesophageal lymph nodes on images 30-33 series 2 are not pathologically enlarged. Right hilar node 1.1 cm in short axis on image 74 series 2, stable. Anterior mediastinal node 0.7 cm in short axis on image 88 series 2, stable. Lungs/Pleura: Scarring and therapy related findings in the right upper lobe and superior segment right lower lobe with increased paramediastinal and paraspinal consolidation posteriorly in the right upper lobe on image 51 series 4 compared to previous. This likely relates to regional airway plugging. Bilateral airway  thickening with scattered airway plugging in both lungs. Reticulonodular peripheral tree-in-bud opacities most notable in the right lower lobe and right middle lobe favoring atypical infectious bronchiolitis. Cylindrical bronchiectasis in the right lower lobe. A confluent nodule in the right middle lobe measuring 0.8 by 0.5 cm on image 112 series 4 is new and probably infectious/inflammatory but merit surveillance. Mild atelectasis or scarring in the posterior basal segment left lower lobe. Upper Abdomen: Stable capsular calcifications along the liver and along the anterior splenic capsule adjacent to the stomach. Abdominal aortic atherosclerosis. Musculoskeletal: New callus along the left sixth, seventh, and eighth ribs laterally compatible with healing fractures. Thoracic spondylosis. Unchanged degree of compression at the T7 compression fractures site. IMPRESSION: 1. Scarring and therapy related findings in the right upper lobe and superior segment right lower lobe with increased paramediastinal and paraspinal consolidation posteriorly in the right upper lobe compared to previous, for example on image 53 series 4. This likely relates to regional airway plugging, progressive therapy related findings, or pneumonia. 2. Bilateral airway thickening with scattered airway plugging in both lungs. Reticulonodular peripheral tree-in-bud opacities most notable in the right lower lobe and right middle lobe favoring atypical infectious bronchiolitis. Cylindrical bronchiectasis in the right lower lobe. 3. A confluent nodule in the right middle lobe measuring 0.8 by 0.5 cm is new and probably infectious/inflammatory but merits surveillance. 4. New callus along the left sixth, seventh, and eighth ribs laterally compatible with healing fractures. 5. Unchanged degree of compression at the T7 compression fractures site. 6.  Aortic Atherosclerosis (ICD10-I70.0).  Electronically Signed   By: Ryan Salvage M.D.   On: 07/11/2024  21:11   Assessment and Plan- Patient is a 83 y.o. female who returns to clinic for follow up of:   NSCLC - stage I of right upper lobe, s/p SBRT completed May 2024 who returns to clinic for surveillance. I independently reviewed imaging findings from 07/04/24 CT Chest wo contrast which showed changes of the RUL and superior segment of RLL which is likely related to plugging, bilateral airway thickening with scattered plugging and reticulonodular tree in bud opacities most notable in RLL and RML favoring atypical bronchiolitis. Confluent nodule int he RML measures 0.8 x 0.5, new but below likely too small to biopsy and small for pet (& pt preferences). Plan to repeat imaging in 3 months to ensure stability. Discussed surveillance guidelines including physical exam and chest ct +/- contrast every 3-6 mo for 3 years then every 6 mo x 2 years then LDCT annually thereafter. Congratulated on her smoking cessation and role in reducing risk of recurrence. She prefers to never have PET again d/t 'smothering' sensation- advised that these are not routinely indicated. Brain MRI also not routinely warranted and based on symptoms. We discussed survivorship topics today as well including role of pulmonology and oncology for surveillance. I will prescribe low dose valium  which has helped her undergo imaging in the past.  Chronic hypoxic respiratory failure- secondary to COPD. Managed by Dr. Fleming/pulmonology. Symptoms currently stable. If worsening symptoms, recommend f/u with Dr Theotis and possible antibiotic treatment for findings on CT today- possible developing atypical pneumonia.  Compression fracture- Recommend she follow up with PCP for possible kyphoplasty with IR and management of underlying bone health.  Rib fractures- healing; post fall.   Disposition:  3 mo- ct chest wo contrast 1-2 weeks later- see Dr Melanee for results & lung cancer surveillance- la    Visit Diagnosis 1. Encounter for follow-up  surveillance of lung cancer    Tinnie Dawn, DNP, AGNP-C, [facility] [phone] (clinic) 07/17/2024

## 2024-07-18 ENCOUNTER — Encounter: Payer: Self-pay | Admitting: Oncology

## 2024-07-20 ENCOUNTER — Ambulatory Visit: Payer: Self-pay | Admitting: Oncology

## 2024-10-09 ENCOUNTER — Telehealth: Payer: Self-pay | Admitting: Oncology

## 2024-10-09 NOTE — Telephone Encounter (Signed)
 Called pt to confirm CT appt for 10/31 - spoke w/pt and pt daughter to confirm appt - [facility]

## 2024-10-17 ENCOUNTER — Ambulatory Visit
Admission: RE | Admit: 2024-10-17 | Discharge: 2024-10-17 | Disposition: A | Discharge status: Home / Self Care | Source: Ambulatory Visit | Attending: Nurse Practitioner | Admitting: Nurse Practitioner

## 2024-10-17 DIAGNOSIS — Z08 Encounter for follow-up examination after completed treatment for malignant neoplasm: Secondary | ICD-10-CM | POA: Diagnosis present

## 2024-10-17 DIAGNOSIS — Z85118 Personal history of other malignant neoplasm of bronchus and lung: Secondary | ICD-10-CM | POA: Insufficient documentation

## 2024-10-23 ENCOUNTER — Telehealth: Payer: Self-pay | Admitting: Oncology

## 2024-10-23 NOTE — Telephone Encounter (Signed)
 Please let her know that CT results are reported and she can keep her appointment

## 2024-10-23 NOTE — Telephone Encounter (Signed)
 Alexandra [name] Pt daughter calling and states she doesn't see the pt CT results in her MyChart and wants to know will we have the results for her appt tomorrow?

## 2024-10-24 ENCOUNTER — Inpatient Hospital Stay: Attending: Oncology | Admitting: Oncology

## 2024-10-24 ENCOUNTER — Encounter: Payer: Self-pay | Admitting: Oncology

## 2024-10-24 VITALS — BP 124/68 | HR 101 | Temp 98.6°F | Resp 16 | Wt 137.5 lb

## 2024-10-24 DIAGNOSIS — C3411 Malignant neoplasm of upper lobe, right bronchus or lung: Secondary | ICD-10-CM | POA: Diagnosis present

## 2024-10-24 DIAGNOSIS — Z87891 Personal history of nicotine dependence: Secondary | ICD-10-CM | POA: Insufficient documentation

## 2024-10-24 DIAGNOSIS — Z85118 Personal history of other malignant neoplasm of bronchus and lung: Secondary | ICD-10-CM | POA: Diagnosis not present

## 2024-10-24 DIAGNOSIS — Z08 Encounter for follow-up examination after completed treatment for malignant neoplasm: Secondary | ICD-10-CM | POA: Diagnosis not present

## 2024-10-24 NOTE — Progress Notes (Signed)
 Hematology/Oncology Consult note [facility]  Telephone:([phone] [phone]  Patient Care Team: Derick Leita POUR, MD as PCP - General (Family Medicine) Verdene Gills, RN as Oncology Nurse Navigator Melanee Annah BROCKS, MD as Consulting Physician (Oncology)   Name of the patient: Alexandra [name]  [id number]  07-Dec-1941   Date of visit: 10/24/24  Diagnosis-history of stage I right upper lobe adenocarcinoma  Chief complaint/ Reason for visit-routine surveillance visit for lung cancer s/p SBRT  Heme/Onc history:  Patient is a 83 year old female with a past medical history is significant for 45-pack-year smoking and who has been on home oxygen 2 L.  She has a history of severe COPD.  She underwent CT chest lung cancer screening protocol in February 2024 which was concerning for a spiculated nodule in the right apex.  This was followed by PET CT scan on 02/14/2023 which showed right upper lobe pulmonary nodule 1.3 cm hypermetabolic with an SUV of 12.67.  No enlarged mediastinal or hilar lymphadenopathy.  No evidence of supraclavicular or axillary adenopathy.  12 mm subsolid right lower lobe lymph node with an FDG of 1.6 and there was another nodular density in the right lower lobe with an SUV of 1.9 which was deemed to be indeterminate.  She had a CT super D chest protocol on 03/13/2023 which showed similar findings.   Patient had bronchoscopy with Dr. Parris and right upper lobe lung biopsy was consistent with non-small cell carcinoma favoring adenocarcinoma.  Station 4R, 10 R lymph node biopsy was negative for malignancy.  Station 7 lymph node biopsy was suspicious for malignancy with single atypical epithelial group present in the cellblock but not diagnostic.  Bronchial lavage was also positive for Moraxella catarrhalis and patient completed course of antibiotics.  Subsequently she has developed chronic hypoxic respiratory failure and is on 2 to 3 L of oxygen at home.   She received SBRT to her right upper lobe lung lesion in April 2024.  Interval history-patient has baseline fatigue and exertional shortness of breath.  No new complaints.No recent hospitalizations.  She is not presently on any antibiotics.  ECOG PS- 3 Pain scale- 0   Review of systems- Review of Systems  Constitutional:  Positive for malaise/fatigue. Negative for chills, fever and weight loss.  HENT:  Negative for congestion, ear discharge and nosebleeds.   Eyes:  Negative for blurred vision.  Respiratory:  Positive for shortness of breath. Negative for cough, hemoptysis, sputum production and wheezing.   Cardiovascular:  Negative for chest pain, palpitations, orthopnea and claudication.  Gastrointestinal:  Negative for abdominal pain, blood in stool, constipation, diarrhea, heartburn, melena, nausea and vomiting.  Genitourinary:  Negative for dysuria, flank pain, frequency, hematuria and urgency.  Musculoskeletal:  Negative for back pain, joint pain and myalgias.  Skin:  Negative for rash.  Neurological:  Negative for dizziness, tingling, focal weakness, seizures, weakness and headaches.  Endo/Heme/Allergies:  Does not bruise/bleed easily.  Psychiatric/Behavioral:  Negative for depression and suicidal ideas. The patient does not have insomnia.       Allergies  Allergen Reactions   Avelox [Moxifloxacin Hcl] Shortness Of Breath and Swelling   Moxifloxacin Anaphylaxis   Cefadroxil Itching   Elemental Sulfur Other (See Comments)    Reaction as a child and not sure what.      Past Medical History:  Diagnosis Date   Anxiety    COPD (chronic obstructive pulmonary disease) (HCC)    Depression    GERD (gastroesophageal reflux disease)  Hyperlipidemia    Pulmonary nodules/lesions, multiple    Right femoral fracture [facility])      Past Surgical History:  Procedure Laterality Date   ABDOMINAL HYSTERECTOMY  1975   COLONOSCOPY     COLOSTOMY  2012   repaired and reduced in 2013    intestinal rupture  2012   INTRAMEDULLARY (IM) NAIL INTERTROCHANTERIC Right 12/17/2021   Procedure: INTRAMEDULLARY (IM) NAIL INTERTROCHANTRIC;  Surgeon: Rollene Cough, MD;  Location: [facility] ORS;  Service: Orthopedics;  Laterality: Right;   TAKE DOWN OF INTESTINAL FISTULA  2013   TUMOR EXCISION  2013   hospitalized and drained.    VIDEO BRONCHOSCOPY WITH ENDOBRONCHIAL ULTRASOUND N/A 03/19/2023   Procedure: VIDEO BRONCHOSCOPY WITH ENDOBRONCHIAL ULTRASOUND;  Surgeon: Parris Manna, MD;  Location: [facility] ORS;  Service: Thoracic;  Laterality: N/A;    Social History   Socioeconomic History   Marital status: Married    Spouse name: Not on file   Number of children: Not on file   Years of education: Not on file   Highest education level: Not on file  Occupational History   Not on file  Tobacco Use   Smoking status: Former    Current packs/day: 0.00    Average packs/day: 1 pack/day for 37.0 years (37.0 ttl pk-yrs)    Types: Cigarettes    Start date: 67    Quit date: 2012    Years since quitting: 13.8   Smokeless tobacco: Never   Tobacco comments:    stopped in 2021  Vaping Use   Vaping status: Never Used  Substance and Sexual Activity   Alcohol use: Never   Drug use: Never   Sexual activity: Not Currently  Other Topics Concern   Not on file  Social History Narrative   Not on file   Social Drivers of Health   Financial Resource Strain: Low Risk  (02/19/2024)   Received from [facility]   Overall Financial Resource Strain (CARDIA)    Difficulty of Paying Living Expenses: Not hard at all  Food Insecurity: No Food Insecurity (02/19/2024)   Received from [facility]   Hunger Vital Sign    Within the past 12 months, you worried that your food would run out before you got the money to buy more.: Never true    Within the past 12 months, the food you bought just didn't last and you didn't have money to get more.: Never true  Transportation Needs:  No Transportation Needs (02/19/2024)   Received from [facility] - Transportation    In the past 12 months, has lack of transportation kept you from medical appointments or from getting medications?: No    Lack of Transportation (Non-Medical): No  Physical Activity: Inactive (03/27/2023)   Exercise Vital Sign    Days of Exercise per Week: 0 days    Minutes of Exercise per Session: 0 min  Stress: No Stress Concern Present (03/27/2023)   [employer] - Occupational Stress Questionnaire    Feeling of Stress : Only a little  Social Connections: Socially Integrated (03/27/2023)   Social Connection and Isolation Panel    Frequency of Communication with Friends and Family: More than three times a week    Frequency of Social Gatherings with Friends and Family: Once a week    Attends Religious Services: 1 to 4 times per year    Active Member of [employer] or Organizations: Yes    Attends Club or  Organization Meetings: Never    Marital Status: Married  Catering Manager Violence: Not At Risk (10/29/2023)   Humiliation, Afraid, Rape, and Kick questionnaire    Fear of Current or Ex-Partner: No    Emotionally Abused: No    Physically Abused: No    Sexually Abused: No    Family History  Problem Relation Age of Onset   Heart disease Mother    Heart disease Father      Current Outpatient Medications:    albuterol  (PROVENTIL ) (2.5 MG/3ML) 0.083% nebulizer solution, Take 3 mLs (2.5 mg total) by nebulization every 6 (six) hours as needed for Wheezing, Disp: , Rfl:    albuterol  (VENTOLIN  HFA) 108 (90 Base) MCG/ACT inhaler, Inhale 1-2 puffs into the lungs every 6 (six) hours as needed for wheezing or shortness of breath., Disp: , Rfl:    amitriptyline  (ELAVIL ) 50 MG tablet, Take 50 mg by mouth at bedtime., Disp: , Rfl:    aspirin 81 MG chewable tablet, Chew 81 mg by mouth daily., Disp: , Rfl:    benzonatate (TESSALON) 200 MG capsule, Take 200 mg by mouth 3  (three) times daily as needed for cough., Disp: , Rfl:    Budeson-Glycopyrrol-Formoterol (BREZTRI AEROSPHERE) 160-9-4.8 MCG/ACT AERO, Inhale 1 puff into the lungs 2 (two) times daily., Disp: , Rfl:    celecoxib  (CELEBREX ) 200 MG capsule, Take 200 mg by mouth 2 (two) times daily., Disp: , Rfl:    cholecalciferol (VITAMIN D3) 25 MCG (1000 UNIT) tablet, Take 1,000 Units by mouth daily., Disp: , Rfl:    CYCLOBENZAPRINE HCL PO, Take 1 tablet by mouth as needed., Disp: , Rfl:    diazepam  (VALIUM ) 5 MG tablet, Take 1 tablet (5 mg) 30-60 minutes prior to Imaging., Disp: 1 tablet, Rfl: 0   gabapentin  (NEURONTIN ) 600 MG tablet, Take 1,200 mg by mouth 3 (three) times daily., Disp: , Rfl:    HYDROcodone -acetaminophen  (NORCO) 10-325 MG tablet, Take 1 tablet by mouth 4 (four) times daily as needed for moderate pain (pain score 4-6) or severe pain (pain score 7-10)., Disp: , Rfl:    latanoprost  (XALATAN ) 0.005 % ophthalmic solution, Place 1 drop into both eyes at bedtime., Disp: , Rfl:    OXYGEN, Inhale 2 L into the lungs continuous., Disp: , Rfl:    pantoprazole  (PROTONIX ) 40 MG tablet, Take 40 mg by mouth 2 (two) times daily., Disp: , Rfl:    simvastatin  (ZOCOR ) 40 MG tablet, Take 40 mg by mouth every evening., Disp: , Rfl:    TRELEGY ELLIPTA 200-62.5-25 MCG/ACT AEPB, Inhale 1 puff into the lungs daily., Disp: , Rfl:    zolpidem  (AMBIEN ) 10 MG tablet, Take 0.5 tablets (5 mg total) by mouth at bedtime., Disp: 30 tablet, Rfl: 0  Physical exam:  Vitals:   10/24/24 1323  Weight: 137 lb 8 oz (62.4 kg)   Physical Exam Eyes:     Pupils: Pupils are equal, round, and reactive to light.  Cardiovascular:     Rate and Rhythm: Normal rate and regular rhythm.     Heart sounds: Normal heart sounds.  Pulmonary:     Comments: She is on home oxygen.  Mild scattered bilateral wheezing and breath sounds decreased diffusely bilaterally Skin:    General: Skin is warm and dry.  Neurological:     Mental Status: She is  alert and oriented to person, place, and time.      I have personally reviewed labs listed below:    Latest Ref Rng & Units  10/30/2023    4:59 AM  CMP  Glucose 70 - 99 mg/dL 90   BUN 8 - 23 mg/dL 16   Creatinine 9.55 - 1.00 mg/dL 9.14   Sodium 864 - 854 mmol/L 138   Potassium 3.5 - 5.1 mmol/L 4.1   Chloride 98 - 111 mmol/L 97   CO2 22 - 32 mmol/L 31   Calcium 8.9 - 10.3 mg/dL 8.7       Latest Ref Rng & Units 10/30/2023    4:59 AM  CBC  WBC 4.0 - 10.5 K/uL 9.7   Hemoglobin 12.0 - 15.0 g/dL 8.4   Hematocrit 63.9 - 46.0 % 27.2   Platelets 150 - 400 K/uL 412    I have personally reviewed Radiology images listed below: No images are attached to the encounter.  CT Chest Wo Contrast Result Date: 10/23/2024 EXAM: CT CHEST WITHOUT CONTRAST 10/17/2024 01:02:35 PM TECHNIQUE: CT of the chest was performed without the administration of intravenous contrast. Multiplanar reformatted images are provided for review. Automated exposure control, iterative reconstruction, and/or weight based adjustment of the mA/kV was utilized to reduce the radiation dose to as low as reasonably achievable. COMPARISON: CT Chest without IV contrast 07/04/2024. Chest CT 01/08/24. CLINICAL HISTORY: Non-small cell lung cancer. FINDINGS: MEDIASTINUM: Heart and pericardium are unremarkable. Atherosclerosis of the aortic arch vessels and coronary arteries. The central airways are clear. LYMPH NODES: Stable small mediastinal and right hilar lymph nodes. No progressive thoracic adenopathy demonstrated. No axillary lymphadenopathy. LUNGS AND PLEURA: No pleural effusion or pneumothorax. Stable mild chronic lung disease with mild centrilobular emphysema, scattered central airway thickening, and scattered pulmonary scarring. There are chronic radiation changes medially in the right upper lobe with interval progressive consolidation and volume loss. Scattered pulmonary nodules are again noted. The previously described dominant right  middle lobe nodule has decreased in size, consistent with an inflammatory focus. There is a 4 mm nodule in the right lower lobe on image 111/4 which has slightly enlarged in the interval. An additional nodule in the right lower lobe measuring 6 mm on image 79/4 is unchanged. No other new or enlarging nodules are identified. No focal consolidation or pulmonary edema. SOFT TISSUES/BONES: No acute or aggressive osseous lesion is identified. There is a stable T7 compression fracture and old left-sided rib fractures. Multilevel spondylosis noted. No acute abnormality of the soft tissues. UPPER ABDOMEN: Limited images of the upper abdomen demonstrate stable capsular calcifications along the liver and spleen. No acute abnormality. IMPRESSION: 1. Chronic radiation changes in the right upper lobe with progressive consolidation and volume loss, compatible with evolving post-treatment change; recommend attention on follow-up imaging to exclude superimposed infection or recurrence if clinically suspected. 2. Pulmonary nodules with mixed interval change: slight interval enlargement of a 4 mm right lower lobe nodule and decreased size of the previously dominant right middle lobe nodule; remaining nodules stable without additional new or enlarging nodules. Recommend continued attention on follow-up. 3. No progressive thoracic adenopathy. 4. Mild centrilobular emphysema. 5. Stable chronic osseous findings without acute aggressive lesion. Electronically signed by: Elsie Perone MD 10/23/2024 08:22 AM EST RP Workstation: [id number]     Assessment and plan- Patient is a 83 y.o. female here for a routine surveillance visit for lung cancer  I have reviewed CT chest images independently and Discussed endings with the patient.  Patient underwent SBRT for right upper lobe lung lesion in April 2024.  He mainly sees areas of posttreatment changes and radiation fibrosis in that area.  She has  subcentimeter lung nodules which are mostly  remained stable.  Slight interval enlargement in the size of the right lower lobe lung nodule.  No overt signs of progression.  Will repeat CT chest in 6 months and see her thereafter.   Visit Diagnosis 1. Encounter for follow-up surveillance of lung cancer      Dr. Annah Skene, MD, MPH [facility] [phone] 10/24/2024 1:26 PM

## 2024-10-27 ENCOUNTER — Encounter: Payer: Self-pay | Admitting: Oncology

## 2024-12-21 ENCOUNTER — Inpatient Hospital Stay
Admission: EM | Admit: 2024-12-21 | Discharge: 2024-12-24 | DRG: 871 | Disposition: A | Discharge status: Home Health | Attending: Internal Medicine | Admitting: Internal Medicine

## 2024-12-21 ENCOUNTER — Other Ambulatory Visit: Payer: Self-pay

## 2024-12-21 ENCOUNTER — Emergency Department

## 2024-12-21 DIAGNOSIS — F419 Anxiety disorder, unspecified: Secondary | ICD-10-CM | POA: Diagnosis present

## 2024-12-21 DIAGNOSIS — F411 Generalized anxiety disorder: Secondary | ICD-10-CM | POA: Diagnosis present

## 2024-12-21 DIAGNOSIS — Z881 Allergy status to other antibiotic agents status: Secondary | ICD-10-CM

## 2024-12-21 DIAGNOSIS — J449 Chronic obstructive pulmonary disease, unspecified: Secondary | ICD-10-CM | POA: Diagnosis present

## 2024-12-21 DIAGNOSIS — K219 Gastro-esophageal reflux disease without esophagitis: Secondary | ICD-10-CM | POA: Diagnosis present

## 2024-12-21 DIAGNOSIS — Z7951 Long term (current) use of inhaled steroids: Secondary | ICD-10-CM

## 2024-12-21 DIAGNOSIS — Z7982 Long term (current) use of aspirin: Secondary | ICD-10-CM

## 2024-12-21 DIAGNOSIS — F1120 Opioid dependence, uncomplicated: Secondary | ICD-10-CM | POA: Diagnosis present

## 2024-12-21 DIAGNOSIS — E876 Hypokalemia: Secondary | ICD-10-CM | POA: Diagnosis not present

## 2024-12-21 DIAGNOSIS — Z1152 Encounter for screening for COVID-19: Secondary | ICD-10-CM

## 2024-12-21 DIAGNOSIS — Z79899 Other long term (current) drug therapy: Secondary | ICD-10-CM

## 2024-12-21 DIAGNOSIS — A419 Sepsis, unspecified organism: Secondary | ICD-10-CM | POA: Diagnosis not present

## 2024-12-21 DIAGNOSIS — J189 Pneumonia, unspecified organism: Secondary | ICD-10-CM | POA: Diagnosis present

## 2024-12-21 DIAGNOSIS — Z9981 Dependence on supplemental oxygen: Secondary | ICD-10-CM

## 2024-12-21 DIAGNOSIS — Z789 Other specified health status: Secondary | ICD-10-CM

## 2024-12-21 DIAGNOSIS — Z90710 Acquired absence of both cervix and uterus: Secondary | ICD-10-CM

## 2024-12-21 DIAGNOSIS — Z791 Long term (current) use of non-steroidal anti-inflammatories (NSAID): Secondary | ICD-10-CM

## 2024-12-21 DIAGNOSIS — E785 Hyperlipidemia, unspecified: Secondary | ICD-10-CM | POA: Diagnosis present

## 2024-12-21 DIAGNOSIS — R5381 Other malaise: Secondary | ICD-10-CM | POA: Diagnosis present

## 2024-12-21 DIAGNOSIS — R030 Elevated blood-pressure reading, without diagnosis of hypertension: Secondary | ICD-10-CM | POA: Insufficient documentation

## 2024-12-21 DIAGNOSIS — F329 Major depressive disorder, single episode, unspecified: Secondary | ICD-10-CM | POA: Diagnosis present

## 2024-12-21 DIAGNOSIS — I10 Essential (primary) hypertension: Secondary | ICD-10-CM | POA: Diagnosis present

## 2024-12-21 DIAGNOSIS — Z8249 Family history of ischemic heart disease and other diseases of the circulatory system: Secondary | ICD-10-CM

## 2024-12-21 DIAGNOSIS — Z882 Allergy status to sulfonamides status: Secondary | ICD-10-CM

## 2024-12-21 DIAGNOSIS — C3411 Malignant neoplasm of upper lobe, right bronchus or lung: Secondary | ICD-10-CM | POA: Diagnosis present

## 2024-12-21 DIAGNOSIS — G894 Chronic pain syndrome: Secondary | ICD-10-CM | POA: Diagnosis present

## 2024-12-21 DIAGNOSIS — K521 Toxic gastroenteritis and colitis: Secondary | ICD-10-CM | POA: Diagnosis present

## 2024-12-21 DIAGNOSIS — Z85118 Personal history of other malignant neoplasm of bronchus and lung: Secondary | ICD-10-CM

## 2024-12-21 DIAGNOSIS — Z87891 Personal history of nicotine dependence: Secondary | ICD-10-CM

## 2024-12-21 DIAGNOSIS — J9621 Acute and chronic respiratory failure with hypoxia: Secondary | ICD-10-CM | POA: Diagnosis present

## 2024-12-21 DIAGNOSIS — T3695XA Adverse effect of unspecified systemic antibiotic, initial encounter: Secondary | ICD-10-CM | POA: Diagnosis present

## 2024-12-21 DIAGNOSIS — Z933 Colostomy status: Secondary | ICD-10-CM

## 2024-12-21 DIAGNOSIS — J9611 Chronic respiratory failure with hypoxia: Secondary | ICD-10-CM | POA: Diagnosis present

## 2024-12-21 DIAGNOSIS — Z923 Personal history of irradiation: Secondary | ICD-10-CM

## 2024-12-21 DIAGNOSIS — J441 Chronic obstructive pulmonary disease with (acute) exacerbation: Principal | ICD-10-CM | POA: Diagnosis present

## 2024-12-21 DIAGNOSIS — Z87892 Personal history of anaphylaxis: Secondary | ICD-10-CM

## 2024-12-21 DIAGNOSIS — D649 Anemia, unspecified: Secondary | ICD-10-CM | POA: Diagnosis present

## 2024-12-21 DIAGNOSIS — F32A Depression, unspecified: Secondary | ICD-10-CM | POA: Diagnosis present

## 2024-12-21 DIAGNOSIS — J440 Chronic obstructive pulmonary disease with acute lower respiratory infection: Secondary | ICD-10-CM | POA: Diagnosis present

## 2024-12-21 LAB — CBC
HCT: 29.6 % — ABNORMAL LOW (ref 36.0–46.0)
Hemoglobin: 8.8 g/dL — ABNORMAL LOW (ref 12.0–15.0)
MCH: 27 pg (ref 26.0–34.0)
MCHC: 29.7 g/dL — ABNORMAL LOW (ref 30.0–36.0)
MCV: 90.8 fL (ref 80.0–100.0)
Platelets: 378 K/uL (ref 150–400)
RBC: 3.26 MIL/uL — ABNORMAL LOW (ref 3.87–5.11)
RDW: 14.5 % (ref 11.5–15.5)
WBC: 17.3 K/uL — ABNORMAL HIGH (ref 4.0–10.5)
nRBC: 0 % (ref 0.0–0.2)

## 2024-12-21 LAB — PROCALCITONIN: Procalcitonin: 0.18 ng/mL

## 2024-12-21 LAB — MAGNESIUM: Magnesium: 1.9 mg/dL (ref 1.7–2.4)

## 2024-12-21 LAB — BASIC METABOLIC PANEL WITH GFR
Anion gap: 10 (ref 5–15)
BUN: 9 mg/dL (ref 8–23)
CO2: 35 mmol/L — ABNORMAL HIGH (ref 22–32)
Calcium: 9.4 mg/dL (ref 8.9–10.3)
Chloride: 95 mmol/L — ABNORMAL LOW (ref 98–111)
Creatinine, Ser: 0.64 mg/dL (ref 0.44–1.00)
GFR, Estimated: >60 mL/min
Glucose, Bld: 114 mg/dL — ABNORMAL HIGH (ref 70–99)
Potassium: 4.2 mmol/L (ref 3.5–5.1)
Sodium: 140 mmol/L (ref 135–145)

## 2024-12-21 LAB — TROPONIN T, HIGH SENSITIVITY
Troponin T High Sensitivity: 20 ng/L — ABNORMAL HIGH (ref 0–19)
Troponin T High Sensitivity: <15 ng/L (ref 0–19)

## 2024-12-21 LAB — LACTIC ACID, PLASMA: Lactic Acid, Venous: 0.8 mmol/L (ref 0.5–1.9)

## 2024-12-21 LAB — RESP PANEL BY RT-PCR (RSV, FLU A&B, COVID)  RVPGX2
Influenza A by PCR: NEGATIVE
Influenza B by PCR: NEGATIVE
Resp Syncytial Virus by PCR: NEGATIVE
SARS Coronavirus 2 by RT PCR: NEGATIVE

## 2024-12-21 LAB — BLOOD GAS, VENOUS

## 2024-12-21 MED ORDER — LACTATED RINGERS IV SOLN: INTRAVENOUS | Status: AC

## 2024-12-21 MED ORDER — PANTOPRAZOLE SODIUM 40 MG PO TBEC
40.0000 mg | DELAYED_RELEASE_TABLET | Freq: Two times a day (BID) | ORAL | Status: DC
  Administered 2024-12-21 – 2024-12-24 (×6): 40 mg via ORAL
  Filled 2024-12-21 (×6): qty 1

## 2024-12-21 MED ORDER — SENNOSIDES-DOCUSATE SODIUM 8.6-50 MG PO TABS: 1.0000 | ORAL_TABLET | Freq: Every evening | ORAL | Status: DC | PRN

## 2024-12-21 MED ORDER — CELECOXIB 200 MG PO CAPS
200.0000 mg | ORAL_CAPSULE | Freq: Two times a day (BID) | ORAL | Status: DC
  Administered 2024-12-21 – 2024-12-24 (×6): 200 mg via ORAL
  Filled 2024-12-21 (×6): qty 1

## 2024-12-21 MED ORDER — BUDESONIDE 0.5 MG/2ML IN SUSP
0.5000 mg | Freq: Two times a day (BID) | RESPIRATORY_TRACT | Status: DC
  Administered 2024-12-21 – 2024-12-24 (×6): 0.5 mg via RESPIRATORY_TRACT
  Filled 2024-12-21 (×6): qty 2

## 2024-12-21 MED ORDER — ZOLPIDEM TARTRATE 5 MG PO TABS
5.0000 mg | ORAL_TABLET | Freq: Every day | ORAL | Status: DC
  Administered 2024-12-21: 2.5 mg via ORAL
  Filled 2024-12-21 (×2): qty 1

## 2024-12-21 MED ORDER — SODIUM CHLORIDE 0.9 % IV BOLUS
1000.0000 mL | Freq: Once | INTRAVENOUS | Status: AC
  Administered 2024-12-21: 1000 mL via INTRAVENOUS

## 2024-12-21 MED ORDER — IPRATROPIUM-ALBUTEROL 0.5-2.5 (3) MG/3ML IN SOLN
6.0000 mL | Freq: Once | RESPIRATORY_TRACT | Status: AC
  Administered 2024-12-21: 6 mL via RESPIRATORY_TRACT
  Filled 2024-12-21: qty 6

## 2024-12-21 MED ORDER — PREDNISONE 20 MG PO TABS
40.0000 mg | ORAL_TABLET | Freq: Every day | ORAL | Status: DC
  Administered 2024-12-22 – 2024-12-23 (×2): 40 mg via ORAL
  Filled 2024-12-21 (×2): qty 2

## 2024-12-21 MED ORDER — ACETAMINOPHEN 325 MG RE SUPP: 650.0000 mg | Freq: Four times a day (QID) | RECTAL | Status: DC | PRN

## 2024-12-21 MED ORDER — GABAPENTIN 300 MG PO CAPS
1200.0000 mg | ORAL_CAPSULE | Freq: Three times a day (TID) | ORAL | Status: DC
  Administered 2024-12-21 – 2024-12-24 (×8): 1200 mg via ORAL
  Filled 2024-12-21 (×8): qty 4

## 2024-12-21 MED ORDER — SODIUM CHLORIDE 0.9 % IV SOLN
2.0000 g | INTRAVENOUS | Status: DC
  Administered 2024-12-21 – 2024-12-23 (×3): 2 g via INTRAVENOUS
  Filled 2024-12-21 (×4): qty 20

## 2024-12-21 MED ORDER — METHYLPREDNISOLONE SODIUM SUCC 125 MG IJ SOLR
125.0000 mg | Freq: Once | INTRAMUSCULAR | Status: AC
  Administered 2024-12-21: 125 mg via INTRAVENOUS
  Filled 2024-12-21: qty 2

## 2024-12-21 MED ORDER — SIMVASTATIN 20 MG PO TABS
40.0000 mg | ORAL_TABLET | Freq: Every evening | ORAL | Status: DC
  Administered 2024-12-21 – 2024-12-23 (×3): 40 mg via ORAL
  Filled 2024-12-21: qty 4
  Filled 2024-12-21 (×2): qty 2

## 2024-12-21 MED ORDER — IPRATROPIUM-ALBUTEROL 0.5-2.5 (3) MG/3ML IN SOLN
3.0000 mL | Freq: Four times a day (QID) | RESPIRATORY_TRACT | Status: DC
  Administered 2024-12-21 – 2024-12-23 (×6): 3 mL via RESPIRATORY_TRACT
  Filled 2024-12-21 (×6): qty 3

## 2024-12-21 MED ORDER — SODIUM CHLORIDE 0.9 % IV SOLN
500.0000 mg | INTRAVENOUS | Status: DC
  Administered 2024-12-21 – 2024-12-22 (×2): 500 mg via INTRAVENOUS
  Filled 2024-12-21 (×3): qty 5

## 2024-12-21 MED ORDER — AMITRIPTYLINE HCL 50 MG PO TABS
50.0000 mg | ORAL_TABLET | Freq: Every day | ORAL | Status: DC
  Administered 2024-12-21 – 2024-12-23 (×3): 50 mg via ORAL
  Filled 2024-12-21 (×3): qty 1

## 2024-12-21 MED ORDER — SODIUM CHLORIDE 0.9 % IV SOLN
2.0000 g | Freq: Once | INTRAVENOUS | Status: AC
  Administered 2024-12-21: 2 g via INTRAVENOUS
  Filled 2024-12-21: qty 12.5

## 2024-12-21 MED ORDER — ONDANSETRON HCL 4 MG/2ML IJ SOLN: 4.0000 mg | Freq: Four times a day (QID) | INTRAMUSCULAR | Status: DC | PRN

## 2024-12-21 MED ORDER — ACETAMINOPHEN 325 MG PO TABS: 650.0000 mg | ORAL_TABLET | Freq: Four times a day (QID) | ORAL | Status: DC | PRN

## 2024-12-21 MED ORDER — HEPARIN SODIUM (PORCINE) 5000 UNIT/ML IJ SOLN
5000.0000 [IU] | Freq: Three times a day (TID) | INTRAMUSCULAR | Status: DC
  Administered 2024-12-21 – 2024-12-24 (×8): 5000 [IU] via SUBCUTANEOUS
  Filled 2024-12-21 (×9): qty 1

## 2024-12-21 MED ORDER — VANCOMYCIN HCL 1250 MG/250ML IV SOLN
1250.0000 mg | Freq: Once | INTRAVENOUS | Status: AC
  Administered 2024-12-21: 1250 mg via INTRAVENOUS
  Filled 2024-12-21: qty 250

## 2024-12-21 MED ORDER — VITAMIN D 25 MCG (1000 UNIT) PO TABS
1000.0000 [IU] | ORAL_TABLET | Freq: Every day | ORAL | Status: DC
  Administered 2024-12-21 – 2024-12-24 (×4): 1000 [IU] via ORAL
  Filled 2024-12-21 (×4): qty 1

## 2024-12-21 MED ORDER — SODIUM CHLORIDE 0.9% FLUSH
3.0000 mL | Freq: Two times a day (BID) | INTRAVENOUS | Status: DC
  Administered 2024-12-22 – 2024-12-24 (×4): 3 mL via INTRAVENOUS

## 2024-12-21 MED ORDER — LABETALOL HCL 5 MG/ML IV SOLN: 10.0000 mg | INTRAVENOUS | Status: DC | PRN

## 2024-12-21 MED ORDER — HYDROCODONE-ACETAMINOPHEN 10-325 MG PO TABS
1.0000 | ORAL_TABLET | Freq: Four times a day (QID) | ORAL | Status: DC | PRN
  Administered 2024-12-21 – 2024-12-23 (×4): 1 via ORAL
  Filled 2024-12-21 (×4): qty 1

## 2024-12-21 MED ORDER — ASPIRIN 81 MG PO CHEW
81.0000 mg | CHEWABLE_TABLET | Freq: Every day | ORAL | Status: DC
  Administered 2024-12-21 – 2024-12-24 (×4): 81 mg via ORAL
  Filled 2024-12-21 (×4): qty 1

## 2024-12-21 MED ORDER — SERTRALINE HCL 50 MG PO TABS
25.0000 mg | ORAL_TABLET | Freq: Every day | ORAL | Status: DC
  Administered 2024-12-21 – 2024-12-24 (×4): 25 mg via ORAL
  Filled 2024-12-21 (×4): qty 1

## 2024-12-21 MED ORDER — ONDANSETRON HCL 4 MG PO TABS: 4.0000 mg | ORAL_TABLET | Freq: Four times a day (QID) | ORAL | Status: DC | PRN

## 2024-12-21 NOTE — H&P (Addendum)
 " History and Physical    Alexandra [name] FMW:[id number] DOB: 1941-03-27 DOA: 12/21/2024  DOS: the patient was seen and examined on 12/21/2024  PCP: Derick Leita POUR, MD   Patient coming from: Home  I have personally briefly reviewed patient's old medical records in [facility] and CareEverywhere  HPI:   Alexandra [name] is a 84 y.o. year old female with medical history of chronic hypoxic respiratory failure secondary to COPD on 2 L, history of lung cancer status post SBRT, hyperlipidemia, opioid use disorder, GAD and MDD presenting to the ED with worsening shortness of breath. She states she has been having worsening cough with green sputum production. This has been going on for about 2 weeks. On ROS she states she has been having diarrhea since starting antibiotics.  She was seen by her doctor and told she had pneumonia and was started on antibiotics which she has been taking.  She was started on cefpodoxime and doxycycline. On arrival to the ED patient was noted to be HDS stable.  Lab work and imaging obtained.  CBC with significant leukocytosis at 17.3, mild anemia near baseline.  BMP with elevated bicarb but otherwise normal renal function.  Troponin mildly elevated with repeat pending.  Respiratory panel negative for COVID, flu, RSV.  Lactic acid normal.  Chest x-ray concerning for bibasilar infiltrates versus atelectasis.  Blood cultures obtained and patient started on IV antibiotics.  Given need for continued care, [facility] contacted for admission.  Review of Systems: As mentioned in the history of present illness. All other systems reviewed and are negative.   Past Medical History:  Diagnosis Date   Anxiety    COPD (chronic obstructive pulmonary disease) (HCC)    Depression    GERD (gastroesophageal reflux disease)    Hyperlipidemia    Pulmonary nodules/lesions, multiple    Right femoral fracture [facility])     Past Surgical History:  Procedure Laterality Date   ABDOMINAL HYSTERECTOMY  1975    COLONOSCOPY     COLOSTOMY  2012   repaired and reduced in 2013   intestinal rupture  2012   INTRAMEDULLARY (IM) NAIL INTERTROCHANTERIC Right 12/17/2021   Procedure: INTRAMEDULLARY (IM) NAIL INTERTROCHANTRIC;  Surgeon: Rollene Cough, MD;  Location: [facility] ORS;  Service: Orthopedics;  Laterality: Right;   TAKE DOWN OF INTESTINAL FISTULA  2013   TUMOR EXCISION  2013   hospitalized and drained.    VIDEO BRONCHOSCOPY WITH ENDOBRONCHIAL ULTRASOUND N/A 03/19/2023   Procedure: VIDEO BRONCHOSCOPY WITH ENDOBRONCHIAL ULTRASOUND;  Surgeon: Parris Manna, MD;  Location: [facility] ORS;  Service: Thoracic;  Laterality: N/A;     Allergies[1]  Family History  Problem Relation Age of Onset   Heart disease Mother    Heart disease Father     Prior to Admission medications  Medication Sig Start Date End Date Taking? Authorizing Provider  albuterol  (PROVENTIL ) (2.5 MG/3ML) 0.083% nebulizer solution Take 3 mLs (2.5 mg total) by nebulization every 6 (six) hours as needed for Wheezing 10/22/23 10/24/24  [provider]  albuterol  (VENTOLIN  HFA) 108 (90 Base) MCG/ACT inhaler Inhale 1-2 puffs into the lungs every 6 (six) hours as needed for wheezing or shortness of breath.    [provider]  amitriptyline  (ELAVIL ) 50 MG tablet Take 50 mg by mouth at bedtime.    [provider]  aspirin  81 MG chewable tablet Chew 81 mg by mouth daily.    [provider]  benzonatate (TESSALON) 200 MG capsule Take 200 mg by mouth 3 (  three) times daily as needed for cough. 10/16/23   [provider]  Budeson-Glycopyrrol-Formoterol (BREZTRI AEROSPHERE) 160-9-4.8 MCG/ACT AERO Inhale 1 puff into the lungs 2 (two) times daily.    [provider]  celecoxib  (CELEBREX ) 200 MG capsule Take 200 mg by mouth 2 (two) times daily.    [provider]  cholecalciferol  (VITAMIN D3) 25 MCG (1000 UNIT) tablet Take 1,000 Units by mouth daily.    [provider]  CYCLOBENZAPRINE HCL  PO Take 1 tablet by mouth as needed.    [provider]  diazepam  (VALIUM ) 5 MG tablet Take 1 tablet (5 mg) 30-60 minutes prior to Imaging. 07/17/24   Dasie Tinnie MATSU, NP  gabapentin  (NEURONTIN ) 600 MG tablet Take 1,200 mg by mouth 3 (three) times daily.    [provider]  HYDROcodone -acetaminophen  (NORCO) 10-325 MG tablet Take 1 tablet by mouth 4 (four) times daily as needed for moderate pain (pain score 4-6) or severe pain (pain score 7-10).    [provider]  latanoprost  (XALATAN ) 0.005 % ophthalmic solution Place 1 drop into both eyes at bedtime.    [provider]  OXYGEN Inhale 2 L into the lungs continuous.    [provider]  pantoprazole  (PROTONIX ) 40 MG tablet Take 40 mg by mouth 2 (two) times daily.    [provider]  simvastatin  (ZOCOR ) 40 MG tablet Take 40 mg by mouth every evening.    [provider]  Alexandra [name] 200-62.5-25 MCG/ACT AEPB Inhale 1 puff into the lungs daily. 09/18/23   [provider]  zolpidem  (AMBIEN ) 10 MG tablet Take 0.5 tablets (5 mg total) by mouth at bedtime. 12/24/21   Arrien, Elidia Sieving, MD    Social History:  reports that she quit smoking about 14 years ago. Her smoking use included cigarettes. She started smoking about 51 years ago. She has a 37 pack-year smoking history. She has never used smokeless tobacco. She reports that she does not drink alcohol and does not use drugs.    Physical Exam: Vitals:   12/21/24 1330 12/21/24 1331 12/21/24 1615 12/21/24 1753  BP:  (!) 150/84 (!) 166/86   Pulse:  (!) 127 (!) 121   Resp:  (!) 22 20   Temp:  98.2 F (36.8 C)  98.2 F (36.8 C)  SpO2:  97% 99%   Weight: 59 kg     Height: 5' 9 (1.753 m)       Gen: NAD HENT: NCAT, nasal cannula present CV: normal heart sounds Lung: Wheezing present Abd: No TTP, normal bowel sounds MSK: No asymmetry, good bulk and tone Neuro: alert and oriented   Labs on Admission: I have personally  reviewed following labs and imaging studies  CBC: Recent Labs  Lab 12/21/24 1331  WBC 17.3*  HGB 8.8*  HCT 29.6*  MCV 90.8  PLT 378   Basic Metabolic Panel: Recent Labs  Lab 12/21/24 1331 12/21/24 1713  NA 140  --   K 4.2  --   CL 95*  --   CO2 35*  --   GLUCOSE 114*  --   BUN 9  --   CREATININE 0.64  --   CALCIUM 9.4  --   MG  --  1.9   GFR: Estimated Creatinine Clearance: 50.5 mL/min (by C-G formula based on SCr of 0.64 mg/dL). Liver Function Tests: No results for input(s): AST, ALT, ALKPHOS, BILITOT, PROT, ALBUMIN in the last 168 hours. No results for input(s): LIPASE, AMYLASE in  the last 168 hours. No results for input(s): AMMONIA in the last 168 hours. Coagulation Profile: No results for input(s): INR, PROTIME in the last 168 hours. Cardiac Enzymes: No results for input(s): CKTOTAL, CKMB, CKMBINDEX, TROPONINI, TROPONINIHS in the last 168 hours. BNP (last 3 results) No results for input(s): BNP in the last 8760 hours. HbA1C: No results for input(s): HGBA1C in the last 72 hours. CBG: No results for input(s): GLUCAP in the last 168 hours. Lipid Profile: No results for input(s): CHOL, HDL, LDLCALC, TRIG, CHOLHDL, LDLDIRECT in the last 72 hours. Thyroid  Function Tests: No results for input(s): TSH, T4TOTAL, FREET4, T3FREE, THYROIDAB in the last 72 hours. Anemia Panel: No results for input(s): VITAMINB12, FOLATE, FERRITIN, TIBC, IRON, RETICCTPCT in the last 72 hours. Urine analysis:    Component Value Date/Time   COLORURINE YELLOW (A) 10/27/2023 0304   APPEARANCEUR HAZY (A) 10/27/2023 0304   APPEARANCEUR Clear 01/20/2012 1203   LABSPEC 1.019 10/27/2023 0304   LABSPEC 1.004 01/20/2012 1203   PHURINE 7.0 10/27/2023 0304   GLUCOSEU NEGATIVE 10/27/2023 0304   GLUCOSEU Negative 01/20/2012 1203   HGBUR NEGATIVE 10/27/2023 0304   BILIRUBINUR NEGATIVE 10/27/2023 0304   BILIRUBINUR Negative  01/20/2012 1203   KETONESUR NEGATIVE 10/27/2023 0304   PROTEINUR NEGATIVE 10/27/2023 0304   NITRITE NEGATIVE 10/27/2023 0304   LEUKOCYTESUR SMALL (A) 10/27/2023 0304   LEUKOCYTESUR Negative 01/20/2012 1203    Radiological Exams on Admission: I have personally reviewed images DG Chest 2 View Result Date: 12/21/2024 EXAM: 2 VIEW(S) XRAY OF THE CHEST 12/21/2024 01:45:00 PM COMPARISON: 10/27/2023 CLINICAL HISTORY: sob FINDINGS: LUNGS AND PLEURA: Biapical scarring. Right apical mass/post-radiation change. Increased bibasilar atelectasis or infiltrates . Trace bilateral pleural effusions. No pneumothorax. HEART AND MEDIASTINUM: No acute abnormality of the cardiac and mediastinal silhouettes. BONES AND SOFT TISSUES: No acute osseous abnormality. IMPRESSION: 1. Increased bibasilar atelectasis or infiltrates with trace bilateral pleural effusions. 2. Right apical mass/post-radiation change. Electronically signed by: Norman Gatlin MD 12/21/2024 01:58 PM EST RP Workstation: [id number]    EKG: My personal interpretation of EKG shows: Sinus rhythm without any acute ST changes.  Premature beats present.    Assessment/Plan Principal Problem:   Sepsis due to pneumonia [facility]) Active Problems:   COPD with acute exacerbation (HCC)   Chronic obstructive pulmonary disease (COPD) (HCC)   Opioid dependence (HCC)   Chronic hypoxic respiratory failure (HCC)   Malignant neoplasm of upper lobe of right lung (HCC)   Anxiety and depression   GERD (gastroesophageal reflux disease)   Hyperlipidemia   Elevated blood pressure reading   Patient with acute hypoxic respiratory failure secondary to pneumonia.  Patient meets sepsis criteria given SIRS, source of infection and endorgan dysfunction.  She is on 2 L but currently on 4 L.  Patient status post cefepime  and vancomycin  in the ED.  Will change him to CAP coverage with ceftriaxone  and azithromycin .  VBG is reassuring.  Will try to wean oxygen as able.  Follow-up  cultures.  Monitor leukocyte count and trend fever curve.  Pt with cardinal symptoms of COPD exacerbation likely secondary to pneumonia. Pt status post nebulizers, IV magnesium, and Solumedrol.  - Start prednisone  40mg  for 5 day course - Azithromycin  500mg  x3 doses - DuoNebs q6hr   Sinus tachycardia: Secondary to primary problem.  Getting treatment with IV antibiotics and IVF.  PE considered but unlikely given more likely summation of the pneumonia and no chest pain  Left lower extremity swelling:  will get Doppler, if positive for DVT  then we will consider PE explain her sinus tachycardia.  GAD/MDD: Continue home med, consider discontinuing elavil  on outpatient setting given pt's age.   Hyperlipidemia: Continue home med  GERD: Continue home med   Elevated blood pressure: Multiple previous elevated blood pressures noted on chart review.  No diagnosis of blood pressure and not on any pharmacotherapy.  Will need outpatient monitoring for this as is in acute setting.  Chronic pain syndrome: Continue home regimen including opioid medication.  History of lung cancer: Status post SBRT in April 2024.  Follows with Dr. Melanee.  Last visit was 10/24/2024 with recommendation to follow-up in 6 months.  During the follow-up he was noted there was slight increase in 1 nodule but overall stable findings with repeat imaging recommended in 6 months.   VTE prophylaxis:  SQ Heparin   Diet: Regular Code Status:  Full Code Telemetry:  Admission status: Observation, Progressive Patient is from: Home Anticipated d/c is to: Home Anticipated d/c is in: 1-2 days   Family Communication: Updated at bedside  Consults called: None   Severity of Illness: The appropriate patient status for this patient is OBSERVATION. Observation status is judged to be reasonable and necessary in order to provide the required intensity of service to ensure the patient's safety. The patient's presenting symptoms, physical exam  findings, and initial radiographic and laboratory data in the context of their medical condition is felt to place them at decreased risk for further clinical deterioration. Furthermore, it is anticipated that the patient will be medically stable for discharge from the hospital within 2 midnights of admission.    Morene Bathe, MD [facility] DEL. [facility]     [1]  Allergies Allergen Reactions   Avelox [Moxifloxacin Hcl] Shortness Of Breath and Swelling   Moxifloxacin Anaphylaxis   Sulfa Antibiotics Anaphylaxis   Cefadroxil Itching   Elemental Sulfur Other (See Comments)    Reaction as a child and not sure what.    "

## 2024-12-21 NOTE — ED Provider Notes (Signed)
 "  [facility] Provider Note    Event Date/Time   First MD Initiated Contact with Patient 12/21/24 1531     (approximate)   History   Shortness of Breath   HPI  Alexandra [name] is a 84 y.o. female who presents to the ED for evaluation of Shortness of Breath   Review oncology clinic visit from 2 months ago.  Extensive smoking history, COPD on chronic 2 L.  Pulmonary adenocarcinoma s/p radiation , now on surveillance.  Patient presents to the ED alongside her daughter for evaluation of worsening shortness of breath over the past few days.  She was placed on antibiotics by her PCP on Thursday, 4 days ago.  Cefpodoxime and doxycycline.  Despite adherence to these medications she has continued shortness of breath, malaise, subjective fevers and chills and hypoxia by finger pulse oximeter.  She has increased her supplemental O2 at home to 4 L with minimal improvement of her dyspnea.   Physical Exam   Triage Vital Signs: ED Triage Vitals  Encounter Vitals Group     BP 12/21/24 1331 (!) 150/84     Girls Systolic BP Percentile --      Girls Diastolic BP Percentile --      Boys Systolic BP Percentile --      Boys Diastolic BP Percentile --      Pulse Rate 12/21/24 1331 (!) 127     Resp 12/21/24 1331 (!) 22     Temp 12/21/24 1331 98.2 F (36.8 C)     Temp src --      SpO2 12/21/24 1331 97 %     Weight 12/21/24 1330 130 lb (59 kg)     Height 12/21/24 1330 5' 9 (1.753 m)     Head Circumference --      Peak Flow --      Pain Score 12/21/24 1330 3     Pain Loc --      Pain Education --      Exclude from Growth Chart --     Most recent vital signs: Vitals:   12/21/24 1331 12/21/24 1615  BP: (!) 150/84 (!) 166/86  Pulse: (!) 127 (!) 121  Resp: (!) 22 20  Temp: 98.2 F (36.8 C)   SpO2: 97% 99%    General: Awake, no distress.  Appears uncomfortable CV:  Good peripheral perfusion.  Tachycardic and regular Resp:  Tachypneic and dyspneic on nasal cannula,  no tripoding or distress.  Wheezing throughout and decreased airflow Abd:  No distention.  MSK:  No deformity noted.  No peripheral edema Neuro:  No focal deficits appreciated. Other:     ED Results / Procedures / Treatments   Labs (all labs ordered are listed, but only abnormal results are displayed) Labs Reviewed  BASIC METABOLIC PANEL WITH GFR - Abnormal; Notable for the following components:      Result Value   Chloride 95 (*)    CO2 35 (*)    Glucose, Bld 114 (*)    All other components within normal limits  CBC - Abnormal; Notable for the following components:   WBC 17.3 (*)    RBC 3.26 (*)    Hemoglobin 8.8 (*)    HCT 29.6 (*)    MCHC 29.7 (*)    All other components within normal limits  TROPONIN T, HIGH SENSITIVITY - Abnormal; Notable for the following components:   Troponin T High Sensitivity 20 (*)    All other components within  normal limits  RESP PANEL BY RT-PCR (RSV, FLU A&B, COVID)  [id number]  CULTURE, BLOOD (ROUTINE X 2)  CULTURE, BLOOD (ROUTINE X 2)  LACTIC ACID, PLASMA  PROCALCITONIN    EKG Sinus tachycardia rate 123 bpm.  Normal axis and intervals.  No overt signs of acute ischemia.  RADIOLOGY 2 view CXR interpreted by me with bibasilar opacities.  Official radiology report(s): DG Chest 2 View Result Date: 12/21/2024 EXAM: 2 VIEW(S) XRAY OF THE CHEST 12/21/2024 01:45:00 PM COMPARISON: 10/27/2023 CLINICAL HISTORY: sob FINDINGS: LUNGS AND PLEURA: Biapical scarring. Right apical mass/post-radiation change. Increased bibasilar atelectasis or infiltrates . Trace bilateral pleural effusions. No pneumothorax. HEART AND MEDIASTINUM: No acute abnormality of the cardiac and mediastinal silhouettes. BONES AND SOFT TISSUES: No acute osseous abnormality. IMPRESSION: 1. Increased bibasilar atelectasis or infiltrates with trace bilateral pleural effusions. 2. Right apical mass/post-radiation change. Electronically signed by: Norman Gatlin MD 12/21/2024 01:58 PM EST RP  Workstation: [id number]    PROCEDURES and INTERVENTIONS:  .Critical Care  Performed by: Claudene Rover, MD Authorized by: Claudene Rover, MD   Critical care provider statement:    Critical care time (minutes):  30   Critical care time was exclusive of:  Separately billable procedures and treating other patients   Critical care was necessary to treat or prevent imminent or life-threatening deterioration of the following conditions:  Respiratory failure and sepsis   Critical care was time spent personally by me on the following activities:  Development of treatment plan with patient or surrogate, discussions with consultants, evaluation of patient's response to treatment, examination of patient, ordering and review of laboratory studies, ordering and review of radiographic studies, ordering and performing treatments and interventions, pulse oximetry, re-evaluation of patient's condition and review of old charts .1-3 Lead EKG Interpretation  Performed by: Claudene Rover, MD Authorized by: Claudene Rover, MD     Interpretation: abnormal     ECG rate:  124   ECG rate assessment: tachycardic     Rhythm: sinus tachycardia     Ectopy: none     Conduction: normal     Medications  vancomycin  (VANCOREADY) IVPB 1250 mg/250 mL (1,250 mg Intravenous New Bag/Given 12/21/24 1629)  ipratropium-albuterol  (DUONEB) 0.5-2.5 (3) MG/3ML nebulizer solution 6 mL (6 mLs Nebulization Given 12/21/24 1619)  methylPREDNISolone  sodium succinate (SOLU-MEDROL ) 125 mg/2 mL injection 125 mg (125 mg Intravenous Given 12/21/24 1619)  ceFEPIme  (MAXIPIME ) 2 g in sodium chloride  0.9 % 100 mL IVPB (2 g Intravenous New Bag/Given 12/21/24 1624)  sodium chloride  0.9 % bolus 1,000 mL (1,000 mLs Intravenous New Bag/Given 12/21/24 1621)     IMPRESSION / MDM / ASSESSMENT AND PLAN / ED COURSE  I reviewed the triage vital signs and the nursing notes.  Differential diagnosis includes, but is not limited to, ACS, PTX, PNA, muscle strain/spasm, PE,  dissection, anxiety, pleural effusion  {Patient presents with symptoms of an acute illness or injury that is potentially life-threatening.  Patient presents with signs of COPD exacerbation and failure of outpatient management with recently diagnosed pneumonia.  Is meeting SIRS/sepsis criteria with tachycardia, tachypnea and leukocytosis.  Hemodynamically stable without signs of shock.  Requiring 4 L nasal cannula.  Chronic anemia without bleeding symptoms or indication for transfusion.  No significant metabolic derangements, normal lactic acid.  Start on steroids, provide DuoNebs, broaden antibiotics and draw cultures.  Clinical Course as of 12/21/24 1708  [place] Dec 21, 2024  1655 Reassessed, some improvement, louder wheezing with improved airflow on pulmonary auscultation.  Discussed plan of care  with patient and daughter [DS]  66 I consult with medicine who agrees to admit [DS]    Clinical Course User Index [DS] Claudene Rover, MD     FINAL CLINICAL IMPRESSION(S) / ED DIAGNOSES   Final diagnoses:  COPD exacerbation (HCC)  Community acquired pneumonia, unspecified laterality  Failure of outpatient treatment     Rx / DC Orders   ED Discharge Orders     None        Note:  This document was prepared using Dragon voice recognition software and may include unintentional dictation errors.   Claudene Rover, MD 12/21/24 1709  "

## 2024-12-21 NOTE — ED Triage Notes (Signed)
 Pt comes via EMS from home with sob. Pt states this has been going on since Christmas but now worse. Pt wears 4L at home and has hx of copd. Pt states recently dx with pna and on meds for it. Pt has junky cough present.

## 2024-12-21 NOTE — ED Triage Notes (Signed)
 First Nurse Note:  Pt via [facility] from home. Pt c/o SOB and worsening dyspnea since Christmas. Worse with exertion. Pt wear 4L [place] chronically. Pt is A&Ox4 and NAD. Pt has hx of COPD and cancer pt, recently dx with PNA and currently on abx. EMS reports:  98.1 oral  120 ST  100% on 4L  20G L wrist

## 2024-12-22 ENCOUNTER — Inpatient Hospital Stay

## 2024-12-22 DIAGNOSIS — J441 Chronic obstructive pulmonary disease with (acute) exacerbation: Secondary | ICD-10-CM | POA: Diagnosis present

## 2024-12-22 DIAGNOSIS — G894 Chronic pain syndrome: Secondary | ICD-10-CM | POA: Diagnosis present

## 2024-12-22 DIAGNOSIS — I10 Essential (primary) hypertension: Secondary | ICD-10-CM | POA: Diagnosis present

## 2024-12-22 DIAGNOSIS — Z8249 Family history of ischemic heart disease and other diseases of the circulatory system: Secondary | ICD-10-CM | POA: Diagnosis not present

## 2024-12-22 DIAGNOSIS — F329 Major depressive disorder, single episode, unspecified: Secondary | ICD-10-CM | POA: Diagnosis present

## 2024-12-22 DIAGNOSIS — A419 Sepsis, unspecified organism: Secondary | ICD-10-CM

## 2024-12-22 DIAGNOSIS — F411 Generalized anxiety disorder: Secondary | ICD-10-CM | POA: Diagnosis present

## 2024-12-22 DIAGNOSIS — Z7982 Long term (current) use of aspirin: Secondary | ICD-10-CM | POA: Diagnosis not present

## 2024-12-22 DIAGNOSIS — F1120 Opioid dependence, uncomplicated: Secondary | ICD-10-CM | POA: Diagnosis present

## 2024-12-22 DIAGNOSIS — R5381 Other malaise: Secondary | ICD-10-CM | POA: Diagnosis present

## 2024-12-22 DIAGNOSIS — Z923 Personal history of irradiation: Secondary | ICD-10-CM | POA: Diagnosis not present

## 2024-12-22 DIAGNOSIS — J189 Pneumonia, unspecified organism: Secondary | ICD-10-CM | POA: Diagnosis not present

## 2024-12-22 DIAGNOSIS — K219 Gastro-esophageal reflux disease without esophagitis: Secondary | ICD-10-CM | POA: Diagnosis present

## 2024-12-22 DIAGNOSIS — K521 Toxic gastroenteritis and colitis: Secondary | ICD-10-CM | POA: Diagnosis present

## 2024-12-22 DIAGNOSIS — E785 Hyperlipidemia, unspecified: Secondary | ICD-10-CM | POA: Diagnosis present

## 2024-12-22 DIAGNOSIS — Z90710 Acquired absence of both cervix and uterus: Secondary | ICD-10-CM | POA: Diagnosis not present

## 2024-12-22 DIAGNOSIS — E876 Hypokalemia: Secondary | ICD-10-CM | POA: Diagnosis not present

## 2024-12-22 DIAGNOSIS — Z933 Colostomy status: Secondary | ICD-10-CM | POA: Diagnosis not present

## 2024-12-22 DIAGNOSIS — J440 Chronic obstructive pulmonary disease with acute lower respiratory infection: Secondary | ICD-10-CM | POA: Diagnosis present

## 2024-12-22 DIAGNOSIS — J9621 Acute and chronic respiratory failure with hypoxia: Secondary | ICD-10-CM | POA: Diagnosis present

## 2024-12-22 DIAGNOSIS — Z9981 Dependence on supplemental oxygen: Secondary | ICD-10-CM | POA: Diagnosis not present

## 2024-12-22 DIAGNOSIS — Z87891 Personal history of nicotine dependence: Secondary | ICD-10-CM | POA: Diagnosis not present

## 2024-12-22 DIAGNOSIS — D649 Anemia, unspecified: Secondary | ICD-10-CM | POA: Diagnosis present

## 2024-12-22 DIAGNOSIS — Z1152 Encounter for screening for COVID-19: Secondary | ICD-10-CM | POA: Diagnosis not present

## 2024-12-22 LAB — CBC
HCT: 28 % — ABNORMAL LOW (ref 36.0–46.0)
Hemoglobin: 8.3 g/dL — ABNORMAL LOW (ref 12.0–15.0)
MCH: 26.8 pg (ref 26.0–34.0)
MCHC: 29.6 g/dL — ABNORMAL LOW (ref 30.0–36.0)
MCV: 90.3 fL (ref 80.0–100.0)
Platelets: 362 K/uL (ref 150–400)
RBC: 3.1 MIL/uL — ABNORMAL LOW (ref 3.87–5.11)
RDW: 14.2 % (ref 11.5–15.5)
WBC: 10.6 K/uL — ABNORMAL HIGH (ref 4.0–10.5)
nRBC: 0 % (ref 0.0–0.2)

## 2024-12-22 LAB — BASIC METABOLIC PANEL WITH GFR
Anion gap: 10 (ref 5–15)
BUN: 12 mg/dL (ref 8–23)
CO2: 34 mmol/L — ABNORMAL HIGH (ref 22–32)
Calcium: 9.7 mg/dL (ref 8.9–10.3)
Chloride: 97 mmol/L — ABNORMAL LOW (ref 98–111)
Creatinine, Ser: 0.64 mg/dL (ref 0.44–1.00)
GFR, Estimated: >60 mL/min
Glucose, Bld: 142 mg/dL — ABNORMAL HIGH (ref 70–99)
Potassium: 3.9 mmol/L (ref 3.5–5.1)
Sodium: 140 mmol/L (ref 135–145)

## 2024-12-22 LAB — PHOSPHORUS: Phosphorus: 3.6 mg/dL (ref 2.5–4.6)

## 2024-12-22 LAB — CBG MONITORING, ED: Glucose-Capillary: 127 mg/dL — ABNORMAL HIGH (ref 70–99)

## 2024-12-22 LAB — MAGNESIUM: Magnesium: 1.9 mg/dL (ref 1.7–2.4)

## 2024-12-22 MED ORDER — LOPERAMIDE HCL 2 MG PO CAPS
2.0000 mg | ORAL_CAPSULE | ORAL | Status: DC | PRN
  Administered 2024-12-22 – 2024-12-24 (×3): 2 mg via ORAL
  Filled 2024-12-22 (×3): qty 1

## 2024-12-22 MED ORDER — HYDRALAZINE HCL 25 MG PO TABS: 25.0000 mg | ORAL_TABLET | Freq: Three times a day (TID) | ORAL | Status: DC | PRN

## 2024-12-22 NOTE — ED Notes (Signed)
 This NT assisted patient off of bedside commode. Patient is now back in bed and has no further needs as this time.

## 2024-12-22 NOTE — TOC Initial Note (Signed)
 Transition of Care [facility]) - Initial/Assessment Note    Patient Details  Name: Alexandra [name] MRN: [id number] Date of Birth: 08-02-1941  Transition of Care [facility]) CM/SW Contact:    Alexandra DELENA Daring, RN Phone Number: 12/22/2024, 4:19 PM  Clinical Narrative:                 RNCM reviewed chart and spoke with daughter, Alexandra [name], on phone. Information gathered from daughter. Patient has PCP. Patient no longer drives, but her daughter takes her to her appointments. No current home health services but is agreeable to starting. DME in home includes walker, cane, bedside commode and shower chair.  [facility] [place] [facility].   RNCM will initiate HH search   Expected Discharge Plan: Home w Home Health Services Barriers to Discharge: Continued Medical Work up   Patient Goals and CMS Choice            Expected Discharge Plan and Services   Discharge Planning Services: CM Consult                                          Prior Living Arrangements/Services     Patient language and need for interpreter reviewed:: Yes Do you feel safe going back to the place where you live?: Yes      Need for Family Participation in Patient Care: Yes (Comment) Care giver support system in place?: Yes (comment)   Criminal Activity/Legal Involvement Pertinent to Current Situation/Hospitalization: No - Comment as needed  Activities of Daily Living   ADL Screening (condition at time of admission) Independently performs ADLs?: Yes (appropriate for developmental age) Is the patient deaf or have difficulty hearing?: No Does the patient have difficulty seeing, even when wearing glasses/contacts?: No Does the patient have difficulty concentrating, remembering, or making decisions?: No  Permission Sought/Granted Permission sought to share information with : Case Manager, Magazine Features Editor Permission granted to share information with : Yes, Verbal Permission Granted               Emotional Assessment       Orientation: : Oriented to Self, Oriented to Place, Oriented to  Time, Oriented to Situation Alcohol / Substance Use: Not Applicable Psych Involvement: No (comment)  Admission diagnosis:  COPD exacerbation (HCC) [J44.1] Failure of outpatient treatment [Z78.9] Sepsis due to pneumonia (HCC) [J18.9, A41.9] Community acquired pneumonia, unspecified laterality [J18.9] Patient Active Problem List   Diagnosis Date Noted   Elevated blood pressure reading 12/21/2024   COPD with acute exacerbation (HCC) 12/21/2024   Sepsis due to pneumonia (HCC) 10/27/2023   Chronic obstructive pulmonary disease (COPD) (HCC) 10/27/2023   Chronic hypoxic respiratory failure (HCC) 10/27/2023   Chronic pain 10/27/2023   Opioid dependence (HCC) 10/27/2023   Overdose of opiate or related narcotic, accidental or unintentional, initial encounter (HCC) 10/27/2023   Acute toxic-metabolic encephalopathy 10/27/2023   Anemia 10/27/2023   Malignant neoplasm of upper lobe of right lung (HCC) 03/27/2023   Right femoral fracture (HCC) 12/16/2021   Anxiety and depression 12/16/2021   Insomnia 12/16/2021   GERD (gastroesophageal reflux disease) 12/16/2021   Hyperlipidemia 12/16/2021   Polypharmacy 12/16/2021   Pulmonary nodules/lesions, multiple 12/16/2021   Torus palatinus 12/16/2021   Urticaria 12/16/2021   Idiopathic peripheral neuropathy 02/06/2017   S/P hernia repair 10/18/2012   Anxiety 10/16/2012   Neuropathy 10/16/2012   Peptic ulcer 10/16/2012  Perforated sigmoid colon (HCC) 10/16/2012   Rectovaginal fistula 10/16/2012   Ventral hernia 10/16/2012   PCP:  Derick Leita POUR, MD Pharmacy[facility]   [facility] [phone], [place] - [address] [place] [address] Phone: [phone] Fax: [phone]     Social Drivers of Health (SDOH) Social History: SDOH Screenings   Food Insecurity: No Food Insecurity (12/22/2024)  Housing: Low Risk (12/22/2024)   Transportation Needs: No Transportation Needs (12/22/2024)  Utilities: Not At Risk (12/22/2024)  Alcohol Screen: Low Risk (03/27/2023)  Depression (PHQ2-9): Low Risk (07/17/2024)  Financial Resource Strain: Low Risk  (02/19/2024)   Received from [facility]  Physical Activity: Inactive (03/27/2023)  Social Connections: Socially Isolated (12/22/2024)  Stress: No Stress Concern Present (03/27/2023)  Tobacco Use: Medium Risk (12/21/2024)   SDOH Interventions:     Readmission Risk Interventions     No data to display

## 2024-12-22 NOTE — ED Notes (Signed)
 At this time, this EDT answered call light from this pt. This NT took this pt off the [facility] [place], pt was able to provide peri care and ambulated with light assistance back to bed. Pt is now resting, no other needs at this time.

## 2024-12-22 NOTE — Evaluation (Signed)
 Physical Therapy Evaluation Patient Details Name: Alexandra [name] MRN: [id number] DOB: [birth year] Today's Date: 12/22/2024  History of Present Illness  Pt is an 84 y.o. year old female with medical history of chronic hypoxic respiratory failure secondary to COPD on 2 L, history of lung cancer status post SBRT, hyperlipidemia, opioid use disorder, GAD and MDD presenting to the ED with worsening shortness of breath.  MD assessment includes: Sepsis due to pneumonia, COPD exacerbation likely secondary to pneumonia, sinus tachycardia, and LLE swelling.   Clinical Impression  Pt was pleasant but requested limited session due to generally not feeling well with frequent diarrhea. Pt agreeable to limited functional mobility assessment and required no physical assist with bed mobility, transfers, or gait but activity tolerance with ambulation was self-limited to a max of around 8 feet.  Pt found on 2.5LO2/min with SpO2 in the upper 90s at rest and dropping to a low of 88% with activity, HR WNL throughout.  Pt is expected to make good progress towards goals once her medical condition improves and will benefit from continued PT services upon discharge to safely address deficits listed in patient problem list for decreased caregiver assistance and eventual return to PLOF.          If plan is discharge home, recommend the following: A little help with walking and/or transfers;A little help with bathing/dressing/bathroom;Assistance with cooking/housework;Assist for transportation   Can travel by private vehicle        Equipment Recommendations None recommended by PT  Recommendations for Other Services       Functional Status Assessment Patient has had a recent decline in their functional status and demonstrates the ability to make significant improvements in function in a reasonable and predictable amount of time.     Precautions / Restrictions Precautions Precautions: Fall Restrictions Weight Bearing  Restrictions Per Provider Order: No      Mobility  Bed Mobility Overal bed mobility: Modified Independent             General bed mobility comments: Min extra time and effort but no physical assist needed    Transfers Overall transfer level: Needs assistance Equipment used: Rolling walker (2 wheels) Transfers: Sit to/from Stand Sit to Stand: Supervision           General transfer comment: Good eccentric and concentric control and stability    Ambulation/Gait Ambulation/Gait assistance: Supervision Gait Distance (Feet): 8 Feet Assistive device: Rolling walker (2 wheels) Gait Pattern/deviations: Step-through pattern, Decreased step length - right, Decreased step length - left Gait velocity: decreased     General Gait Details: Pt able to take steps forwards/backwards at the EOB with good stability and safe sequencing with the RW; distance limited by pt generally not feeling well with frequent diarrhea  Stairs            Wheelchair Mobility     Tilt Bed    Modified Rankin (Stroke Patients Only)       Balance Overall balance assessment: Needs assistance   Sitting balance-Leahy Scale: Normal     Standing balance support: Bilateral upper extremity supported, During functional activity Standing balance-Leahy Scale: Good                               Pertinent Vitals/Pain Pain Assessment Pain Assessment: 0-10 Pain Score: 3  Pain Location: BLEs Pain Descriptors / Indicators: Sore Pain Intervention(s): Monitored during session, Premedicated before session    Home Living  Family/patient expects to be discharged to:: Private residence Living Arrangements: Spouse/significant other Available Help at Discharge: Family;Available 24 hours/day Type of Home: House Home Access: Level entry       Home Layout: One level Home Equipment: Agricultural Consultant (2 wheels);Cane - single point;Shower seat - built in;Grab bars - tub/shower Additional  Comments: Has 2-3 steps inside the home down to the den with bilat rails but does not need to go to that room    Prior Function Prior Level of Function : Independent/Modified Independent             Mobility Comments: Ind amb in the home without AD, SPC in the community, no fall history in the last 6 months, 2LO2/min 24/7 at baseline ADLs Comments: Ind with ADLs     Extremity/Trunk Assessment   Upper Extremity Assessment Upper Extremity Assessment: Generalized weakness    Lower Extremity Assessment Lower Extremity Assessment: Generalized weakness       Communication   Communication Communication: No apparent difficulties    Cognition Arousal: Alert Behavior During Therapy: WFL for tasks assessed/performed   PT - Cognitive impairments: No apparent impairments                         Following commands: Intact       Cueing Cueing Techniques: Verbal cues     General Comments      Exercises Other Exercises Other Exercises: Pt education provided on benefits of time OOB to chair sitting upright vs staying in the bed during the day   Assessment/Plan    PT Assessment Patient needs continued PT services  PT Problem List Decreased strength;Decreased activity tolerance;Decreased balance;Decreased mobility;Decreased knowledge of use of DME       PT Treatment Interventions DME instruction;Gait training;Functional mobility training;Therapeutic activities;Therapeutic exercise;Balance training;Patient/family education    PT Goals (Current goals can be found in the Care Plan section)  Acute Rehab PT Goals Patient Stated Goal: To get stronger PT Goal Formulation: With patient Time For Goal Achievement: 01/04/25 Potential to Achieve Goals: Good    Frequency Min 2X/week     Co-evaluation               AM-PAC PT 6 Clicks Mobility  Outcome Measure Help needed turning from your back to your side while in a flat bed without using bedrails?: A  Little Help needed moving from lying on your back to sitting on the side of a flat bed without using bedrails?: A Little Help needed moving to and from a bed to a chair (including a wheelchair)?: A Little Help needed standing up from a chair using your arms (e.g., wheelchair or bedside chair)?: A Little Help needed to walk in hospital room?: A Little Help needed climbing 3-5 steps with a railing? : A Little 6 Click Score: 18    End of Session Equipment Utilized During Treatment: Gait belt;Oxygen Activity Tolerance: Patient tolerated treatment well Patient left: in bed;with call bell/phone within reach;with bed alarm set;with family/visitor present;Other (comment) (Pt declined OOB to chair) Nurse Communication: Mobility status PT Visit Diagnosis: Difficulty in walking, not elsewhere classified (R26.2);Muscle weakness (generalized) (M62.81)    Time: 8582-8557 PT Time Calculation (min) (ACUTE ONLY): 25 min   Charges:   PT Evaluation $PT Eval Moderate Complexity: 1 Mod   PT General Charges $$ ACUTE PT VISIT: 1 Visit     D. Glendia Bertin PT, DPT 12/22/2024, 3:27 PM

## 2024-12-22 NOTE — ED Notes (Signed)
 Pt helped off bedside commode. Waste discarded and a new bag placed for next void.

## 2024-12-22 NOTE — Progress Notes (Signed)
 Patient arrived to room 256. VS stable and patient free from pain, patient refusing to wear non slid socks. Bed alarm on and call bell in reach.

## 2024-12-22 NOTE — Progress Notes (Addendum)
 "    Progress Note    Alexandra [name]  FMW:[id number] DOB: Oct 10, 1941  DOA: 12/21/2024 PCP: Derick Leita POUR, MD      Brief Narrative:    Medical records reviewed and are as summarized below:  Alexandra [name] is a 84 y.o. female  with medical history of chronic hypoxic respiratory failure secondary to COPD on 2 L, history of lung cancer status post SBRT, hyperlipidemia, opioid use disorder, GAD, MDD, who presented to the hospital with worsening shortness of breath and cough productive of greenish sputum.  Symptoms had been going on for about 2 weeks.  She said she was given her first round of antibiotics without any improvement.  She was then given a second round of antibiotics (cefpodoxime and doxycycline).  She had taken this for 3 days but did not see any improvement.  She therefore reported to the ED for further evaluation.  She normally uses 2.5 L oxygen at home but she said she had had to go up to 3 L oxygen because of worsening shortness of breath.  She has a complaint of diarrhea for about 1 week duration since she started taking antibiotics.   Vital signs in the ED: Temperature 98.2 F, respiratory 22, pulse 127, BP 150/84, oxygen saturation 97% on 4 L oxygen.   Chest x-ray IMPRESSION: 1. Increased bibasilar atelectasis or infiltrates with trace bilateral pleural effusions. 2. Right apical mass/post-radiation change.     Assessment/Plan:   Principal Problem:   Sepsis due to pneumonia [facility]) Active Problems:   COPD with acute exacerbation (HCC)   Chronic obstructive pulmonary disease (COPD) (HCC)   Opioid dependence (HCC)   Chronic hypoxic respiratory failure (HCC)   Malignant neoplasm of upper lobe of right lung (HCC)   Anxiety and depression   GERD (gastroesophageal reflux disease)   Hyperlipidemia   Elevated blood pressure reading    Body mass index is 19.2 kg/m.    Sepsis secondary to community-acquired pneumonia: Continue IV ceftriaxone  and azithromycin .   Follow-up blood cultures. SARS-CoV-2, influenza and RSV test were negative. Failed outpatient antibiotics (cefpodoxime and doxycycline)  Sinus tachycardia likely from sepsis has improved.   Acute on chronic hypoxic respiratory failure: Continue 4 L oxygen via nasal cannula.  She uses 2.5 L oxygen at home.   COPD exacerbation: Continue prednisone  and bronchodilators.   Acute diarrheal illness: This is likely from recent antibiotic use.  Stool for C. difficile toxin and GI panel has not been collected.  Imodium  as needed for diarrhea. Of note, she says she has been using Imodium  at home.   Mild left leg swelling: Venous duplex negative for DVT.   History of right lung cancer s/p radiation therapy: Recent CT chest on 10/17/2024 showed chronic radiation changes in the right upper lobe with progressive consolidation and volume loss, pulmonary nodules. She saw Dr. Melanee, oncologist for follow-up on 10/24/2024.  She recommended repeat CT chest in 6 months.   Hypertension: BP is elevated.  Continue to monitor off antihypertensives   Comorbidities include general anxiety disorder, major depressive disorder, hyperlipidemia, GERD     Diet Order             Diet regular Room service appropriate? Yes; Fluid consistency: Thin  Diet effective now                                  Consultants: None  Procedures: None    Medications:  amitriptyline   50 mg Oral QHS   aspirin   81 mg Oral Daily   budesonide  (PULMICORT ) nebulizer solution  0.5 mg Nebulization BID   celecoxib   200 mg Oral BID   cholecalciferol   1,000 Units Oral Daily   gabapentin   1,200 mg Oral TID   heparin   5,000 Units Subcutaneous Q8H   ipratropium-albuterol   3 mL Nebulization Q6H   pantoprazole   40 mg Oral BID   predniSONE   40 mg Oral Q breakfast   sertraline   25 mg Oral Daily   simvastatin   40 mg Oral QPM   sodium chloride  flush  3 mL Intravenous Q12H   zolpidem   5 mg Oral QHS    Continuous Infusions:  azithromycin  Stopped (12/21/24 2205)   cefTRIAXone  (ROCEPHIN )  IV Stopped (12/21/24 2122)   lactated ringers  100 mL/hr at 12/22/24 0405     Anti-infectives (From admission, onward)    Start     Dose/Rate Route Frequency Ordered Stop   12/21/24 1945  cefTRIAXone  (ROCEPHIN ) 2 g in sodium chloride  0.9 % 100 mL IVPB        2 g 200 mL/hr over 30 Minutes Intravenous Every 24 hours 12/21/24 1936 12/26/24 1944   12/21/24 1945  azithromycin  (ZITHROMAX ) 500 mg in sodium chloride  0.9 % 250 mL IVPB        500 mg 250 mL/hr over 60 Minutes Intravenous Every 24 hours 12/21/24 1936 12/24/24 1944   12/21/24 1600  vancomycin  (VANCOREADY) IVPB 1250 mg/250 mL        1,250 mg 166.7 mL/hr over 90 Minutes Intravenous  Once 12/21/24 1549 12/21/24 1759   12/21/24 1600  ceFEPIme  (MAXIPIME ) 2 g in sodium chloride  0.9 % 100 mL IVPB        2 g 200 mL/hr over 30 Minutes Intravenous  Once 12/21/24 1549 12/21/24 1654              Family Communication/Anticipated D/C date and plan/Code Status   DVT prophylaxis: heparin  injection 5,000 Units Start: 12/21/24 1730     Code Status: Full Code  Family Communication: Plan discussed with [name], daughter, over the phone Disposition Plan: Plan to discharge home   Status is: Inpatient Remains inpatient appropriate because: Sepsis secondary to pneumonia       Subjective:   Interval events noted.  She complains of cough and shortness of breath but she feels a little better today.  She also complains of diarrhea.  She had 2 loose stools overnight and had 1 loose stool this morning.  No chest pain, leg pain or leg swelling.    Objective:    Vitals:   12/22/24 0700 12/22/24 0730 12/22/24 0800 12/22/24 0815  BP: (!) 172/78 (!) 177/76 (!) 175/74 (!) 178/75  Pulse: 91 85 89 89  Resp: 15 15 17 13   Temp:    98.4 F (36.9 C)  TempSrc:    Oral  SpO2:    99%  Weight:      Height:       No data found.   Intake/Output  Summary (Last 24 hours) at 12/22/2024 0845 Last data filed at 12/21/2024 2205 Gross per 24 hour  Intake 343.68 ml  Output --  Net 343.68 ml   Filed Weights   12/21/24 1330  Weight: 59 kg    Exam:  GEN: NAD SKIN: Warm and dry EYES: No pallor or icterus ENT: MMM CV: RRR PULM: Bibasilar rales ABD: soft, ND, NT, +BS CNS: AAO x 3, non focal EXT: No edema or tenderness.  Left leg appears slightly bigger than the right but patient said right lower extremity is shorter than the left because of previous right hip surgery.        Data Reviewed:   I have personally reviewed following labs and imaging studies:  Labs: Labs show the following:   Basic Metabolic Panel: Recent Labs  Lab 12/21/24 1331 12/21/24 1713 12/22/24 0503  NA 140  --  140  K 4.2  --  3.9  CL 95*  --  97*  CO2 35*  --  34*  GLUCOSE 114*  --  142*  BUN 9  --  12  CREATININE 0.64  --  0.64  CALCIUM 9.4  --  9.7  MG  --  1.9  --    GFR Estimated Creatinine Clearance: 50.5 mL/min (by C-G formula based on SCr of 0.64 mg/dL). Liver Function Tests: No results for input(s): AST, ALT, ALKPHOS, BILITOT, PROT, ALBUMIN in the last 168 hours. No results for input(s): LIPASE, AMYLASE in the last 168 hours. No results for input(s): AMMONIA in the last 168 hours. Coagulation profile No results for input(s): INR, PROTIME in the last 168 hours.  CBC: Recent Labs  Lab 12/21/24 1331 12/22/24 0503  WBC 17.3* 10.6*  HGB 8.8* 8.3*  HCT 29.6* 28.0*  MCV 90.8 90.3  PLT 378 362   Cardiac Enzymes: No results for input(s): CKTOTAL, CKMB, CKMBINDEX, TROPONINI in the last 168 hours. BNP (last 3 results) No results for input(s): PROBNP in the last 8760 hours. CBG: Recent Labs  Lab 12/22/24 0752  GLUCAP 127*   D-Dimer: No results for input(s): DDIMER in the last 72 hours. Hgb A1c: No results for input(s): HGBA1C in the last 72 hours. Lipid Profile: No results for input(s):  CHOL, HDL, LDLCALC, TRIG, CHOLHDL, LDLDIRECT in the last 72 hours. Thyroid  function studies: No results for input(s): TSH, T4TOTAL, T3FREE, THYROIDAB in the last 72 hours.  Invalid input(s): FREET3 Anemia work up: No results for input(s): VITAMINB12, FOLATE, FERRITIN, TIBC, IRON, RETICCTPCT in the last 72 hours. Sepsis Labs: Recent Labs  Lab 12/21/24 1331 12/21/24 1610 12/21/24 1743 12/22/24 0503  PROCALCITON  --   --  0.18  --   WBC 17.3*  --   --  10.6*  LATICACIDVEN  --  0.8  --   --     Microbiology Recent Results (from the past 240 hours)  Resp panel by RT-PCR (RSV, Flu A&B, Covid) Anterior Nasal Swab     Status: None   Collection Time: 12/21/24  4:10 PM   Specimen: Anterior Nasal Swab  Result Value Ref Range Status   SARS Coronavirus 2 by RT PCR NEGATIVE NEGATIVE Final    Comment: (NOTE) SARS-CoV-2 target nucleic acids are NOT DETECTED.  The SARS-CoV-2 RNA is generally detectable in upper respiratory specimens during the acute phase of infection. The lowest concentration of SARS-CoV-2 viral copies this assay can detect is 138 copies/mL. A negative result does not preclude SARS-Cov-2 infection and should not be used as the sole basis for treatment or other patient management decisions. A negative result may occur with  improper specimen collection/handling, submission of specimen other than nasopharyngeal swab, presence of viral mutation(s) within the areas targeted by this assay, and inadequate number of viral copies(<138 copies/mL). A negative result must be combined with clinical observations, patient history, and epidemiological information. The expected result is Negative.  Fact Sheet for Patients:  bloggercourse.com  Fact Sheet for Healthcare Providers:  seriousbroker.it  This test is  no t yet approved or cleared by the [place]  FDA and  has been authorized for detection  and/or diagnosis of SARS-CoV-2 by FDA under an Emergency Use Authorization (EUA). This EUA will remain  in effect (meaning this test can be used) for the duration of the COVID-19 declaration under Section 564(b)(1) of the Act, 21 U.S.C.section 360bbb-3(b)(1), unless the authorization is terminated  or revoked sooner.       Influenza A by PCR NEGATIVE NEGATIVE Final   Influenza B by PCR NEGATIVE NEGATIVE Final    Comment: (NOTE) The Xpert Xpress SARS-CoV-2/FLU/RSV plus assay is intended as an aid in the diagnosis of influenza from Nasopharyngeal swab specimens and should not be used as a sole basis for treatment. Nasal washings and aspirates are unacceptable for Xpert Xpress SARS-CoV-2/FLU/RSV testing.  Fact Sheet for Patients: bloggercourse.com  Fact Sheet for Healthcare Providers: seriousbroker.it  This test is not yet approved or cleared by the [place]  FDA and has been authorized for detection and/or diagnosis of SARS-CoV-2 by FDA under an Emergency Use Authorization (EUA). This EUA will remain in effect (meaning this test can be used) for the duration of the COVID-19 declaration under Section 564(b)(1) of the Act, 21 U.S.C. section 360bbb-3(b)(1), unless the authorization is terminated or revoked.     Resp Syncytial Virus by PCR NEGATIVE NEGATIVE Final    Comment: (NOTE) Fact Sheet for Patients: bloggercourse.com  Fact Sheet for Healthcare Providers: seriousbroker.it  This test is not yet approved or cleared by the [place]  FDA and has been authorized for detection and/or diagnosis of SARS-CoV-2 by FDA under an Emergency Use Authorization (EUA). This EUA will remain in effect (meaning this test can be used) for the duration of the COVID-19 declaration under Section 564(b)(1) of the Act, 21 U.S.C. section 360bbb-3(b)(1), unless the authorization is terminated  or revoked.  Performed at [facility], [address] [place], [place] [address]   Blood culture (routine x 2)     Status: None (Preliminary result)   Collection Time: 12/21/24  4:10 PM   Specimen: BLOOD  Result Value Ref Range Status   Specimen Description BLOOD BLOOD RIGHT ARM  Final   Special Requests   Final    BOTTLES DRAWN AEROBIC AND ANAEROBIC Blood Culture adequate volume   Culture   Final    NO GROWTH < 24 HOURS Performed at [facility], [address] [place], [place] [address]    Report Status PENDING  Incomplete  Blood culture (routine x 2)     Status: None (Preliminary result)   Collection Time: 12/21/24  4:10 PM   Specimen: BLOOD  Result Value Ref Range Status   Specimen Description BLOOD BLOOD RIGHT ARM  Final   Special Requests   Final    BOTTLES DRAWN AEROBIC AND ANAEROBIC Blood Culture results may not be optimal due to an inadequate volume of blood received in culture bottles   Culture   Final    NO GROWTH < 24 HOURS Performed at [facility], [address], [place], [place] [address]    Report Status PENDING  Incomplete    Procedures and diagnostic studies:  DG Chest 2 View Result Date: 12/21/2024 EXAM: 2 VIEW(S) XRAY OF THE CHEST 12/21/2024 01:45:00 PM COMPARISON: 10/27/2023 CLINICAL HISTORY: sob FINDINGS: LUNGS AND PLEURA: Biapical scarring. Right apical mass/post-radiation change. Increased bibasilar atelectasis or infiltrates . Trace bilateral pleural effusions. No pneumothorax. HEART AND MEDIASTINUM: No acute abnormality of the cardiac and mediastinal silhouettes. BONES  AND SOFT TISSUES: No acute osseous abnormality. IMPRESSION: 1. Increased bibasilar atelectasis or infiltrates with trace bilateral pleural effusions. 2. Right apical mass/post-radiation change. Electronically signed by: Norman Gatlin MD 12/21/2024 01:58 PM EST RP Workstation: [id number]               LOS: 0 days   Amear Strojny  [facility]   Pager on [email or web address]. If 7PM-7AM, please contact night-coverage at [email or web address]     12/22/2024, 8:45 AM           "

## 2024-12-22 NOTE — Progress Notes (Signed)
 PT Cancellation Note  Patient Details Name: HADAR ELGERSMA MRN: [id number] DOB: 05/28/1941   Cancelled Treatment:    Reason Eval/Treat Not Completed: Other (comment): Dr. Jens requested PT to hold via secure chat this AM pending results of venous duplex scan.  Will attempt to see pt at a future date/time as medically appropriate.     CHARM Glendia Bertin PT, DPT 12/22/2024, 9:40 AM

## 2024-12-23 DIAGNOSIS — J189 Pneumonia, unspecified organism: Secondary | ICD-10-CM | POA: Diagnosis not present

## 2024-12-23 DIAGNOSIS — A419 Sepsis, unspecified organism: Secondary | ICD-10-CM | POA: Diagnosis not present

## 2024-12-23 LAB — BASIC METABOLIC PANEL WITH GFR
Anion gap: 9 (ref 5–15)
BUN: 15 mg/dL (ref 8–23)
CO2: 38 mmol/L — ABNORMAL HIGH (ref 22–32)
Calcium: 9.5 mg/dL (ref 8.9–10.3)
Chloride: 94 mmol/L — ABNORMAL LOW (ref 98–111)
Creatinine, Ser: 0.79 mg/dL (ref 0.44–1.00)
GFR, Estimated: >60 mL/min
Glucose, Bld: 108 mg/dL — ABNORMAL HIGH (ref 70–99)
Potassium: 3.4 mmol/L — ABNORMAL LOW (ref 3.5–5.1)
Sodium: 141 mmol/L (ref 135–145)

## 2024-12-23 LAB — BLOOD GAS, VENOUS
Bicarbonate: 34.1 mmol/L — ABNORMAL HIGH (ref 20.0–28.0)
O2 Saturation: 41.2 % — AB (ref 0.0–2.0)
Patient temperature: 37
Patient temperature: 41.2 %
pCO2, Ven: 59 mmHg (ref 44–60)
pH, Ven: 7.37 (ref 7.25–7.43)
pO2, Ven: 34.1 mmHg — CL (ref 32–45)

## 2024-12-23 LAB — GLUCOSE, CAPILLARY: Glucose-Capillary: 89 mg/dL (ref 70–99)

## 2024-12-23 LAB — CBC
HCT: 29.5 % — ABNORMAL LOW (ref 36.0–46.0)
Hemoglobin: 8.7 g/dL — ABNORMAL LOW (ref 12.0–15.0)
MCH: 27 pg (ref 26.0–34.0)
MCHC: 29.5 g/dL — ABNORMAL LOW (ref 30.0–36.0)
MCV: 91.6 fL (ref 80.0–100.0)
Platelets: 513 K/uL — ABNORMAL HIGH (ref 150–400)
RBC: 3.22 MIL/uL — ABNORMAL LOW (ref 3.87–5.11)
RDW: 14.3 % (ref 11.5–15.5)
WBC: 19.4 K/uL — ABNORMAL HIGH (ref 4.0–10.5)
nRBC: 0 % (ref 0.0–0.2)

## 2024-12-23 LAB — PHOSPHORUS: Phosphorus: 3.8 mg/dL (ref 2.5–4.6)

## 2024-12-23 LAB — MAGNESIUM: Magnesium: 2 mg/dL (ref 1.7–2.4)

## 2024-12-23 MED ORDER — LATANOPROST 0.005 % OP SOLN
1.0000 [drp] | Freq: Every day | OPHTHALMIC | Status: DC
  Administered 2024-12-23: 1 [drp] via OPHTHALMIC
  Filled 2024-12-23 (×2): qty 2.5

## 2024-12-23 MED ORDER — IPRATROPIUM-ALBUTEROL 0.5-2.5 (3) MG/3ML IN SOLN: 3.0000 mL | Freq: Four times a day (QID) | RESPIRATORY_TRACT | Status: DC | PRN

## 2024-12-23 MED ORDER — HYDROCODONE-ACETAMINOPHEN 5-325 MG PO TABS
2.0000 | ORAL_TABLET | Freq: Four times a day (QID) | ORAL | Status: DC | PRN
  Administered 2024-12-23 – 2024-12-24 (×4): 2 via ORAL
  Filled 2024-12-23 (×4): qty 2

## 2024-12-23 MED ORDER — AZITHROMYCIN 250 MG PO TABS
500.0000 mg | ORAL_TABLET | Freq: Every day | ORAL | Status: AC
  Administered 2024-12-23: 500 mg via ORAL
  Filled 2024-12-23: qty 2

## 2024-12-23 MED ORDER — POTASSIUM CHLORIDE CRYS ER 20 MEQ PO TBCR
40.0000 meq | EXTENDED_RELEASE_TABLET | ORAL | Status: AC
  Administered 2024-12-23 (×2): 40 meq via ORAL
  Filled 2024-12-23 (×2): qty 2

## 2024-12-23 NOTE — Progress Notes (Signed)
 PHARMACIST - PHYSICIAN COMMUNICATION DR:   Jens CONCERNING: Antibiotic IV to Oral Route Change Policy  RECOMMENDATION: This patient is receiving Azithromycin  by the intravenous route.  Based on criteria approved by the Pharmacy and Therapeutics Committee, the antibiotic(s) is/are being converted to the equivalent oral dose form(s).   DESCRIPTION: These criteria include: Patient being treated for a respiratory tract infection, urinary tract infection, cellulitis or clostridium difficile associated diarrhea if on metronidazole The patient is not neutropenic and does not exhibit a GI malabsorption state The patient is eating (either orally or via tube) and/or has been taking other orally administered medications for a least 24 hours The patient is improving clinically and has a Tmax < 100.5  Dewaine Morocho Rodriguez-Guzman PharmD, BCPS 12/23/2024 12:42 PM

## 2024-12-23 NOTE — TOC Progression Note (Signed)
 Transition of Care [facility]) - Progression Note    Patient Details  Name: Alexandra [name] MRN: [id number] Date of Birth: 06-18-1941  Transition of Care [facility]) CM/SW Contact  Lauraine JAYSON Carpen, LCSW Phone Number: 12/23/2024, 10:22 AM  Clinical Narrative: Reviewed home health options with patient. She prefers [facility] because they worked with her husband in the past. Liaison is aware. Notified her of need for PT and RN for COPD. Patient confirmed she has home oxygen through [employer]. Her daughter will transport her home at discharge.    Expected Discharge Plan: Home w Home Health Services Barriers to Discharge: Continued Medical Work up               Expected Discharge Plan and Services   Discharge Planning Services: CM Consult                                           Social Drivers of Health (SDOH) Interventions SDOH Screenings   Food Insecurity: No Food Insecurity (12/22/2024)  Housing: Low Risk (12/22/2024)  Transportation Needs: No Transportation Needs (12/22/2024)  Utilities: Not At Risk (12/22/2024)  Alcohol Screen: Low Risk (03/27/2023)  Depression (PHQ2-9): Low Risk (07/17/2024)  Financial Resource Strain: Low Risk  (02/19/2024)   [facility] from [facility]  Physical Activity: Inactive (03/27/2023)  Social Connections: Socially Isolated (12/22/2024)  Stress: No Stress Concern Present (03/27/2023)  Tobacco Use: Medium Risk (12/21/2024)    Readmission Risk Interventions     No data to display

## 2024-12-23 NOTE — Progress Notes (Addendum)
 "    Progress Note    Alexandra [name]  FMW:[id number] DOB: [birth year]  DOA: 12/21/2024 PCP: Derick Leita POUR, MD      Brief Narrative:    Medical records reviewed and are as summarized below:  Alexandra [name] is a 84 y.o. female  with medical history of chronic hypoxic respiratory failure secondary to COPD on 2 L, history of lung cancer status post SBRT, hyperlipidemia, opioid use disorder, GAD, MDD, who presented to the hospital with worsening shortness of breath and cough productive of greenish sputum.  Symptoms had been going on for about 2 weeks.  She said she was given her first round of antibiotics without any improvement.  She was then given a second round of antibiotics (cefpodoxime and doxycycline).  She had taken this for 3 days but did not see any improvement.  She therefore reported to the ED for further evaluation.  She normally uses 2.5 L oxygen at home but she said she had had to go up to 3 L oxygen because of worsening shortness of breath.  She has a complaint of diarrhea for about 1 week duration since she started taking antibiotics.   Vital signs in the ED: Temperature 98.2 F, respiratory 22, pulse 127, BP 150/84, oxygen saturation 97% on 4 L oxygen.   Chest x-ray IMPRESSION: 1. Increased bibasilar atelectasis or infiltrates with trace bilateral pleural effusions. 2. Right apical mass/post-radiation change.     Assessment/Plan:   Principal Problem:   Sepsis due to pneumonia [facility]) Active Problems:   COPD with acute exacerbation (HCC)   Chronic obstructive pulmonary disease (COPD) (HCC)   Opioid dependence (HCC)   Chronic hypoxic respiratory failure (HCC)   Malignant neoplasm of upper lobe of right lung (HCC)   Anxiety and depression   GERD (gastroesophageal reflux disease)   Hyperlipidemia   Elevated blood pressure reading    Body mass index is 19.79 kg/m.    Sepsis secondary to community-acquired pneumonia: Continue IV azithromycin  and ceftriaxone .  No  growth on blood cultures thus far.   SARS-CoV-2, influenza and RSV test were negative. Failed outpatient antibiotics.  According to her daughter, she was initially given an antibiotic injection in the doctor's office and a 3-day course of antibiotics (suspect this was azithromycin ).  However, her condition did not improve and she was given cefpodoxime and doxycycline which she took for about 3 days before coming to the hospital.  Sinus tachycardia likely from sepsis has improved. Worsening leukocytosis: Suspect this is from steroids.   Acute on chronic hypoxic respiratory failure: Oxygen weaned down from 4 L to 3 L via [place].  She uses 2.5 L oxygen at home.   COPD exacerbation: Continue bronchodilators.  Discontinue prednisone .   Acute diarrheal illness: This is likely from recent antibiotic use.  No abdominal pain or fever.  Imodium  as needed for diarrhea.  Of note, she says she has been using Imodium  at home.   Hypokalemia: Replete potassium and monitor levels.    Mild left leg swelling: Venous duplex negative for DVT.   History of right lung cancer s/p radiation therapy: Recent CT chest on 10/17/2024 showed chronic radiation changes in the right upper lobe with progressive consolidation and volume loss, pulmonary nodules. She saw Dr. Melanee, oncologist for follow-up on 10/24/2024.  She recommended repeat CT chest in 6 months.   Hypertension: BP is better.  BP came down to 109/96 and then went up to 149/81.  Will hold off on starting antihypertensives for now  Comorbidities include general anxiety disorder, major depressive disorder, hyperlipidemia, GERD     Diet Order             Diet regular Room service appropriate? Yes; Fluid consistency: Thin  Diet effective now                                  Consultants: None  Procedures: None    Medications:    amitriptyline   50 mg Oral QHS   aspirin   81 mg Oral Daily   budesonide  (PULMICORT ) nebulizer  solution  0.5 mg Nebulization BID   celecoxib   200 mg Oral BID   cholecalciferol   1,000 Units Oral Daily   gabapentin   1,200 mg Oral TID   heparin   5,000 Units Subcutaneous Q8H   latanoprost   1 drop Both Eyes QHS   pantoprazole   40 mg Oral BID   sertraline   25 mg Oral Daily   simvastatin   40 mg Oral QPM   sodium chloride  flush  3 mL Intravenous Q12H   zolpidem   5 mg Oral QHS   Continuous Infusions:  cefTRIAXone  (ROCEPHIN )  IV 2 g (12/22/24 1743)     Anti-infectives (From admission, onward)    Start     Dose/Rate Route Frequency Ordered Stop   12/23/24 1500  azithromycin  (ZITHROMAX ) tablet 500 mg        500 mg Oral Daily 12/23/24 1244 12/23/24 1415   12/21/24 1945  cefTRIAXone  (ROCEPHIN ) 2 g in sodium chloride  0.9 % 100 mL IVPB        2 g 200 mL/hr over 30 Minutes Intravenous Every 24 hours 12/21/24 1936 12/26/24 1944   12/21/24 1945  azithromycin  (ZITHROMAX ) 500 mg in sodium chloride  0.9 % 250 mL IVPB  Status:  Discontinued        500 mg 250 mL/hr over 60 Minutes Intravenous Every 24 hours 12/21/24 1936 12/23/24 1242   12/21/24 1600  vancomycin  (VANCOREADY) IVPB 1250 mg/250 mL        1,250 mg 166.7 mL/hr over 90 Minutes Intravenous  Once 12/21/24 1549 12/21/24 1759   12/21/24 1600  ceFEPIme  (MAXIPIME ) 2 g in sodium chloride  0.9 % 100 mL IVPB        2 g 200 mL/hr over 30 Minutes Intravenous  Once 12/21/24 1549 12/21/24 1654              Family Communication/Anticipated D/C date and plan/Code Status   DVT prophylaxis: heparin  injection 5,000 Units Start: 12/21/24 1730     Code Status: Full Code  Family Communication: None Disposition Plan: Plan to discharge home   Status is: Inpatient Remains inpatient appropriate because: Sepsis secondary to pneumonia       Subjective:   Interval events noted.  She still has a cough but is better.  Breathing is better.  She still has some diarrhea.   Objective:    Vitals:   12/23/24 0505 12/23/24 0805 12/23/24  1202 12/23/24 1359  BP: (!) 165/75 (!) 166/77 (!) 109/96   Pulse: 85 86 96   Resp: 20 17 18    Temp: 98.1 F (36.7 C) 98.5 F (36.9 C) 98.1 F (36.7 C)   TempSrc:      SpO2: 100% 99% 94% 96%  Weight:      Height:       No data found.   Intake/Output Summary (Last 24 hours) at 12/23/2024 1645 Last data filed at 12/23/2024 1204 Gross per  24 hour  Intake --  Output 1100 ml  Net -1100 ml   Filed Weights   12/21/24 1330 12/23/24 0500  Weight: 59 kg 60.8 kg    Exam:  GEN: NAD SKIN: Warm and dry EYES: No pallor or icterus ENT: MMM CV: RRR PULM: Bibasilar rales.  No wheezing ABD: soft, ND, NT, +BS CNS: AAO x 3, non focal EXT: No edema or tenderness       Data Reviewed:   I have personally reviewed following labs and imaging studies:  Labs: Labs show the following:   Basic Metabolic Panel: Recent Labs  Lab 12/21/24 1331 12/21/24 1713 12/22/24 0502 12/22/24 0503 12/23/24 0422 12/23/24 0424  NA 140  --   --  140  --  141  K 4.2  --   --  3.9  --  3.4*  CL 95*  --   --  97*  --  94*  CO2 35*  --   --  34*  --  38*  GLUCOSE 114*  --   --  142*  --  108*  BUN 9  --   --  12  --  15  CREATININE 0.64  --   --  0.64  --  0.79  CALCIUM 9.4  --   --  9.7  --  9.5  MG  --  1.9 1.9  --  2.0  --   PHOS  --   --  3.6  --  3.8  --    GFR Estimated Creatinine Clearance: 52 mL/min (by C-G formula based on SCr of 0.79 mg/dL). Liver Function Tests: No results for input(s): AST, ALT, ALKPHOS, BILITOT, PROT, ALBUMIN in the last 168 hours. No results for input(s): LIPASE, AMYLASE in the last 168 hours. No results for input(s): AMMONIA in the last 168 hours. Coagulation profile No results for input(s): INR, PROTIME in the last 168 hours.  CBC: Recent Labs  Lab 12/21/24 1331 12/22/24 0503 12/23/24 0424  WBC 17.3* 10.6* 19.4*  HGB 8.8* 8.3* 8.7*  HCT 29.6* 28.0* 29.5*  MCV 90.8 90.3 91.6  PLT 378 362 513*   Cardiac Enzymes: No results for  input(s): CKTOTAL, CKMB, CKMBINDEX, TROPONINI in the last 168 hours. BNP (last 3 results) No results for input(s): PROBNP in the last 8760 hours. CBG: Recent Labs  Lab 12/22/24 0752 12/23/24 0802  GLUCAP 127* 89   D-Dimer: No results for input(s): DDIMER in the last 72 hours. Hgb A1c: No results for input(s): HGBA1C in the last 72 hours. Lipid Profile: No results for input(s): CHOL, HDL, LDLCALC, TRIG, CHOLHDL, LDLDIRECT in the last 72 hours. Thyroid  function studies: No results for input(s): TSH, T4TOTAL, T3FREE, THYROIDAB in the last 72 hours.  Invalid input(s): FREET3 Anemia work up: No results for input(s): VITAMINB12, FOLATE, FERRITIN, TIBC, IRON, RETICCTPCT in the last 72 hours. Sepsis Labs: Recent Labs  Lab 12/21/24 1331 12/21/24 1610 12/21/24 1743 12/22/24 0503 12/23/24 0424  PROCALCITON  --   --  0.18  --   --   WBC 17.3*  --   --  10.6* 19.4*  LATICACIDVEN  --  0.8  --   --   --     Microbiology Recent Results (from the past 240 hours)  Resp panel by RT-PCR (RSV, Flu A&B, Covid) Anterior Nasal Swab     Status: None   Collection Time: 12/21/24  4:10 PM   Specimen: Anterior Nasal Swab  Result Value Ref Range Status   SARS Coronavirus  2 by RT PCR NEGATIVE NEGATIVE Final    Comment: (NOTE) SARS-CoV-2 target nucleic acids are NOT DETECTED.  The SARS-CoV-2 RNA is generally detectable in upper respiratory specimens during the acute phase of infection. The lowest concentration of SARS-CoV-2 viral copies this assay can detect is 138 copies/mL. A negative result does not preclude SARS-Cov-2 infection and should not be used as the sole basis for treatment or other patient management decisions. A negative result may occur with  improper specimen collection/handling, submission of specimen other than nasopharyngeal swab, presence of viral mutation(s) within the areas targeted by this assay, and inadequate number of  viral copies(<138 copies/mL). A negative result must be combined with clinical observations, patient history, and epidemiological information. The expected result is Negative.  Fact Sheet for Patients:  bloggercourse.com  Fact Sheet for Healthcare Providers:  seriousbroker.it  This test is no t yet approved or cleared by the [place]  FDA and  has been authorized for detection and/or diagnosis of SARS-CoV-2 by FDA under an Emergency Use Authorization (EUA). This EUA will remain  in effect (meaning this test can be used) for the duration of the COVID-19 declaration under Section 564(b)(1) of the Act, 21 U.S.C.section 360bbb-3(b)(1), unless the authorization is terminated  or revoked sooner.       Influenza A by PCR NEGATIVE NEGATIVE Final   Influenza B by PCR NEGATIVE NEGATIVE Final    Comment: (NOTE) The Xpert Xpress SARS-CoV-2/FLU/RSV plus assay is intended as an aid in the diagnosis of influenza from Nasopharyngeal swab specimens and should not be used as a sole basis for treatment. Nasal washings and aspirates are unacceptable for Xpert Xpress SARS-CoV-2/FLU/RSV testing.  Fact Sheet for Patients: bloggercourse.com  Fact Sheet for Healthcare Providers: seriousbroker.it  This test is not yet approved or cleared by the [place]  FDA and has been authorized for detection and/or diagnosis of SARS-CoV-2 by FDA under an Emergency Use Authorization (EUA). This EUA will remain in effect (meaning this test can be used) for the duration of the COVID-19 declaration under Section 564(b)(1) of the Act, 21 U.S.C. section 360bbb-3(b)(1), unless the authorization is terminated or revoked.     Resp Syncytial Virus by PCR NEGATIVE NEGATIVE Final    Comment: (NOTE) Fact Sheet for Patients: bloggercourse.com  Fact Sheet for Healthcare  Providers: seriousbroker.it  This test is not yet approved or cleared by the [place]  FDA and has been authorized for detection and/or diagnosis of SARS-CoV-2 by FDA under an Emergency Use Authorization (EUA). This EUA will remain in effect (meaning this test can be used) for the duration of the COVID-19 declaration under Section 564(b)(1) of the Act, 21 U.S.C. section 360bbb-3(b)(1), unless the authorization is terminated or revoked.  Performed at [facility], [address] [place], [place] [address]   Blood culture (routine x 2)     Status: None (Preliminary result)   Collection Time: 12/21/24  4:10 PM   Specimen: BLOOD  Result Value Ref Range Status   Specimen Description BLOOD BLOOD RIGHT ARM  Final   Special Requests   Final    BOTTLES DRAWN AEROBIC AND ANAEROBIC Blood Culture adequate volume   Culture   Final    NO GROWTH 2 DAYS Performed at [facility], [address], [place], [place] [address]    Report Status PENDING  Incomplete  Blood culture (routine x 2)     Status: None (Preliminary result)   Collection Time: 12/21/24  4:10 PM   Specimen: BLOOD  Result  Value Ref Range Status   Specimen Description BLOOD BLOOD RIGHT ARM  Final   Special Requests   Final    BOTTLES DRAWN AEROBIC AND ANAEROBIC Blood Culture results may not be optimal due to an inadequate volume of blood received in culture bottles   Culture   Final    NO GROWTH 2 DAYS Performed at [facility], [address], [place], [place] [address]    Report Status PENDING  Incomplete    Procedures and diagnostic studies:  US  Venous Img Lower Unilateral Left (DVT) Result Date: 12/22/2024 CLINICAL DATA:  84 year old female with left lower extremity swelling. EXAM: LEFT LOWER EXTREMITY VENOUS DOPPLER ULTRASOUND TECHNIQUE: Gray-scale sonography with graded compression, as well as color Doppler and duplex ultrasound were performed to evaluate the  left lower extremity deep venous systems from the level of the common femoral vein and including the common femoral, femoral, profunda femoral, popliteal and calf veins including the posterior tibial, peroneal and gastrocnemius veins when visible. Spectral Doppler was utilized to evaluate flow at rest and with distal augmentation maneuvers in the common femoral, femoral and popliteal veins. The contralateral common femoral vein was also evaluated for comparison. COMPARISON:  None Available. FINDINGS: LEFT LOWER EXTREMITY Common Femoral Vein: No evidence of thrombus. Normal compressibility, respiratory phasicity and response to augmentation. Central Greater Saphenous Vein: No evidence of thrombus. Normal compressibility and flow on color Doppler imaging. Central Profunda Femoral Vein: No evidence of thrombus. Normal compressibility and flow on color Doppler imaging. Femoral Vein: No evidence of thrombus. Normal compressibility, respiratory phasicity and response to augmentation. Popliteal Vein: No evidence of thrombus. Normal compressibility, respiratory phasicity and response to augmentation. Calf Veins: No evidence of thrombus. Normal compressibility and flow on color Doppler imaging. Other Findings:  None. RIGHT LOWER EXTREMITY Common Femoral Vein: No evidence of thrombus. Normal compressibility, respiratory phasicity and response to augmentation. IMPRESSION: No evidence of left lower extremity deep venous thrombosis. Ester Sides, MD [facility] Electronically Signed   By: Ester Sides M.D.   On: 12/22/2024 10:59               LOS: 1 day   Arlen Dupuis  [facility]   Pager on [email or web address]. If 7PM-7AM, please contact night-coverage at [email or web address]     12/23/2024, 4:45 PM           "

## 2024-12-23 NOTE — Plan of Care (Signed)

## 2024-12-23 NOTE — Progress Notes (Signed)
 Physical Therapy Treatment Patient Details Name: Alexandra [name] MRN: [id number] DOB: Mar 07, 1941 Today's Date: 12/23/2024   History of Present Illness Pt is an 84 y.o. year old female with medical history of chronic hypoxic respiratory failure secondary to COPD on 2 L, history of lung cancer status post SBRT, hyperlipidemia, opioid use disorder, GAD and MDD presenting to the ED with worsening shortness of breath.  MD assessment includes: Sepsis due to pneumonia, COPD exacerbation likely secondary to pneumonia, sinus tachycardia, and LLE swelling.    PT Comments  Pt was pleasant but guarded regarding mobility due to diarrhea concerns.  Pt education provided on physiological benefits of activity with plan made for amb in room in order to stay close to the [facility] if needed. Pt ultimately put forth good effort during the session and was able to amb 15 feet followed by 8 feet with seated therapeutic rest break between bouts with PLB education. Pt's SpO2 dropped to a low of 88% on 3LO2/min but quickly increased to the low 90s once back in sitting with PLB.  HEP education provided and pt left in sitting with SpO2 93% and HR in the low 90s.  Pt will benefit from continued PT services upon discharge to safely address deficits listed in patient problem list for decreased caregiver assistance and eventual return to PLOF.      If plan is discharge home, recommend the following: A little help with walking and/or transfers;A little help with bathing/dressing/bathroom;Assistance with cooking/housework;Assist for transportation   Can travel by private vehicle        Equipment Recommendations  Rolling walker (2 wheels)    Recommendations for Other Services       Precautions / Restrictions Precautions Precautions: Fall Restrictions Weight Bearing Restrictions Per Provider Order: No     Mobility  Bed Mobility Overal bed mobility: Modified Independent             General bed mobility comments: Min extra  time and effort but no physical assist needed    Transfers Overall transfer level: Needs assistance Equipment used: Rolling walker (2 wheels) Transfers: Sit to/from Stand Sit to Stand: Supervision           General transfer comment: Good eccentric and concentric control and stability from multiple height surfaces    Ambulation/Gait Ambulation/Gait assistance: Supervision Gait Distance (Feet): 15 Feet x 1, 8 Feet x 1 Assistive device: Rolling walker (2 wheels) Gait Pattern/deviations: Step-through pattern, Decreased step length - right, Decreased step length - left Gait velocity: decreased     General Gait Details: Slow cadence but steady with no overt LOB including during 180 deg turns   Stairs             Wheelchair Mobility     Tilt Bed    Modified Rankin (Stroke Patients Only)       Balance Overall balance assessment: Needs assistance   Sitting balance-Leahy Scale: Normal     Standing balance support: Bilateral upper extremity supported, During functional activity Standing balance-Leahy Scale: Good                              Communication Communication Communication: No apparent difficulties  Cognition Arousal: Alert Behavior During Therapy: WFL for tasks assessed/performed   PT - Cognitive impairments: No apparent impairments                         Following commands:  Intact      Cueing Cueing Techniques: Verbal cues  Exercises Other Exercises Other Exercises: HEP education for BLE APs, LAQs, and seated marching x 10 each every hour as tolerated    General Comments        Pertinent Vitals/Pain Pain Assessment Pain Assessment: No/denies pain    Home Living                          Prior Function            PT Goals (current goals can now be found in the care plan section) Progress towards PT goals: Progressing toward goals    Frequency    Min 2X/week      PT Plan       Co-evaluation              AM-PAC PT 6 Clicks Mobility   Outcome Measure  Help needed turning from your back to your side while in a flat bed without using bedrails?: A Little Help needed moving from lying on your back to sitting on the side of a flat bed without using bedrails?: A Little Help needed moving to and from a bed to a chair (including a wheelchair)?: A Little Help needed standing up from a chair using your arms (e.g., wheelchair or bedside chair)?: A Little Help needed to walk in hospital room?: A Little Help needed climbing 3-5 steps with a railing? : A Little 6 Click Score: 18    End of Session Equipment Utilized During Treatment: Gait belt;Oxygen Activity Tolerance: Patient tolerated treatment well Patient left: in chair;with call bell/phone within reach;with family/visitor present Nurse Communication: Mobility status;Other (comment) (Chair alarm pad in place but no box to plug it into, nsg notified) PT Visit Diagnosis: Difficulty in walking, not elsewhere classified (R26.2);Muscle weakness (generalized) (M62.81)     Time: 8390-8364 PT Time Calculation (min) (ACUTE ONLY): 26 min  Charges:    $Therapeutic Activity: 23-37 mins PT General Charges $$ ACUTE PT VISIT: 1 Visit                     D. Scott Adison Jerger PT, DPT 12/23/2024, 5:11 PM

## 2024-12-24 ENCOUNTER — Other Ambulatory Visit: Payer: Self-pay

## 2024-12-24 DIAGNOSIS — J441 Chronic obstructive pulmonary disease with (acute) exacerbation: Secondary | ICD-10-CM

## 2024-12-24 DIAGNOSIS — J9621 Acute and chronic respiratory failure with hypoxia: Secondary | ICD-10-CM

## 2024-12-24 LAB — RENAL FUNCTION PANEL
Albumin: 3.5 g/dL (ref 3.5–5.0)
Anion gap: 7 (ref 5–15)
BUN: 18 mg/dL (ref 8–23)
CO2: 37 mmol/L — ABNORMAL HIGH (ref 22–32)
Calcium: 9.8 mg/dL (ref 8.9–10.3)
Chloride: 96 mmol/L — ABNORMAL LOW (ref 98–111)
Creatinine, Ser: 0.71 mg/dL (ref 0.44–1.00)
GFR, Estimated: >60 mL/min
Glucose, Bld: 85 mg/dL (ref 70–99)
Phosphorus: 3 mg/dL (ref 2.5–4.6)
Potassium: 4.2 mmol/L (ref 3.5–5.1)
Sodium: 140 mmol/L (ref 135–145)

## 2024-12-24 LAB — CBC
HCT: 30.7 % — ABNORMAL LOW (ref 36.0–46.0)
Hemoglobin: 9.1 g/dL — ABNORMAL LOW (ref 12.0–15.0)
MCH: 27 pg (ref 26.0–34.0)
MCHC: 29.6 g/dL — ABNORMAL LOW (ref 30.0–36.0)
MCV: 91.1 fL (ref 80.0–100.0)
Platelets: 437 K/uL — ABNORMAL HIGH (ref 150–400)
RBC: 3.37 MIL/uL — ABNORMAL LOW (ref 3.87–5.11)
RDW: 14 % (ref 11.5–15.5)
WBC: 13.3 K/uL — ABNORMAL HIGH (ref 4.0–10.5)
nRBC: 0 % (ref 0.0–0.2)

## 2024-12-24 LAB — MAGNESIUM: Magnesium: 2.2 mg/dL (ref 1.7–2.4)

## 2024-12-24 LAB — GLUCOSE, CAPILLARY: Glucose-Capillary: 111 mg/dL — ABNORMAL HIGH (ref 70–99)

## 2024-12-24 MED ORDER — LOPERAMIDE HCL 2 MG PO CAPS
2.0000 mg | ORAL_CAPSULE | ORAL | 0 refills | Status: AC | PRN
  Filled 2024-12-24: qty 30, 7d supply, fill #0

## 2024-12-24 NOTE — Discharge Summary (Signed)
 " Physician Discharge Summary   Patient: Alexandra [name] MRN: [id number] DOB: 01/21/1941  Admit date:     12/21/2024  Discharge date: 12/24/2024  Discharge Physician: Carliss LELON Canales   PCP: Derick Leita POUR, MD   Recommendations at discharge:    Pt to be discharged home with home health.   If you experience worsening fever, chills, chest pain, shortness of breath, or other concerning symptoms, please call your PCP or go to the emergency department immediately.  Discharge Diagnoses: Principal Problem:   Sepsis due to pneumonia [facility]) Active Problems:   COPD with acute exacerbation (HCC)   Chronic obstructive pulmonary disease (COPD) (HCC)   Opioid dependence (HCC)   Chronic hypoxic respiratory failure (HCC)   Malignant neoplasm of upper lobe of right lung (HCC)   Anxiety and depression   GERD (gastroesophageal reflux disease)   Hyperlipidemia   Elevated blood pressure reading  Resolved Problems:   * No resolved hospital problems. *   Hospital Course:  84 y.o. female  with medical history of chronic hypoxic respiratory failure secondary to COPD on 2 L, history of lung cancer status post SBRT, hyperlipidemia, opioid use disorder, GAD, MDD, who presented to the hospital with worsening shortness of breath and cough productive of greenish sputum.  Symptoms had been going on for about 2 weeks.  She said she was given her first round of antibiotics without any improvement.  She was then given a second round of antibiotics (cefpodoxime and doxycycline).  She had taken this for 3 days but did not see any improvement.  She therefore reported to the ED for further evaluation.  She normally uses 2.5 L oxygen at home but she said she had had to go up to 3 L oxygen because of worsening shortness of breath.  She has a complaint of diarrhea for about 1 week duration since she started taking antibiotics.   Assessment and Plan:  Acute on chronic hypoxic respiratory failure - Patient requiring up to 4 L nasal  cannula, now weaned down to 2-3 L (patient's baseline).  Concern for sepsis secondary to commune acquired pneumonia - Failed outpatient antibiotic therapy.  Worsening cough, fever, tachypnea.  Leukocytosis and tachycardia on presentation.  Tolerated empiric ceftriaxone  plus azithromycin  as well as cefpodoxime and doxycycline prior to admission.  At this point has completed antibiotic therapy.  No need for continued antibiotics upon discharge.  COPD exacerbation - Prednisone  discontinued.  Continue bronchodilators.  Acute diarrheal illness - Likely secondary to antibiotic use.  Imodium  prescription provided.  Hypokalemia - Resolved after replenishment.  History of right lung cancer s/p radiation therapy - Follows with oncology in the outpatient setting.  Physical debilitation muscle weakness - Recommending home health PT.  Also provide prescription for RN DME rolling walker.  Consultants: None Procedures performed: None Disposition: Home health Diet recommendation:  Cardiac diet  DISCHARGE MEDICATION: Allergies as of 12/24/2024       Reactions   Avelox [moxifloxacin Hcl] Shortness Of Breath, Swelling   Moxifloxacin Anaphylaxis   Sulfa Antibiotics Anaphylaxis   Cefadroxil Itching   Elemental Sulfur Other (See Comments)   Reaction as a child and not sure what.         Medication List     STOP taking these medications    Breztri Aerosphere 160-9-4.8 MCG/ACT Aero inhaler Generic drug: budesonide -glycopyrrolate -formoterol   cefpodoxime 200 MG tablet Commonly known as: VANTIN   doxycycline 100 MG tablet Commonly known as: ADOXA       TAKE these  medications    albuterol  108 (90 Base) MCG/ACT inhaler Commonly known as: VENTOLIN  HFA Inhale 1-2 puffs into the lungs every 6 (six) hours as needed for wheezing or shortness of breath.   albuterol  (2.5 MG/3ML) 0.083% nebulizer solution Commonly known as: PROVENTIL  Take 3 mLs (2.5 mg total) by nebulization every 6 (six)  hours as needed for Wheezing   amitriptyline  50 MG tablet Commonly known as: ELAVIL  Take 50 mg by mouth at bedtime.   aspirin  81 MG chewable tablet Chew 81 mg by mouth daily.   benzonatate 200 MG capsule Commonly known as: TESSALON Take 200 mg by mouth 3 (three) times daily as needed for cough.   celecoxib  200 MG capsule Commonly known as: CELEBREX  Take 200 mg by mouth 2 (two) times daily.   cholecalciferol  25 MCG (1000 UNIT) tablet Commonly known as: VITAMIN D3 Take 1,000 Units by mouth daily.   CYCLOBENZAPRINE HCL PO Take 1 tablet by mouth as needed.   diazepam  5 MG tablet Commonly known as: VALIUM  Take 1 tablet (5 mg) 30-60 minutes prior to Imaging.   gabapentin  600 MG tablet Commonly known as: NEURONTIN  Take 1,200 mg by mouth 3 (three) times daily.   HYDROcodone -acetaminophen  10-325 MG tablet Commonly known as: NORCO Take 1 tablet by mouth 4 (four) times daily as needed for moderate pain (pain score 4-6) or severe pain (pain score 7-10).   latanoprost  0.005 % ophthalmic solution Commonly known as: XALATAN  Place 1 drop into both eyes at bedtime.   loperamide  2 MG capsule Commonly known as: IMODIUM  Take 1 capsule (2 mg total) by mouth as needed for diarrhea or loose stools.   OXYGEN Inhale 2 L into the lungs continuous.   pantoprazole  40 MG tablet Commonly known as: PROTONIX  Take 40 mg by mouth 2 (two) times daily.   sertraline  25 MG tablet Commonly known as: ZOLOFT  Take 25 mg by mouth daily.   simvastatin  40 MG tablet Commonly known as: ZOCOR  Take 40 mg by mouth every evening.   Trelegy Ellipta 200-62.5-25 MCG/ACT Aepb Generic drug: Fluticasone -Umeclidin-Vilant Inhale 1 puff into the lungs daily.   zolpidem  10 MG tablet Commonly known as: AMBIEN  Take 0.5 tablets (5 mg total) by mouth at bedtime.               Durable Medical Equipment  (From admission, onward)           Start     Ordered   12/24/24 1012  For home use only DME  Walker rolling  Once       Question Answer Comment  Walker: With 5 Inch Wheels   Patient needs a walker to treat with the following condition Weakness      12/24/24 1011            Contact information for after-discharge care     Home Medical Care     [facility] [place] .   Service: Home Health Services Contact information: [phone] [address] [place]  [address] [phone]                     Discharge Exam: Fredricka Weights   12/21/24 1330 12/23/24 0500 12/24/24 0522  Weight: 59 kg 60.8 kg 61 kg    GENERAL:  Alert, pleasant, no acute distress  HEENT:  EOMI, nasal cannula CARDIOVASCULAR:  RRR, no murmurs appreciated RESPIRATORY:  Clear to auscultation, no wheezing, rales, or rhonchi GASTROINTESTINAL:  Soft, nontender, nondistended EXTREMITIES:  No LE edema bilaterally NEURO:  No new focal deficits appreciated SKIN:  No rashes noted PSYCH:  Appropriate mood and affect     Condition at discharge: improving  The results of significant diagnostics from this hospitalization (including imaging, microbiology, ancillary and laboratory) are listed below for reference.   Imaging Studies: US  Venous Img Lower Unilateral Left (DVT) Result Date: 12/22/2024 CLINICAL DATA:  84 year old female with left lower extremity swelling. EXAM: LEFT LOWER EXTREMITY VENOUS DOPPLER ULTRASOUND TECHNIQUE: Gray-scale sonography with graded compression, as well as color Doppler and duplex ultrasound were performed to evaluate the left lower extremity deep venous systems from the level of the common femoral vein and including the common femoral, femoral, profunda femoral, popliteal and calf veins including the posterior tibial, peroneal and gastrocnemius veins when visible. Spectral Doppler was utilized to evaluate flow at rest and with distal augmentation maneuvers in the common femoral, femoral and popliteal veins. The contralateral common femoral vein was also evaluated for  comparison. COMPARISON:  None Available. FINDINGS: LEFT LOWER EXTREMITY Common Femoral Vein: No evidence of thrombus. Normal compressibility, respiratory phasicity and response to augmentation. Central Greater Saphenous Vein: No evidence of thrombus. Normal compressibility and flow on color Doppler imaging. Central Profunda Femoral Vein: No evidence of thrombus. Normal compressibility and flow on color Doppler imaging. Femoral Vein: No evidence of thrombus. Normal compressibility, respiratory phasicity and response to augmentation. Popliteal Vein: No evidence of thrombus. Normal compressibility, respiratory phasicity and response to augmentation. Calf Veins: No evidence of thrombus. Normal compressibility and flow on color Doppler imaging. Other Findings:  None. RIGHT LOWER EXTREMITY Common Femoral Vein: No evidence of thrombus. Normal compressibility, respiratory phasicity and response to augmentation. IMPRESSION: No evidence of left lower extremity deep venous thrombosis. Ester Sides, MD [facility] Electronically Signed   By: Ester Sides M.D.   On: 12/22/2024 10:59   DG Chest 2 View Result Date: 12/21/2024 EXAM: 2 VIEW(S) XRAY OF THE CHEST 12/21/2024 01:45:00 PM COMPARISON: 10/27/2023 CLINICAL HISTORY: sob FINDINGS: LUNGS AND PLEURA: Biapical scarring. Right apical mass/post-radiation change. Increased bibasilar atelectasis or infiltrates . Trace bilateral pleural effusions. No pneumothorax. HEART AND MEDIASTINUM: No acute abnormality of the cardiac and mediastinal silhouettes. BONES AND SOFT TISSUES: No acute osseous abnormality. IMPRESSION: 1. Increased bibasilar atelectasis or infiltrates with trace bilateral pleural effusions. 2. Right apical mass/post-radiation change. Electronically signed by: Norman Gatlin MD 12/21/2024 01:58 PM EST RP Workstation: [id number]    Microbiology: Results for orders placed or performed during the hospital  encounter of 12/21/24  Resp panel by RT-PCR (RSV, Flu A&B, Covid) Anterior Nasal Swab     Status: None   Collection Time: 12/21/24  4:10 PM   Specimen: Anterior Nasal Swab  Result Value Ref Range Status   SARS Coronavirus 2 by RT PCR NEGATIVE NEGATIVE Final    Comment: (NOTE) SARS-CoV-2 target nucleic acids are NOT DETECTED.  The SARS-CoV-2 RNA is generally detectable in upper respiratory specimens during the acute phase of infection. The lowest concentration of SARS-CoV-2 viral copies this assay can detect is 138 copies/mL. A negative result does not preclude SARS-Cov-2 infection and should not be used as the sole basis for treatment or other patient management decisions. A negative result may occur with  improper specimen collection/handling, submission of specimen other than nasopharyngeal swab, presence of viral mutation(s) within the areas targeted by this assay, and inadequate number of viral copies(<138 copies/mL). A negative result must be combined with clinical observations, patient history, and epidemiological information. The expected result is Negative.  Fact Sheet for Patients:  bloggercourse.com  Fact Sheet for Healthcare Providers:  seriousbroker.it  This test is no t yet approved or cleared by the [place]  FDA and  has been authorized for detection and/or diagnosis of SARS-CoV-2 by FDA under an Emergency Use Authorization (EUA). This EUA will remain  in effect (meaning this test can be used) for the duration of the COVID-19 declaration under Section 564(b)(1) of the Act, 21 U.S.C.section 360bbb-3(b)(1), unless the authorization is terminated  or revoked sooner.       Influenza A by PCR NEGATIVE NEGATIVE Final   Influenza B by PCR NEGATIVE NEGATIVE Final    Comment: (NOTE) The Xpert Xpress SARS-CoV-2/FLU/RSV plus assay is intended as an aid in the diagnosis of influenza from Nasopharyngeal swab specimens  and should not be used as a sole basis for treatment. Nasal washings and aspirates are unacceptable for Xpert Xpress SARS-CoV-2/FLU/RSV testing.  Fact Sheet for Patients: bloggercourse.com  Fact Sheet for Healthcare Providers: seriousbroker.it  This test is not yet approved or cleared by the [place]  FDA and has been authorized for detection and/or diagnosis of SARS-CoV-2 by FDA under an Emergency Use Authorization (EUA). This EUA will remain in effect (meaning this test can be used) for the duration of the COVID-19 declaration under Section 564(b)(1) of the Act, 21 U.S.C. section 360bbb-3(b)(1), unless the authorization is terminated or revoked.     Resp Syncytial Virus by PCR NEGATIVE NEGATIVE Final    Comment: (NOTE) Fact Sheet for Patients: bloggercourse.com  Fact Sheet for Healthcare Providers: seriousbroker.it  This test is not yet approved or cleared by the [place]  FDA and has been authorized for detection and/or diagnosis of SARS-CoV-2 by FDA under an Emergency Use Authorization (EUA). This EUA will remain in effect (meaning this test can be used) for the duration of the COVID-19 declaration under Section 564(b)(1) of the Act, 21 U.S.C. section 360bbb-3(b)(1), unless the authorization is terminated or revoked.  Performed at [facility], [address] [place], [place] [address]   Blood culture (routine x 2)     Status: None (Preliminary result)   Collection Time: 12/21/24  4:10 PM   Specimen: BLOOD  Result Value Ref Range Status   Specimen Description BLOOD BLOOD RIGHT ARM  Final   Special Requests   Final    BOTTLES DRAWN AEROBIC AND ANAEROBIC Blood Culture adequate volume   Culture   Final    NO GROWTH 3 DAYS Performed at [facility], [address] [place], [place] [address]    Report Status PENDING  Incomplete  Blood  culture (routine x 2)     Status: None (Preliminary result)   Collection Time: 12/21/24  4:10 PM   Specimen: BLOOD  Result Value Ref Range Status   Specimen Description BLOOD BLOOD RIGHT ARM  Final   Special Requests   Final    BOTTLES DRAWN AEROBIC AND ANAEROBIC Blood Culture results may not be optimal due to an inadequate volume of blood received in culture bottles   Culture   Final    NO GROWTH 3 DAYS Performed at [facility], [address] [place], [place] [address]    Report Status PENDING  Incomplete    Labs: CBC: Recent Labs  Lab 12/21/24 1331 12/22/24 0503 12/23/24 0424 12/24/24 0430  WBC 17.3* 10.6* 19.4* 13.3*  HGB 8.8* 8.3* 8.7* 9.1*  HCT 29.6* 28.0* 29.5* 30.7*  MCV 90.8 90.3 91.6 91.1  PLT 378 362 513* 437*  Basic Metabolic Panel: Recent Labs  Lab 12/21/24 1331 12/21/24 1713 12/22/24 0502 12/22/24 0503 12/23/24 0422 12/23/24 0424 12/24/24 0430  NA 140  --   --  140  --  141 140  K 4.2  --   --  3.9  --  3.4* 4.2  CL 95*  --   --  97*  --  94* 96*  CO2 35*  --   --  34*  --  38* 37*  GLUCOSE 114*  --   --  142*  --  108* 85  BUN 9  --   --  12  --  15 18  CREATININE 0.64  --   --  0.64  --  0.79 0.71  CALCIUM 9.4  --   --  9.7  --  9.5 9.8  MG  --  1.9 1.9  --  2.0  --  2.2  PHOS  --   --  3.6  --  3.8  --  3.0   Liver Function Tests: Recent Labs  Lab 12/24/24 0430  ALBUMIN 3.5   CBG: Recent Labs  Lab 12/22/24 0752 12/23/24 0802 12/24/24 0743  GLUCAP 127* 89 111*    Discharge time spent: 26 minutes.  Length of inpatient stay: 2 days  Signed: Carliss LELON Canales, DO [facility] 12/24/2024         "

## 2024-12-24 NOTE — TOC Progression Note (Signed)
 Transition of Care [facility]) - Progression Note    Patient Details  Name: Alexandra [name] MRN: [id number] Date of Birth: 29-Jun-1941  Transition of Care [facility]) CM/SW Contact  Lauraine JAYSON Carpen, LCSW Phone Number: 12/24/2024, 10:20 AM  Clinical Narrative:  Per PT, patient now wants a RW. CSW ordered through [employer].   Expected Discharge Plan: Home w Home Health Services Barriers to Discharge: Continued Medical Work up               Expected Discharge Plan and Services   Discharge Planning Services: CM Consult                                           Social Drivers of Health (SDOH) Interventions SDOH Screenings   Food Insecurity: No Food Insecurity (12/22/2024)  Housing: Low Risk (12/22/2024)  Transportation Needs: No Transportation Needs (12/22/2024)  Utilities: Not At Risk (12/22/2024)  Alcohol Screen: Low Risk (03/27/2023)  Depression (PHQ2-9): Low Risk (07/17/2024)  Financial Resource Strain: Low Risk  (02/19/2024)   Received [facility]  Physical Activity: Inactive (03/27/2023)  Social Connections: Socially Isolated (12/22/2024)  Stress: No Stress Concern Present (03/27/2023)  Tobacco Use: Medium Risk (12/21/2024)    Readmission Risk Interventions     No data to display

## 2024-12-24 NOTE — TOC Transition Note (Addendum)
 Transition of Care [facility]) - Discharge Note   Patient Details  Name: Alexandra [name] MRN: [id number] Date of Birth: Apr 26, 1941  Transition of Care [facility]) CM/SW Contact:  Lauraine JAYSON Carpen, LCSW Phone Number: 12/24/2024, 10:34 AM   Clinical Narrative:   Patient has orders to discharge home today. [facility] liaison is aware. No further concerns. CSW signing off.  11:26 am: Per [employer], patient received a RW in 2023 and does not want to pay privately for another one. Sent secure chat to RN to notify.  Final next level of care: Home w Home Health Services Barriers to Discharge: Barriers Resolved   Patient Goals and CMS Choice   CMS Medicare.gov Compare Post Acute Care list provided to:: Patient Choice offered to / list presented to : Patient      Discharge Placement                Patient to be transferred to facility by: Daughter   Patient and family notified of of transfer: 12/24/24  Discharge Plan and Services Additional resources added to the After Visit Summary for     Discharge Planning Services: CM Consult            DME Arranged: Vannie rolling DME Agency: [employer] Date DME Agency Contacted: 12/24/24   Representative spoke with at DME Agency: [place] HH Arranged: RN, PT [facility] Agency: [facility] Date HH Agency Contacted: 12/24/24   Representative spoke with at [facility] Agency: [place]  Social Drivers of Health (SDOH) Interventions SDOH Screenings   Food Insecurity: No Food Insecurity (12/22/2024)  Housing: Low Risk (12/22/2024)  Transportation Needs: No Transportation Needs (12/22/2024)  Utilities: Not At Risk (12/22/2024)  Alcohol Screen: Low Risk (03/27/2023)  Depression (PHQ2-9): Low Risk (07/17/2024)  Financial Resource Strain: Low Risk  (02/19/2024)   Received from [facility]  Physical Activity: Inactive (03/27/2023)  Social Connections: Socially Isolated (12/22/2024)  Stress: No Stress Concern Present (03/27/2023)  Tobacco Use:  Medium Risk (12/21/2024)     Readmission Risk Interventions     No data to display

## 2024-12-25 ENCOUNTER — Other Ambulatory Visit: Payer: Self-pay

## 2024-12-25 ENCOUNTER — Emergency Department

## 2024-12-25 ENCOUNTER — Inpatient Hospital Stay
Admission: EM | Admit: 2024-12-25 | Discharge: 2024-12-31 | DRG: 641 | Disposition: A | Discharge status: Home Health | Attending: Internal Medicine | Admitting: Internal Medicine

## 2024-12-25 DIAGNOSIS — Z87891 Personal history of nicotine dependence: Secondary | ICD-10-CM

## 2024-12-25 DIAGNOSIS — Z882 Allergy status to sulfonamides status: Secondary | ICD-10-CM

## 2024-12-25 DIAGNOSIS — Z8619 Personal history of other infectious and parasitic diseases: Secondary | ICD-10-CM

## 2024-12-25 DIAGNOSIS — F1120 Opioid dependence, uncomplicated: Secondary | ICD-10-CM | POA: Diagnosis present

## 2024-12-25 DIAGNOSIS — F419 Anxiety disorder, unspecified: Secondary | ICD-10-CM | POA: Diagnosis present

## 2024-12-25 DIAGNOSIS — S92232D Displaced fracture of intermediate cuneiform of left foot, subsequent encounter for fracture with routine healing: Secondary | ICD-10-CM

## 2024-12-25 DIAGNOSIS — E785 Hyperlipidemia, unspecified: Secondary | ICD-10-CM | POA: Diagnosis not present

## 2024-12-25 DIAGNOSIS — E860 Dehydration: Principal | ICD-10-CM | POA: Diagnosis present

## 2024-12-25 DIAGNOSIS — S92312A Displaced fracture of first metatarsal bone, left foot, initial encounter for closed fracture: Secondary | ICD-10-CM | POA: Diagnosis present

## 2024-12-25 DIAGNOSIS — I2489 Other forms of acute ischemic heart disease: Secondary | ICD-10-CM | POA: Diagnosis present

## 2024-12-25 DIAGNOSIS — N179 Acute kidney failure, unspecified: Secondary | ICD-10-CM | POA: Diagnosis not present

## 2024-12-25 DIAGNOSIS — R197 Diarrhea, unspecified: Secondary | ICD-10-CM | POA: Diagnosis not present

## 2024-12-25 DIAGNOSIS — Z888 Allergy status to other drugs, medicaments and biological substances status: Secondary | ICD-10-CM

## 2024-12-25 DIAGNOSIS — M79672 Pain in left foot: Secondary | ICD-10-CM | POA: Diagnosis present

## 2024-12-25 DIAGNOSIS — W19XXXA Unspecified fall, initial encounter: Secondary | ICD-10-CM | POA: Diagnosis present

## 2024-12-25 DIAGNOSIS — Y92009 Unspecified place in unspecified non-institutional (private) residence as the place of occurrence of the external cause: Secondary | ICD-10-CM

## 2024-12-25 DIAGNOSIS — J439 Emphysema, unspecified: Secondary | ICD-10-CM | POA: Diagnosis present

## 2024-12-25 DIAGNOSIS — C3411 Malignant neoplasm of upper lobe, right bronchus or lung: Secondary | ICD-10-CM | POA: Diagnosis not present

## 2024-12-25 DIAGNOSIS — K219 Gastro-esophageal reflux disease without esophagitis: Secondary | ICD-10-CM | POA: Diagnosis present

## 2024-12-25 DIAGNOSIS — Z90710 Acquired absence of both cervix and uterus: Secondary | ICD-10-CM

## 2024-12-25 DIAGNOSIS — G8929 Other chronic pain: Secondary | ICD-10-CM | POA: Diagnosis present

## 2024-12-25 DIAGNOSIS — F32A Depression, unspecified: Secondary | ICD-10-CM | POA: Diagnosis not present

## 2024-12-25 DIAGNOSIS — I951 Orthostatic hypotension: Secondary | ICD-10-CM | POA: Diagnosis present

## 2024-12-25 DIAGNOSIS — Z7982 Long term (current) use of aspirin: Secondary | ICD-10-CM

## 2024-12-25 DIAGNOSIS — R55 Syncope and collapse: Secondary | ICD-10-CM

## 2024-12-25 DIAGNOSIS — Z79899 Other long term (current) drug therapy: Secondary | ICD-10-CM

## 2024-12-25 DIAGNOSIS — Z1152 Encounter for screening for COVID-19: Secondary | ICD-10-CM

## 2024-12-25 DIAGNOSIS — J449 Chronic obstructive pulmonary disease, unspecified: Secondary | ICD-10-CM | POA: Diagnosis present

## 2024-12-25 DIAGNOSIS — Z8249 Family history of ischemic heart disease and other diseases of the circulatory system: Secondary | ICD-10-CM

## 2024-12-25 DIAGNOSIS — D509 Iron deficiency anemia, unspecified: Secondary | ICD-10-CM | POA: Diagnosis present

## 2024-12-25 DIAGNOSIS — Z8701 Personal history of pneumonia (recurrent): Secondary | ICD-10-CM

## 2024-12-25 DIAGNOSIS — J9611 Chronic respiratory failure with hypoxia: Secondary | ICD-10-CM | POA: Diagnosis present

## 2024-12-25 DIAGNOSIS — Z923 Personal history of irradiation: Secondary | ICD-10-CM

## 2024-12-25 DIAGNOSIS — D649 Anemia, unspecified: Secondary | ICD-10-CM | POA: Diagnosis present

## 2024-12-25 DIAGNOSIS — Z9981 Dependence on supplemental oxygen: Secondary | ICD-10-CM

## 2024-12-25 DIAGNOSIS — I5A Non-ischemic myocardial injury (non-traumatic): Secondary | ICD-10-CM | POA: Diagnosis present

## 2024-12-25 LAB — COMPREHENSIVE METABOLIC PANEL WITH GFR
ALT: 12 U/L (ref 0–44)
AST: 13 U/L — ABNORMAL LOW (ref 15–41)
Albumin: 3.7 g/dL (ref 3.5–5.0)
Alkaline Phosphatase: 182 U/L — ABNORMAL HIGH (ref 38–126)
Anion gap: 14 (ref 5–15)
BUN: 32 mg/dL — ABNORMAL HIGH (ref 8–23)
CO2: 31 mmol/L (ref 22–32)
Calcium: 9.3 mg/dL (ref 8.9–10.3)
Chloride: 93 mmol/L — ABNORMAL LOW (ref 98–111)
Creatinine, Ser: 1.67 mg/dL — ABNORMAL HIGH (ref 0.44–1.00)
GFR, Estimated: 30 mL/min — ABNORMAL LOW
Glucose, Bld: 169 mg/dL — ABNORMAL HIGH (ref 70–99)
Potassium: 3.7 mmol/L (ref 3.5–5.1)
Sodium: 137 mmol/L (ref 135–145)
Total Bilirubin: 0.3 mg/dL (ref 0.0–1.2)
Total Protein: 7.7 g/dL (ref 6.5–8.1)

## 2024-12-25 LAB — CBC WITH DIFFERENTIAL/PLATELET
Abs Immature Granulocytes: 0.27 K/uL — ABNORMAL HIGH (ref 0.00–0.07)
Basophils Absolute: 0 K/uL (ref 0.0–0.1)
Basophils Relative: 0 %
Eosinophils Absolute: 0.3 K/uL (ref 0.0–0.5)
Eosinophils Relative: 2 %
HCT: 32.8 % — ABNORMAL LOW (ref 36.0–46.0)
Hemoglobin: 9.7 g/dL — ABNORMAL LOW (ref 12.0–15.0)
Immature Granulocytes: 2 %
Lymphocytes Relative: 4 %
Lymphs Abs: 0.8 K/uL (ref 0.7–4.0)
MCH: 26.7 pg (ref 26.0–34.0)
MCHC: 29.6 g/dL — ABNORMAL LOW (ref 30.0–36.0)
MCV: 90.4 fL (ref 80.0–100.0)
Monocytes Absolute: 1.2 K/uL — ABNORMAL HIGH (ref 0.1–1.0)
Monocytes Relative: 7 %
Neutro Abs: 16 K/uL — ABNORMAL HIGH (ref 1.7–7.7)
Neutrophils Relative %: 85 %
Platelets: 520 K/uL — ABNORMAL HIGH (ref 150–400)
RBC: 3.63 MIL/uL — ABNORMAL LOW (ref 3.87–5.11)
RDW: 14 % (ref 11.5–15.5)
WBC: 18.6 K/uL — ABNORMAL HIGH (ref 4.0–10.5)
nRBC: 0 % (ref 0.0–0.2)

## 2024-12-25 LAB — TROPONIN T, HIGH SENSITIVITY: Troponin T High Sensitivity: 28 ng/L — ABNORMAL HIGH (ref 0–19)

## 2024-12-25 MED ORDER — DM-GUAIFENESIN ER 30-600 MG PO TB12: 1.0000 | ORAL_TABLET | Freq: Two times a day (BID) | ORAL | Status: DC | PRN

## 2024-12-25 MED ORDER — ONDANSETRON HCL 4 MG/2ML IJ SOLN: 4.0000 mg | Freq: Three times a day (TID) | INTRAMUSCULAR | Status: DC | PRN

## 2024-12-25 MED ORDER — SODIUM CHLORIDE 0.9 % IV BOLUS
1000.0000 mL | Freq: Once | INTRAVENOUS | Status: AC
  Administered 2024-12-25: 1000 mL via INTRAVENOUS

## 2024-12-25 MED ORDER — HYDROCODONE-ACETAMINOPHEN 10-325 MG PO TABS
1.0000 | ORAL_TABLET | Freq: Four times a day (QID) | ORAL | Status: DC | PRN
  Administered 2024-12-26 – 2024-12-31 (×12): 1 via ORAL
  Filled 2024-12-25 (×12): qty 1

## 2024-12-25 MED ORDER — ALBUTEROL SULFATE (2.5 MG/3ML) 0.083% IN NEBU: 2.5000 mg | INHALATION_SOLUTION | RESPIRATORY_TRACT | Status: DC | PRN

## 2024-12-25 MED ORDER — SODIUM CHLORIDE 0.9 % IV SOLN: INTRAVENOUS | Status: DC

## 2024-12-25 MED ORDER — ALBUTEROL SULFATE HFA 108 (90 BASE) MCG/ACT IN AERS: 2.0000 | INHALATION_SPRAY | RESPIRATORY_TRACT | Status: DC | PRN

## 2024-12-25 MED ORDER — IPRATROPIUM-ALBUTEROL 0.5-2.5 (3) MG/3ML IN SOLN
3.0000 mL | RESPIRATORY_TRACT | Status: DC
  Administered 2024-12-26: 3 mL via RESPIRATORY_TRACT
  Filled 2024-12-25: qty 3

## 2024-12-25 MED ORDER — ACETAMINOPHEN 325 MG PO TABS
650.0000 mg | ORAL_TABLET | Freq: Four times a day (QID) | ORAL | Status: DC | PRN
  Administered 2024-12-27 – 2024-12-29 (×3): 650 mg via ORAL
  Filled 2024-12-25 (×4): qty 2

## 2024-12-25 NOTE — ED Triage Notes (Addendum)
 Pt to ED via [facility] from home for c/o two witnessed falls. Per family, pt had syncopal episode prior to falls. Pt had another syncopal episode after EMS arrived. Pt denies hitting head, denies thinners. Hematoma noted on top of left foot. Pt discharged from this facility four days ago. Hx COPD and lung cancer.   Pt on 4L O2.

## 2024-12-25 NOTE — H&P (Addendum)
 " History and Physical    [name]:[id number] DOB: 1941/04/06 DOA: 12/25/2024  Referring MD/NP/PA:   PCP: Derick Leita POUR, MD   Patient coming from:  The patient is coming from home.     Chief Complaint: syncope and diarrhea  HPI: Alexandra [name] is a 84 y.o. female with medical history significant of [name], COPD on 2-3 L O2, depression with anxiety, anemia, right lung cancer (s/p of radiation therapy), idiopathic neuropathy, opioid dependence, perforated colon, who presents with syncope and diarrhea.  Patient was recently hospitalized from 1/4 to 1/7 due to sepsis secondary to pneumonia and COPD exacerbation.  Patient completed course of Rocephin  and azithromycin .  Patient also had diarrhea which was thought to be related to antibiotic use.  Patient is on Imodium  currently. She states that she continues to have diarrhea, with 4-5 times of watery diarrhea each day.  She has nausea, no vomiting or abdominal pain. She has lightheadedness.  She states that she passed out when she was walking to the bathroom today. She injured her left foot from fall, causing bruise to the dorsal aspects of left foot.  She has moderate pain in the left foot.  Denies headache or neck pain.  She has mild SOB and dry cough, no chest pain.  No fever or chills.  No symptoms of UTI.  Data reviewed independently and ED Course: pt was found to have WBC 18.6, AKI with creatinine 1.67, BUN 32 and eGFR 30 (recent baseline creatinine 0.71), troponin 28, temperature 97.5, blood pressure 128/59, heart rate 106 --> 76, RR 22, oxygen saturation 97% on 4 L oxygen.  Patient is placed in telemetry bed for observation.   Chest x-ray: 1. Emphysema and suspected chronic scarring at the right base. 2. Stable right apical pleuroparenchymal opacity, reference chest CT from October  X-ray of the left foot: 1. Abnormal alignment at the base of the first metatarsal and medial cuneiform concerning for Lisfranc injury, although congenital  coalition of the medial and middle cuneiforms could account for this appearance. Possible fracture at the base of the 2nd metatarsal bone. Suggest CT or MRI for further evaluation. 2. Dorsal forefoot subcutaneous soft tissue edema.    EKG: I have personally reviewed.  Sinus rhythm, QTc 490, bilateral atrial enlargement, nonspecific T wave change.   Review of Systems:   General: no fevers, chills, no body weight gain, has fatigue HEENT: no blurry vision, hearing changes or sore throat Respiratory: has dyspnea, coughing, no wheezing CV: no chest pain, no palpitations GI: Has diarrhea and nausea, no vomiting or abdominal pain. GU: no dysuria, burning on urination, increased urinary frequency, hematuria  Ext: no leg edema Neuro: no unilateral weakness, numbness, or tingling, no vision change or hearing loss. Has  syncope and fall. Skin: no rash, no skin tear. MSK: No muscle spasm, no deformity, no limitation of range of movement in spin. Has left foot pain Heme: No easy bruising.  Travel history: No recent long distant travel.   Allergy: Allergies[1]  Past Medical History:  Diagnosis Date   Anxiety    COPD (chronic obstructive pulmonary disease) (HCC)    Depression    GERD (gastroesophageal reflux disease)    Hyperlipidemia    Pulmonary nodules/lesions, multiple    Right femoral fracture [facility])     Past Surgical History:  Procedure Laterality Date   ABDOMINAL HYSTERECTOMY  1975   COLONOSCOPY     COLOSTOMY  2012   repaired and reduced in 2013   intestinal  rupture  2012   INTRAMEDULLARY (IM) NAIL INTERTROCHANTERIC Right 12/17/2021   Procedure: INTRAMEDULLARY (IM) NAIL INTERTROCHANTRIC;  Surgeon: Rollene Cough, MD;  Location: [facility] ORS;  Service: Orthopedics;  Laterality: Right;   TAKE DOWN OF INTESTINAL FISTULA  2013   TUMOR EXCISION  2013   hospitalized and drained.    VIDEO BRONCHOSCOPY WITH ENDOBRONCHIAL ULTRASOUND N/A 03/19/2023   Procedure: VIDEO BRONCHOSCOPY WITH  ENDOBRONCHIAL ULTRASOUND;  Surgeon: Parris Manna, MD;  Location: [facility] ORS;  Service: Thoracic;  Laterality: N/A;    Social History:  reports that she quit smoking about 14 years ago. Her smoking use included cigarettes. She started smoking about 51 years ago. She has a 37 pack-year smoking history. She has never used smokeless tobacco. She reports that she does not drink alcohol and does not use drugs.  Family History:  Family History  Problem Relation Age of Onset   Heart disease Mother    Heart disease Father      Prior to Admission medications  Medication Sig Start Date End Date Taking? Authorizing Provider  albuterol  (PROVENTIL ) (2.5 MG/3ML) 0.083% nebulizer solution Take 3 mLs (2.5 mg total) by nebulization every 6 (six) hours as needed for Wheezing 10/22/23 12/21/24  [provider]  albuterol  (VENTOLIN  HFA) 108 (90 Base) MCG/ACT inhaler Inhale 1-2 puffs into the lungs every 6 (six) hours as needed for wheezing or shortness of breath.    [provider]  amitriptyline  (ELAVIL ) 50 MG tablet Take 50 mg by mouth at bedtime.    [provider]  aspirin  81 MG chewable tablet Chew 81 mg by mouth daily.    [provider]  benzonatate (TESSALON) 200 MG capsule Take 200 mg by mouth 3 (three) times daily as needed for cough. 10/16/23   [provider]  celecoxib  (CELEBREX ) 200 MG capsule Take 200 mg by mouth 2 (two) times daily.    [provider]  cholecalciferol  (VITAMIN D3) 25 MCG (1000 UNIT) tablet Take 1,000 Units by mouth daily.    [provider]  CYCLOBENZAPRINE  HCL PO Take 1 tablet by mouth as needed.    [provider]  diazepam  (VALIUM ) 5 MG tablet Take 1 tablet (5 mg) 30-60 minutes prior to Imaging. 07/17/24   Dasie Tinnie MATSU, NP  gabapentin  (NEURONTIN ) 600 MG tablet Take 1,200 mg by mouth 3 (three) times daily.    [provider]  HYDROcodone -acetaminophen  (NORCO) 10-325 MG tablet Take 1 tablet by mouth  4 (four) times daily as needed for moderate pain (pain score 4-6) or severe pain (pain score 7-10).    [provider]  latanoprost  (XALATAN ) 0.005 % ophthalmic solution Place 1 drop into both eyes at bedtime.    [provider]  loperamide  (IMODIUM ) 2 MG capsule Take 1 capsule (2 mg total) by mouth as needed for diarrhea or loose stools. 12/24/24   Arlon Carliss ORN, DO  OXYGEN Inhale 2 L into the lungs continuous.    [provider]  pantoprazole  (PROTONIX ) 40 MG tablet Take 40 mg by mouth 2 (two) times daily.    [provider]  sertraline  (ZOLOFT ) 25 MG tablet Take 25 mg by mouth daily. 10/03/24   [provider]  simvastatin  (ZOCOR ) 40 MG tablet Take 40 mg by mouth every evening.    [provider]  DOMINIC BECK 200-62.5-25 MCG/ACT AEPB Inhale 1 puff into the lungs daily. 09/18/23   [provider]  zolpidem  (AMBIEN ) 10 MG tablet Take 0.5 tablets (5 mg total) by  mouth at bedtime. 12/24/21   Noralee Elidia Sieving, MD    Physical Exam: Vitals:   12/25/24 2008 12/25/24 2012 12/26/24 0003 12/26/24 0006  BP: (!) 128/59  (!) 158/63   Pulse: 76  86 81  Resp: (!) 22  18   Temp: (!) 97.5 F (36.4 C)  97.7 F (36.5 C)   TempSrc: Oral  Oral   SpO2: 97%  (!) 84% 90%  Weight:  59.9 kg    Height:  5' 8 (1.727 m)     General: Not in acute distress.  Dry mucous membrane HEENT:       Eyes: PERRL, EOMI, no jaundice       ENT: No discharge from the ears and nose, no pharynx injection, no tonsillar enlargement.        Neck: No JVD, no bruit, no mass felt. Heme: No neck lymph node enlargement. Cardiac: S1/S2, RRR, No murmurs, No gallops or rubs. Respiratory: No rales, wheezing, rhonchi or rubs. GI: Soft, nondistended, nontender, no rebound pain, no organomegaly, BS present. GU: No hematuria Ext: No pitting leg edema bilaterally. 1+DP/PT pulse bilaterally. Has bruise to the dorsal aspects of the left foot.  Has left foot  tenderness. Musculoskeletal: No joint deformities, No joint redness or warmth, no limitation of ROM in spin. Skin: No rashes.  Neuro: Alert, oriented X3, cranial nerves II-XII grossly intact, moves all extremities normally. Psych: Patient is not psychotic, no suicidal or hemocidal ideation.  Labs on Admission: I have personally reviewed following labs and imaging studies  CBC: Recent Labs  Lab 12/21/24 1331 12/22/24 0503 12/23/24 0424 12/24/24 0430 12/25/24 2055  WBC 17.3* 10.6* 19.4* 13.3* 18.6*  NEUTROABS  --   --   --   --  16.0*  HGB 8.8* 8.3* 8.7* 9.1* 9.7*  HCT 29.6* 28.0* 29.5* 30.7* 32.8*  MCV 90.8 90.3 91.6 91.1 90.4  PLT 378 362 513* 437* 520*   Basic Metabolic Panel: Recent Labs  Lab 12/21/24 1331 12/21/24 1713 12/22/24 0502 12/22/24 0503 12/23/24 0422 12/23/24 0424 12/24/24 0430 12/25/24 2055  NA 140  --   --  140  --  141 140 137  K 4.2  --   --  3.9  --  3.4* 4.2 3.7  CL 95*  --   --  97*  --  94* 96* 93*  CO2 35*  --   --  34*  --  38* 37* 31  GLUCOSE 114*  --   --  142*  --  108* 85 169*  BUN 9  --   --  12  --  15 18 32*  CREATININE 0.64  --   --  0.64  --  0.79 0.71 1.67*  CALCIUM 9.4  --   --  9.7  --  9.5 9.8 9.3  MG  --  1.9 1.9  --  2.0  --  2.2  --   PHOS  --   --  3.6  --  3.8  --  3.0  --    GFR: Estimated Creatinine Clearance: 24.6 mL/min (A) (by C-G formula based on SCr of 1.67 mg/dL (H)). Liver Function Tests: Recent Labs  Lab 12/24/24 0430 12/25/24 2055  AST  --  13*  ALT  --  12  ALKPHOS  --  182*  BILITOT  --  0.3  PROT  --  7.7  ALBUMIN 3.5 3.7   No results for input(s): LIPASE, AMYLASE in the last 168 hours. No results for  input(s): AMMONIA in the last 168 hours. Coagulation Profile: No results for input(s): INR, PROTIME in the last 168 hours. Cardiac Enzymes: No results for input(s): CKTOTAL, CKMB, CKMBINDEX, TROPONINI in the last 168 hours. BNP (last 3 results) No results for input(s): PROBNP in  the last 8760 hours. HbA1C: No results for input(s): HGBA1C in the last 72 hours. CBG: Recent Labs  Lab 12/22/24 0752 12/23/24 0802 12/24/24 0743  GLUCAP 127* 89 111*   Lipid Profile: No results for input(s): CHOL, HDL, LDLCALC, TRIG, CHOLHDL, LDLDIRECT in the last 72 hours. Thyroid  Function Tests: No results for input(s): TSH, T4TOTAL, FREET4, T3FREE, THYROIDAB in the last 72 hours. Anemia Panel: No results for input(s): VITAMINB12, FOLATE, FERRITIN, TIBC, IRON, RETICCTPCT in the last 72 hours. Urine analysis:    Component Value Date/Time   COLORURINE YELLOW (A) 10/27/2023 0304   APPEARANCEUR HAZY (A) 10/27/2023 0304   APPEARANCEUR Clear 01/20/2012 1203   LABSPEC 1.019 10/27/2023 0304   LABSPEC 1.004 01/20/2012 1203   PHURINE 7.0 10/27/2023 0304   GLUCOSEU NEGATIVE 10/27/2023 0304   GLUCOSEU Negative 01/20/2012 1203   HGBUR NEGATIVE 10/27/2023 0304   BILIRUBINUR NEGATIVE 10/27/2023 0304   BILIRUBINUR Negative 01/20/2012 1203   KETONESUR NEGATIVE 10/27/2023 0304   PROTEINUR NEGATIVE 10/27/2023 0304   NITRITE NEGATIVE 10/27/2023 0304   LEUKOCYTESUR SMALL (A) 10/27/2023 0304   LEUKOCYTESUR Negative 01/20/2012 1203   Sepsis Labs: @LABRCNTIP (procalcitonin:4,lacticidven:4) ) Recent Results (from the past 240 hours)  Resp panel by RT-PCR (RSV, Flu A&B, Covid) Anterior Nasal Swab     Status: None   Collection Time: 12/21/24  4:10 PM   Specimen: Anterior Nasal Swab  Result Value Ref Range Status   SARS Coronavirus 2 by RT PCR NEGATIVE NEGATIVE Final    Comment: (NOTE) SARS-CoV-2 target nucleic acids are NOT DETECTED.  The SARS-CoV-2 RNA is generally detectable in upper respiratory specimens during the acute phase of infection. The lowest concentration of SARS-CoV-2 viral copies this assay can detect is 138 copies/mL. A negative result does not preclude SARS-Cov-2 infection and should not be used as the sole basis for treatment or other  patient management decisions. A negative result may occur with  improper specimen collection/handling, submission of specimen other than nasopharyngeal swab, presence of viral mutation(s) within the areas targeted by this assay, and inadequate number of viral copies(<138 copies/mL). A negative result must be combined with clinical observations, patient history, and epidemiological information. The expected result is Negative.  Fact Sheet for Patients:  bloggercourse.com  Fact Sheet for Healthcare Providers:  seriousbroker.it  This test is no t yet approved or cleared by the [place]  FDA and  has been authorized for detection and/or diagnosis of SARS-CoV-2 by FDA under an Emergency Use Authorization (EUA). This EUA will remain  in effect (meaning this test can be used) for the duration of the COVID-19 declaration under Section 564(b)(1) of the Act, 21 U.S.C.section 360bbb-3(b)(1), unless the authorization is terminated  or revoked sooner.       Influenza A by PCR NEGATIVE NEGATIVE Final   Influenza B by PCR NEGATIVE NEGATIVE Final    Comment: (NOTE) The Xpert Xpress SARS-CoV-2/FLU/RSV plus assay is intended as an aid in the diagnosis of influenza from Nasopharyngeal swab specimens and should not be used as a sole basis for treatment. Nasal washings and aspirates are unacceptable for Xpert Xpress SARS-CoV-2/FLU/RSV testing.  Fact Sheet for Patients: bloggercourse.com  Fact Sheet for Healthcare Providers: seriousbroker.it  This test is not yet approved or cleared by  the [place]  FDA and has been authorized for detection and/or diagnosis of SARS-CoV-2 by FDA under an Emergency Use Authorization (EUA). This EUA will remain in effect (meaning this test can be used) for the duration of the COVID-19 declaration under Section 564(b)(1) of the Act, 21 U.S.C. section  360bbb-3(b)(1), unless the authorization is terminated or revoked.     Resp Syncytial Virus by PCR NEGATIVE NEGATIVE Final    Comment: (NOTE) Fact Sheet for Patients: bloggercourse.com  Fact Sheet for Healthcare Providers: seriousbroker.it  This test is not yet approved or cleared by the [place]  FDA and has been authorized for detection and/or diagnosis of SARS-CoV-2 by FDA under an Emergency Use Authorization (EUA). This EUA will remain in effect (meaning this test can be used) for the duration of the COVID-19 declaration under Section 564(b)(1) of the Act, 21 U.S.C. section 360bbb-3(b)(1), unless the authorization is terminated or revoked.  Performed at [facility], [address] [place], [place] [address]   Blood culture (routine x 2)     Status: None (Preliminary result)   Collection Time: 12/21/24  4:10 PM   Specimen: BLOOD  Result Value Ref Range Status   Specimen Description BLOOD BLOOD RIGHT ARM  Final   Special Requests   Final    BOTTLES DRAWN AEROBIC AND ANAEROBIC Blood Culture adequate volume   Culture   Final    NO GROWTH 3 DAYS Performed at [facility], [address], [place], [place] [address]    Report Status PENDING  Incomplete  Blood culture (routine x 2)     Status: None (Preliminary result)   Collection Time: 12/21/24  4:10 PM   Specimen: BLOOD  Result Value Ref Range Status   Specimen Description BLOOD BLOOD RIGHT ARM  Final   Special Requests   Final    BOTTLES DRAWN AEROBIC AND ANAEROBIC Blood Culture results may not be optimal due to an inadequate volume of blood received in culture bottles   Culture   Final    NO GROWTH 3 DAYS Performed at [facility], [address], [place], [place] [address]    Report Status PENDING  Incomplete     Radiological Exams on Admission:   Assessment/Plan Principal Problem:   Syncope Active Problems:   Fall at home,  initial encounter   Diarrhea   Chronic obstructive pulmonary disease (COPD) (HCC)   Chronic hypoxic respiratory failure (HCC)   Myocardial injury   Hyperlipidemia   Malignant neoplasm of upper lobe of right lung (HCC)   AKI (acute kidney injury)   Opioid dependence (HCC)   Left foot pain   Anxiety and depression   Assessment and Plan:  Syncope and fall at home, initial encounter: Etiology is not clear, possibly due to dehydration secondary to diarrhea.  Patient reports lightheadedness and continuation of diarrhea.  No focal neurodeficit on physical examination.  Other differential diagnosis to include TIA and PE, but less likely.   - Place in tele bed for for obs - Frequent neuro checks  - Fall precaution - PT/OT - IVF: 1L NS, then  75 cc/h - f/u CT-head  Diarrhea: -IVF as above -f/u C. difficile and GI pathogen panel  Chronic obstructive pulmonary disease (COPD) and chronic hypoxic respiratory failure [facility]): Patient is on 2-3 L oxygen at baseline, currently on 40's oxygen with 97% of saturation.  No respiratory distress. Patient has mild dry cough and mild SOB.  No wheezing. - Bronchodilators and as needed Mucinex   Myocardial  injury: trop  28. No CP, likely demand ischemia. -Continue aspirin , Zocor  - Trend troponin  Hyperlipidemia -Zocor   Malignant neoplasm of upper lobe of right lung [facility]): S/p of radiation therapy -Follow-up with oncology  AKI (acute kidney injury): Likely due to dehydration secondary to diarrhea. -IV fluid as above - Avoid using renal toxic medications.  Opioid dependence (HCC) -Continue home Norco as needed  Left foot pain: X-ray showed possible Lisfranc injury and possible fracture at the base of the 2nd metatarsal bone. -As needed Norco and Tylenol  for pain - Follow-up CT scan of left foot  Anxiety and depression:  - Continue home Zoloft , amitriptyline       DVT ppx: SCD (pending CT-head)  Code Status: Full code per pt  Family  Communication:     not done, no family member is at bed side.      Disposition Plan:  Anticipate discharge back to previous environment  Consults called:  none  Admission status and Level of care: Telemetry:    for obs   Dispo: The patient is from: Home              Anticipated d/c is to: Home              Anticipated d/c date is: 1 day              Patient currently is not medically stable to d/c.    Severity of Illness:  The appropriate patient status for this patient is OBSERVATION. Observation status is judged to be reasonable and necessary in order to provide the required intensity of service to ensure the patient's safety. The patient's presenting symptoms, physical exam findings, and initial radiographic and laboratory data in the context of their medical condition is felt to place them at decreased risk for further clinical deterioration. Furthermore, it is anticipated that the patient will be medically stable for discharge from the hospital within 2 midnights of admission.        Date of Service 12/26/2024    Caleb Exon [facility]   If 7PM-7AM, please contact night-coverage [email or web address] 12/26/2024, 1:10 AM     [1]  Allergies Allergen Reactions   Avelox [Moxifloxacin Hcl] Shortness Of Breath and Swelling   Moxifloxacin Anaphylaxis   Sulfa Antibiotics Anaphylaxis   Cefadroxil Itching   Elemental Sulfur Other (See Comments)    Reaction as a child and not sure what.    "

## 2024-12-25 NOTE — ED Provider Notes (Signed)
 "  [facility] Provider Note    Event Date/Time   First MD Initiated Contact with Patient 12/25/24 2007     (approximate)   History   Fall and Loss of Consciousness   HPI  Alexandra [name] is a 84 y.o. female   who presents to the emergency department today because of concerns for syncope and falls.  The patient was discharged from the hospital yesterday after an admission for pneumonia.  She states that since going home she has had diarrhea which she attributes to the antibiotics.  Today she had multiple syncopal episodes.  She was feeling lightheaded before she passed out.  She denies any chest pain or palpitations.  Patient did somehow injure her left foot although denies any significant pain.   Physical Exam   Triage Vital Signs: ED Triage Vitals  Encounter Vitals Group     BP 12/25/24 2008 (!) 128/59     Girls Systolic BP Percentile --      Girls Diastolic BP Percentile --      Boys Systolic BP Percentile --      Boys Diastolic BP Percentile --      Pulse Rate 12/25/24 2008 76     Resp 12/25/24 2008 (!) 22     Temp 12/25/24 2008 (!) 97.5 F (36.4 C)     Temp Source 12/25/24 2008 Oral     SpO2 12/25/24 2008 97 %     Weight 12/25/24 2012 132 lb (59.9 kg)     Height 12/25/24 2012 5' 8 (1.727 m)     Head Circumference --      Peak Flow --      Pain Score 12/25/24 2009 3     Pain Loc --      Pain Education --      Exclude from Growth Chart --     Most recent vital signs: Vitals:   12/25/24 2008  BP: (!) 128/59  Pulse: 76  Resp: (!) 22  Temp: (!) 97.5 F (36.4 C)  SpO2: 97%   General: Awake, alert, oriented. CV:  Good peripheral perfusion. Regular rate and rhythm Resp:  Normal effort. Lungs clear. Abd:  No distention. Non tender.  ED Results / Procedures / Treatments   Labs (all labs ordered are listed, but only abnormal results are displayed) Labs Reviewed  CBC WITH DIFFERENTIAL/PLATELET - Abnormal; Notable for the following  components:      Result Value   WBC 18.6 (*)    RBC 3.63 (*)    Hemoglobin 9.7 (*)    HCT 32.8 (*)    MCHC 29.6 (*)    Platelets 520 (*)    Neutro Abs 16.0 (*)    Monocytes Absolute 1.2 (*)    Abs Immature Granulocytes 0.27 (*)    All other components within normal limits  COMPREHENSIVE METABOLIC PANEL WITH GFR  TROPONIN T, HIGH SENSITIVITY     EKG  I, Guadalupe Eagles, attending physician, personally viewed and interpreted this EKG  EKG Time: 2006 Rate: 76 Rhythm: sinus rhythm Axis: normal Intervals: qtc 490 QRS: narrow ST changes: no st elevation Impression: normal ekg   RADIOLOGY I independently interpreted and visualized the CXR. My interpretation: No pneumonia Radiology interpretation:  IMPRESSION:  1. Emphysema and suspected chronic scarring at the right base.  2. Stable right apical pleuroparenchymal opacity, reference chest CT  from October      PROCEDURES:  Critical Care performed: No  MEDICATIONS ORDERED IN ED: Medications -  No data to display   IMPRESSION / MDM / ASSESSMENT AND PLAN / ED COURSE  I reviewed the triage vital signs and the nursing notes.                              Differential diagnosis includes, but is not limited to, dehydration, anemia, infection  Patient's presentation is most consistent with acute presentation with potential threat to life or bodily function.   Patient presented to the emergency department today because of concerns for multiple syncopal episodes.  Patient was discharged from the hospital yesterday after admission for pneumonia.  On exam patient is awake alert and oriented.  Blood work however is notable for significant AKI.  Do think this is likely contributing to the patient's syncope.  I did repeat a chest x-ray which does not show any worsening infection.  Patient does not appear to be symptomatic from any pneumonia at this time.  Will defer antibiotics.  Discussed with Dr. Hilma with the hospitalist service  who will evaluate for admission.      FINAL CLINICAL IMPRESSION(S) / ED DIAGNOSES   Final diagnoses:  Syncope, unspecified syncope type  Dehydration     Note:  This document was prepared using Dragon voice recognition software and may include unintentional dictation errors.    Floy Roberts, MD 12/25/24 2345  "

## 2024-12-26 ENCOUNTER — Encounter: Payer: Self-pay | Admitting: Internal Medicine

## 2024-12-26 ENCOUNTER — Observation Stay

## 2024-12-26 DIAGNOSIS — E785 Hyperlipidemia, unspecified: Secondary | ICD-10-CM | POA: Diagnosis not present

## 2024-12-26 DIAGNOSIS — R55 Syncope and collapse: Secondary | ICD-10-CM | POA: Diagnosis not present

## 2024-12-26 DIAGNOSIS — F1129 Opioid dependence with unspecified opioid-induced disorder: Secondary | ICD-10-CM

## 2024-12-26 DIAGNOSIS — J449 Chronic obstructive pulmonary disease, unspecified: Secondary | ICD-10-CM | POA: Diagnosis not present

## 2024-12-26 LAB — CULTURE, BLOOD (ROUTINE X 2)
Culture: NO GROWTH
Culture: NO GROWTH
Special Requests: ADEQUATE

## 2024-12-26 LAB — BASIC METABOLIC PANEL WITH GFR
Anion gap: 12 (ref 5–15)
BUN: 30 mg/dL — ABNORMAL HIGH (ref 8–23)
CO2: 28 mmol/L (ref 22–32)
Calcium: 8.8 mg/dL — ABNORMAL LOW (ref 8.9–10.3)
Chloride: 98 mmol/L (ref 98–111)
Creatinine, Ser: 1.24 mg/dL — ABNORMAL HIGH (ref 0.44–1.00)
GFR, Estimated: 43 mL/min — ABNORMAL LOW
Glucose, Bld: 103 mg/dL — ABNORMAL HIGH (ref 70–99)
Potassium: 4 mmol/L (ref 3.5–5.1)
Sodium: 138 mmol/L (ref 135–145)

## 2024-12-26 LAB — TROPONIN T, HIGH SENSITIVITY
Troponin T High Sensitivity: 23 ng/L — ABNORMAL HIGH (ref 0–19)
Troponin T High Sensitivity: 21 ng/L — ABNORMAL HIGH (ref 0–19)
Troponin T High Sensitivity: 21 ng/L — ABNORMAL HIGH (ref 0–19)

## 2024-12-26 LAB — CBC
HCT: 33.3 % — ABNORMAL LOW (ref 36.0–46.0)
Hemoglobin: 9.3 g/dL — ABNORMAL LOW (ref 12.0–15.0)
MCH: 27.1 pg (ref 26.0–34.0)
MCHC: 27.9 g/dL — ABNORMAL LOW (ref 30.0–36.0)
MCV: 97.1 fL (ref 80.0–100.0)
Platelets: 334 K/uL (ref 150–400)
RBC: 3.43 MIL/uL — ABNORMAL LOW (ref 3.87–5.11)
RDW: 14.3 % (ref 11.5–15.5)
WBC: 15.8 K/uL — ABNORMAL HIGH (ref 4.0–10.5)
nRBC: 0 % (ref 0.0–0.2)

## 2024-12-26 MED ORDER — PANTOPRAZOLE SODIUM 40 MG PO TBEC
40.0000 mg | DELAYED_RELEASE_TABLET | Freq: Two times a day (BID) | ORAL | Status: DC
  Administered 2024-12-26 – 2024-12-31 (×11): 40 mg via ORAL
  Filled 2024-12-26 (×11): qty 1

## 2024-12-26 MED ORDER — IPRATROPIUM-ALBUTEROL 0.5-2.5 (3) MG/3ML IN SOLN
3.0000 mL | Freq: Four times a day (QID) | RESPIRATORY_TRACT | Status: DC
  Administered 2024-12-26: 3 mL via RESPIRATORY_TRACT
  Filled 2024-12-26: qty 3

## 2024-12-26 MED ORDER — LOPERAMIDE HCL 2 MG PO CAPS
2.0000 mg | ORAL_CAPSULE | ORAL | Status: DC | PRN
  Administered 2024-12-26 – 2024-12-28 (×4): 2 mg via ORAL
  Filled 2024-12-26 (×4): qty 1

## 2024-12-26 MED ORDER — LATANOPROST 0.005 % OP SOLN
1.0000 [drp] | Freq: Every day | OPHTHALMIC | Status: DC
  Administered 2024-12-27 – 2024-12-30 (×4): 1 [drp] via OPHTHALMIC
  Filled 2024-12-26 (×2): qty 2.5

## 2024-12-26 MED ORDER — CYCLOBENZAPRINE HCL 10 MG PO TABS: 5.0000 mg | ORAL_TABLET | Freq: Three times a day (TID) | ORAL | Status: DC | PRN

## 2024-12-26 MED ORDER — IPRATROPIUM-ALBUTEROL 0.5-2.5 (3) MG/3ML IN SOLN
3.0000 mL | Freq: Two times a day (BID) | RESPIRATORY_TRACT | Status: DC
  Administered 2024-12-26 – 2024-12-28 (×4): 3 mL via RESPIRATORY_TRACT
  Filled 2024-12-26 (×4): qty 3

## 2024-12-26 MED ORDER — GABAPENTIN 300 MG PO CAPS
1200.0000 mg | ORAL_CAPSULE | Freq: Three times a day (TID) | ORAL | Status: DC
  Administered 2024-12-26 – 2024-12-31 (×16): 1200 mg via ORAL
  Filled 2024-12-26 (×16): qty 4

## 2024-12-26 MED ORDER — ASPIRIN 81 MG PO CHEW
81.0000 mg | CHEWABLE_TABLET | Freq: Every day | ORAL | Status: DC
  Administered 2024-12-26 – 2024-12-31 (×6): 81 mg via ORAL
  Filled 2024-12-26 (×6): qty 1

## 2024-12-26 MED ORDER — AMITRIPTYLINE HCL 25 MG PO TABS
50.0000 mg | ORAL_TABLET | Freq: Every day | ORAL | Status: DC
  Administered 2024-12-26 – 2024-12-30 (×5): 50 mg via ORAL
  Filled 2024-12-26 (×5): qty 2

## 2024-12-26 MED ORDER — SIMVASTATIN 20 MG PO TABS
40.0000 mg | ORAL_TABLET | Freq: Every evening | ORAL | Status: DC
  Administered 2024-12-26 – 2024-12-30 (×5): 40 mg via ORAL
  Filled 2024-12-26 (×5): qty 2

## 2024-12-26 MED ORDER — ZOLPIDEM TARTRATE 5 MG PO TABS
5.0000 mg | ORAL_TABLET | Freq: Every day | ORAL | Status: DC
  Administered 2024-12-26 – 2024-12-30 (×5): 5 mg via ORAL
  Filled 2024-12-26 (×6): qty 1

## 2024-12-26 MED ORDER — BUDESON-GLYCOPYRROL-FORMOTEROL 160-9-4.8 MCG/ACT IN AERO
2.0000 | INHALATION_SPRAY | Freq: Two times a day (BID) | RESPIRATORY_TRACT | Status: DC
  Administered 2024-12-27 – 2024-12-31 (×9): 2 via RESPIRATORY_TRACT
  Filled 2024-12-26: qty 5.9

## 2024-12-26 MED ORDER — SERTRALINE HCL 50 MG PO TABS
25.0000 mg | ORAL_TABLET | Freq: Every day | ORAL | Status: DC
  Administered 2024-12-26 – 2024-12-31 (×6): 25 mg via ORAL
  Filled 2024-12-26: qty 1
  Filled 2024-12-26: qty 0.5
  Filled 2024-12-26 (×4): qty 1

## 2024-12-26 NOTE — Progress Notes (Signed)
 OT Cancellation Note  Patient Details Name: Alexandra [name] MRN: [id number] DOB: May 07, 1941   Cancelled Treatment:    Reason Eval/Treat Not Completed: Medical issues which prohibited therapy. Orders received, chart reviewed. Awaiting further imaging of L foot as well as ortho vs podiatry consult for further guidance on weightbearing status. OT will hold and continue to follow.   Pearce Littlefield L. Wynn Kernes, OTR/L  12/26/2024, 8:30 AM

## 2024-12-26 NOTE — Progress Notes (Signed)
 " PROGRESS NOTE    [name]  FMW:[id number] DOB: 1941/04/02 DOA: 12/25/2024 PCP: Derick Leita POUR, MD  Chief Complaint  Patient presents with   Fall   Loss of Consciousness    Hospital Course:  Alexandra [name] is an 84yoF with [name], COPD, chronic oxygen dependence on 2 to 3 L O2, depression with anxiety, anemia, right lung cancer s/p radiation, idiopathic neuropathy, opioid dependence, prior perforated colon, who presents with syncope and diarrhea. Patient was hospitalized 1/4 through 1/7 with sepsis secondary to pneumonia and COPD exacerbation.  During the hospital stay states she completed a course of Rocephin  and azithromycin .  She also had diarrhea during that time which was thought to be secondary to antibiotic use. Upon discharge home she reports diarrhea has been persistent 4-5 times a day.  Prior to arrival she had syncopal episode while walking to the bathroom.  She injured her foot in the fall and now has pain in that area. On arrival she was found to have leukocytosis of 18.6, AKI with creatinine of 1.67, BUN 32, mildly tachycardic to 106, vitals otherwise WNL.  She is on her home O2 requirement.  CXR with emphysema and stable right apical pleural-parenchymal opacity.  X-ray of the left foot does show abnormal alignment at the base of the first metatarsal concerning for Lisfranc fracture.  CT ordered for better evaluation.  She was admitted for further workup.  Subjective: This morning patient reports she is feeling a bit better.  She reports diarrhea is beginning to slow.  She is anxious about plans to return home because she is the primary caregiver of her husband who has advanced dementia.   Objective: Vitals:   12/26/24 0006 12/26/24 0426 12/26/24 0804 12/26/24 1119  BP:  (!) 147/63 (!) 154/61 (!) 140/60  Pulse: 81 79 75 76  Resp:  18  18  Temp:  97.7 F (36.5 C) 97.8 F (36.6 C) 97.9 F (36.6 C)  TempSrc:   Oral   SpO2: 90% 100% 100% 99%  Weight:      Height:         Intake/Output Summary (Last 24 hours) at 12/26/2024 1418 Last data filed at 12/26/2024 1000 Gross per 24 hour  Intake 654.6 ml  Output --  Net 654.6 ml   Filed Weights   12/25/24 2012  Weight: 59.9 kg    Examination: General exam: Appears calm and comfortable, NAD  Respiratory system: No work of breathing, symmetric chest wall expansion Cardiovascular system: S1 & S2 heard, RRR.  Gastrointestinal system: Abdomen is nondistended, soft and nontender.  Neuro: Alert and oriented. No focal neurological deficits. Extremities: Left foot with dorsal bruising, tender to palpation  Assessment & Plan:  Principal Problem:   Syncope Active Problems:   Fall at home, initial encounter   Diarrhea   Chronic obstructive pulmonary disease (COPD) (HCC)   Chronic hypoxic respiratory failure (HCC)   Myocardial injury   Hyperlipidemia   Malignant neoplasm of upper lobe of right lung (HCC)   AKI (acute kidney injury)   Opioid dependence (HCC)   Left foot pain   Anxiety and depression   Syncope and fall at home, initial encounter Orthostatic hypotension - Suspected secondary to dehydration from diarrhea - Patient very dehydrated on arrival - No focal neurodeficits - Head CT: Pending - Continue with IV fluids - Orthostatic vitals + lying 151/66, standing 79/43 - Fall precautions - Monitor on telemetry - PT/OT  Diarrhea - IV fluids as above - C. difficile  and GI PCR: - As needed Imodium  when pathogen panel is negative  COPD Chronic hypoxic respiratory failure - At baseline on 2 to 3 L O2.  Presently appears to be at her respiratory baseline - No evidence of respiratory distress.  Denies SOB this morning  Elevated troponin - Troponin 28.  Downtrending on repeat.  No chest pain.  Likely demand ischemia - Continue home aspirin  and statin  Hyperlipidemia - Statin  Malignant neoplasm of right upper lung, status postradiation therapy - Continue outpatient follow-up with  oncology - Post radiation findings on CT/CXR  Leukocytosis - On arrival WBC 19, downtrending to 15.8, stool studies pending to evaluate for new infection.  No pneumonia on CXR.  May be reactive due to dehydration on arrival - Continue to monitor.  No indication for antibiotics at this time.  No fever.  AKI - Likely secondary to dehydration from diarrhea - Baseline creatinine 0.71, creatinine on arrival 1.67, improving with fluids. - IV fluids as above - Trend CMP - Avoid renal toxic medications  Chronic pain Opioid dependence - Continue home meds  Left foot pain - Plain film with possible Lisfranc, CT ordered to better evaluate - Will consult podiatry if positive for fracture - For now recommend nonweightbearing  Anxiety Depression - Continue home meds   DVT prophylaxis: SCDs   Code Status: Full Code Disposition: Observation pending clinical resolution  Consultants:    Procedures:    Antimicrobials:  Anti-infectives (From admission, onward)    None       Data Reviewed: I have personally reviewed following labs and imaging studies CBC: Recent Labs  Lab 12/22/24 0503 12/23/24 0424 12/24/24 0430 12/25/24 2055 12/26/24 0324  WBC 10.6* 19.4* 13.3* 18.6* 15.8*  NEUTROABS  --   --   --  16.0*  --   HGB 8.3* 8.7* 9.1* 9.7* 9.3*  HCT 28.0* 29.5* 30.7* 32.8* 33.3*  MCV 90.3 91.6 91.1 90.4 97.1  PLT 362 513* 437* 520* 334   Basic Metabolic Panel: Recent Labs  Lab 12/21/24 1713 12/22/24 0502 12/22/24 0503 12/23/24 0422 12/23/24 0424 12/24/24 0430 12/25/24 2055 12/26/24 0324  NA  --   --  140  --  141 140 137 138  K  --   --  3.9  --  3.4* 4.2 3.7 4.0  CL  --   --  97*  --  94* 96* 93* 98  CO2  --   --  34*  --  38* 37* 31 28  GLUCOSE  --   --  142*  --  108* 85 169* 103*  BUN  --   --  12  --  15 18 32* 30*  CREATININE  --   --  0.64  --  0.79 0.71 1.67* 1.24*  CALCIUM  --   --  9.7  --  9.5 9.8 9.3 8.8*  MG 1.9 1.9  --  2.0  --  2.2  --   --    PHOS  --  3.6  --  3.8  --  3.0  --   --    GFR: Estimated Creatinine Clearance: 33.1 mL/min (A) (by C-G formula based on SCr of 1.24 mg/dL (H)). Liver Function Tests: Recent Labs  Lab 12/24/24 0430 12/25/24 2055  AST  --  13*  ALT  --  12  ALKPHOS  --  182*  BILITOT  --  0.3  PROT  --  7.7  ALBUMIN 3.5 3.7   CBG: Recent Labs  Lab 12/22/24 0752 12/23/24 0802 12/24/24 0743  GLUCAP 127* 89 111*    Recent Results (from the past 240 hours)  Resp panel by RT-PCR (RSV, Flu A&B, Covid) Anterior Nasal Swab     Status: None   Collection Time: 12/21/24  4:10 PM   Specimen: Anterior Nasal Swab  Result Value Ref Range Status   SARS Coronavirus 2 by RT PCR NEGATIVE NEGATIVE Final    Comment: (NOTE) SARS-CoV-2 target nucleic acids are NOT DETECTED.  The SARS-CoV-2 RNA is generally detectable in upper respiratory specimens during the acute phase of infection. The lowest concentration of SARS-CoV-2 viral copies this assay can detect is 138 copies/mL. A negative result does not preclude SARS-Cov-2 infection and should not be used as the sole basis for treatment or other patient management decisions. A negative result may occur with  improper specimen collection/handling, submission of specimen other than nasopharyngeal swab, presence of viral mutation(s) within the areas targeted by this assay, and inadequate number of viral copies(<138 copies/mL). A negative result must be combined with clinical observations, patient history, and epidemiological information. The expected result is Negative.  Fact Sheet for Patients:  bloggercourse.com  Fact Sheet for Healthcare Providers:  seriousbroker.it  This test is no t yet approved or cleared by the [place]  FDA and  has been authorized for detection and/or diagnosis of SARS-CoV-2 by FDA under an Emergency Use Authorization (EUA). This EUA will remain  in effect (meaning this test  can be used) for the duration of the COVID-19 declaration under Section 564(b)(1) of the Act, 21 U.S.C.section 360bbb-3(b)(1), unless the authorization is terminated  or revoked sooner.       Influenza A by PCR NEGATIVE NEGATIVE Final   Influenza B by PCR NEGATIVE NEGATIVE Final    Comment: (NOTE) The Xpert Xpress SARS-CoV-2/FLU/RSV plus assay is intended as an aid in the diagnosis of influenza from Nasopharyngeal swab specimens and should not be used as a sole basis for treatment. Nasal washings and aspirates are unacceptable for Xpert Xpress SARS-CoV-2/FLU/RSV testing.  Fact Sheet for Patients: bloggercourse.com  Fact Sheet for Healthcare Providers: seriousbroker.it  This test is not yet approved or cleared by the [place]  FDA and has been authorized for detection and/or diagnosis of SARS-CoV-2 by FDA under an Emergency Use Authorization (EUA). This EUA will remain in effect (meaning this test can be used) for the duration of the COVID-19 declaration under Section 564(b)(1) of the Act, 21 U.S.C. section 360bbb-3(b)(1), unless the authorization is terminated or revoked.     Resp Syncytial Virus by PCR NEGATIVE NEGATIVE Final    Comment: (NOTE) Fact Sheet for Patients: bloggercourse.com  Fact Sheet for Healthcare Providers: seriousbroker.it  This test is not yet approved or cleared by the [place]  FDA and has been authorized for detection and/or diagnosis of SARS-CoV-2 by FDA under an Emergency Use Authorization (EUA). This EUA will remain in effect (meaning this test can be used) for the duration of the COVID-19 declaration under Section 564(b)(1) of the Act, 21 U.S.C. section 360bbb-3(b)(1), unless the authorization is terminated or revoked.  Performed at [facility], [address] [place], [place] [address]   Blood culture (routine x 2)      Status: None   Collection Time: 12/21/24  4:10 PM   Specimen: BLOOD  Result Value Ref Range Status   Specimen Description BLOOD BLOOD RIGHT ARM  Final   Special Requests   Final    BOTTLES DRAWN AEROBIC AND ANAEROBIC Blood Culture adequate  volume   Culture   Final    NO GROWTH 5 DAYS Performed at [facility], [address] [place], [place] [address]    Report Status 12/26/2024 FINAL  Final  Blood culture (routine x 2)     Status: None   Collection Time: 12/21/24  4:10 PM   Specimen: BLOOD  Result Value Ref Range Status   Specimen Description BLOOD BLOOD RIGHT ARM  Final   Special Requests   Final    BOTTLES DRAWN AEROBIC AND ANAEROBIC Blood Culture results may not be optimal due to an inadequate volume of blood received in culture bottles   Culture   Final    NO GROWTH 5 DAYS Performed at [facility], [address] [place], [place] [address]    Report Status 12/26/2024 FINAL  Final     Radiology Studies: DG Foot Complete Left Result Date: 12/25/2024 EXAM: 3 OR MORE VIEW(S) XRAY OF THE LEFT FOOT 12/25/2024 11:20:25 PM COMPARISON: None available. CLINICAL HISTORY: trauma FINDINGS: BONES AND JOINTS: Abnormal alignment at the base of the first metatarsal and medial cuneiform concerning for Lisfranc injury. There may be congenital coalition of the medial and middle cuneiform bones, which could account for this. Possible fracture at the base of the 2nd metatarsal bone. Mild midfoot degenerative changes. Suggest CT or MRI for further evaluation. SOFT TISSUES: Dorsal forefoot subcutaneous soft tissue edema. IMPRESSION: 1. Abnormal alignment at the base of the first metatarsal and medial cuneiform concerning for Lisfranc injury, although congenital coalition of the medial and middle cuneiforms could account for this appearance. Possible fracture at the base of the 2nd metatarsal bone. Suggest CT or MRI for further evaluation. 2. Dorsal forefoot subcutaneous soft tissue  edema. Electronically signed by: Elsie Gravely MD 12/25/2024 11:23 PM EST RP Workstation: [id number]   DG Chest Portable 1 View Result Date: 12/25/2024 CLINICAL DATA:  Syncope EXAM: PORTABLE CHEST 1 VIEW COMPARISON:  12/21/2024, chest CT 10/17/2024, chest x-ray 10/27/2023 FINDINGS: Emphysema. No acute airspace disease. Stable right apical opacity since chest CT from October. Stable cardiomediastinal silhouette. Chronic scarring at the right base. IMPRESSION: 1. Emphysema and suspected chronic scarring at the right base. 2. Stable right apical pleuroparenchymal opacity, reference chest CT from October Electronically Signed   By: Luke Bun M.D.   On: 12/25/2024 21:14    Scheduled Meds:  amitriptyline   50 mg Oral QHS   aspirin   81 mg Oral Daily   [START ON 12/27/2024] budesonide -glycopyrrolate -formoterol   2 puff Inhalation BID   gabapentin   1,200 mg Oral TID   ipratropium-albuterol   3 mL Nebulization BID   latanoprost   1 drop Both Eyes QHS   pantoprazole   40 mg Oral BID   sertraline   25 mg Oral Daily   simvastatin   40 mg Oral QPM   zolpidem   5 mg Oral QHS   Continuous Infusions:  sodium chloride  75 mL/hr at 12/26/24 0605     LOS: 0 days  MDM: Patient is high risk for one or more organ failure.  They necessitate ongoing hospitalization for continued IV therapies and subsequent lab monitoring. Total time spent interpreting labs and vitals, reviewing the medical record, coordinating care amongst consultants and care team members, directly assessing and discussing care with the patient and/or family: 55 min  Ezechiel Stooksbury, DO [facility]  To contact the attending physician between 7A-7P please use Epic Chat. To contact the covering physician during after hours 7P-7A, please review Amion.  12/26/2024, 2:18 PM   *This document has  been created with the assistance of dictation software. Please excuse typographical errors. *   "

## 2024-12-26 NOTE — TOC Initial Note (Signed)
 Transition of Care [facility]) - Initial/Assessment Note    Patient Details  Name: Alexandra [name] MRN: [id number] Date of Birth: 08-18-1941  Transition [facility]) CM/SW Contact:    Nathanael CHRISTELLA Ring, RN Phone Number: 12/26/2024, 3:36 PM  Clinical Narrative:                 CM met with patient at the bedside this morning, introduced self and explained role in DC planning.  Patient was just recently discharged on 1/7 with home health through [facility] and sent home with [facility] through [facility].  She would like to again discharge home with home health services, same company.  She has oxygen at home through [facility], [place] 2L.    Expected Discharge Plan: Home w Home Health Services Barriers to Discharge: Continued Medical Work up   Patient Goals and CMS Choice Patient states their goals for this hospitalization and ongoing recovery are:: to go back home with home health CMS Medicare.gov Compare Post Acute Care list provided to:: Patient Choice offered to / list presented to : Patient      Expected Discharge Plan and Services   Discharge Planning Services: CM Consult Post Acute Care Choice: Home Health Living arrangements for the past 2 months: Single Family Home                             HH Agency: [facility] Date [facility] Agency Contacted: 12/26/24 Time HH Agency Contacted: [phone]    Prior Living Arrangements/Services Living arrangements for the past 2 months: Single Family Home Lives with:: Spouse Patient language and need for interpreter reviewed:: Yes Do you feel safe going back to the place where you live?: Yes      Need for Family Participation in Patient Care: Yes (Comment) Care giver support system in place?: Yes (comment) Current home services: DME (oxygen) Criminal Activity/Legal Involvement Pertinent to Current Situation/Hospitalization: No - Comment as needed  Activities of Daily Living   ADL Screening (condition at time of admission) Independently performs  ADLs?: Yes (appropriate for developmental age) Is the patient deaf or have difficulty hearing?: No Does the patient have difficulty seeing, even when wearing glasses/contacts?: No Does the patient have difficulty concentrating, remembering, or making decisions?: No  Permission Sought/Granted Permission sought to share information with : Family Supports, Oceanographer granted to share information with : Yes, Verbal Permission Granted  Share Information with NAME: Curry Dulski, daughter  Permission granted to share info w AGENCY: [facility]     Permission granted to share info w Contact Information: [phone]  Emotional Assessment Appearance:: Appears stated age Attitude/Demeanor/Rapport: Engaged Affect (typically observed): Accepting Orientation: : Oriented to Self, Oriented to Place, Oriented to  Time, Oriented to Situation Alcohol / Substance Use: Not Applicable Psych Involvement: No (comment)  Admission diagnosis:  Dehydration [E86.0] Syncope [R55] Syncope, unspecified syncope type [R55] Patient Active Problem List   Diagnosis Date Noted   Syncope 12/25/2024   Fall at home, initial encounter 12/25/2024   Diarrhea 12/25/2024   Myocardial injury 12/25/2024   AKI (acute kidney injury) 12/25/2024   Left foot pain 12/25/2024   Elevated blood pressure reading 12/21/2024   COPD with acute exacerbation (HCC) 12/21/2024   Sepsis due to pneumonia (HCC) 10/27/2023   Chronic obstructive pulmonary disease (COPD) (HCC) 10/27/2023   Chronic hypoxic respiratory failure (HCC) 10/27/2023   Chronic pain 10/27/2023   Opioid dependence (HCC) 10/27/2023   Overdose of opiate  or related narcotic, accidental or unintentional, initial encounter (HCC) 10/27/2023   Acute toxic-metabolic encephalopathy 10/27/2023   Anemia 10/27/2023   Malignant neoplasm of upper lobe of right lung (HCC) 03/27/2023   Right femoral fracture (HCC) 12/16/2021   Anxiety and depression  12/16/2021   Insomnia 12/16/2021   GERD (gastroesophageal reflux disease) 12/16/2021   Hyperlipidemia 12/16/2021   Polypharmacy 12/16/2021   Pulmonary nodules/lesions, multiple 12/16/2021   Torus palatinus 12/16/2021   Urticaria 12/16/2021   Idiopathic peripheral neuropathy 02/06/2017   S/P hernia repair 10/18/2012   Anxiety 10/16/2012   Neuropathy 10/16/2012   Peptic ulcer 10/16/2012   Perforated sigmoid colon (HCC) 10/16/2012   Rectovaginal fistula 10/16/2012   Ventral hernia 10/16/2012   PCP:  Derick Leita POUR, MD Pharmacy:   [facility] [address]  - [address] [place] [address] Phone: [phone] Fax: [phone]     Social Drivers of Health (SDOH) Social History: SDOH Screenings   Food Insecurity: No Food Insecurity (12/26/2024)  Housing: Low Risk (12/26/2024)  Transportation Needs: No Transportation Needs (12/26/2024)  Utilities: Not At Risk (12/26/2024)  Alcohol Screen: Low Risk (03/27/2023)  Depression (PHQ2-9): Low Risk (07/17/2024)  Financial Resource Strain: Low Risk  (02/19/2024)   Received from [facility]  Physical Activity: Inactive (03/27/2023)  Social Connections: Moderately Integrated (12/26/2024)  Recent Concern: Social Connections - Socially Isolated (12/22/2024)  Stress: No Stress Concern Present (03/27/2023)  Tobacco Use: Medium Risk (12/26/2024)   SDOH Interventions:     Readmission Risk Interventions     No data to display

## 2024-12-26 NOTE — Progress Notes (Signed)
 This clinical research associate received a call from CT and was asked if the patient was ready for transport for CT scan of her head and foot. This clinical research associate made the patient aware of the new orders for CT scans. The patient refused to go to have the CT scans done. The patient told this writer no she does not want to go at this time and she would prefer to wait until later in the morning. This clinical research associate made CT aware. CT told this writer that they will do the scans later on in the morning.

## 2024-12-26 NOTE — Plan of Care (Signed)

## 2024-12-26 NOTE — Progress Notes (Signed)
 PT Cancellation Note  Patient Details Name: MALOREE UPLINGER MRN: [id number] DOB: 1941-03-08   Cancelled Treatment:    Reason Eval/Treat Not Completed: Medical issues which prohibited therapy Orders received, chart reviewed. Awaiting further imaging of L foot as well as ortho vs podiatry consult for further guidance on weightbearing status. PT will hold until information obtained.   Maryanne Finder, PT, DPT Physical Therapist - [facility]  [facility]   Keylen Eckenrode A Duward Allbritton 12/26/2024, 8:28 AM

## 2024-12-26 NOTE — Care Management Obs Status (Signed)
 MEDICARE OBSERVATION STATUS NOTIFICATION   Patient Details  Name: Alexandra [name] MRN: [id number] Date of Birth: [birth year]   Medicare Observation Status Notification Given:  Yes    Alexandra [name] 12/26/2024, 2:42 PM

## 2024-12-27 DIAGNOSIS — R55 Syncope and collapse: Secondary | ICD-10-CM | POA: Diagnosis not present

## 2024-12-27 DIAGNOSIS — E785 Hyperlipidemia, unspecified: Secondary | ICD-10-CM | POA: Diagnosis not present

## 2024-12-27 DIAGNOSIS — J449 Chronic obstructive pulmonary disease, unspecified: Secondary | ICD-10-CM | POA: Diagnosis not present

## 2024-12-27 DIAGNOSIS — F1129 Opioid dependence with unspecified opioid-induced disorder: Secondary | ICD-10-CM | POA: Diagnosis not present

## 2024-12-27 LAB — COMPREHENSIVE METABOLIC PANEL WITH GFR
ALT: 8 U/L (ref 0–44)
AST: 12 U/L — ABNORMAL LOW (ref 15–41)
Albumin: 3 g/dL — ABNORMAL LOW (ref 3.5–5.0)
Alkaline Phosphatase: 127 U/L — ABNORMAL HIGH (ref 38–126)
Anion gap: 5 (ref 5–15)
BUN: 18 mg/dL (ref 8–23)
CO2: 34 mmol/L — ABNORMAL HIGH (ref 22–32)
Calcium: 8.8 mg/dL — ABNORMAL LOW (ref 8.9–10.3)
Chloride: 104 mmol/L (ref 98–111)
Creatinine, Ser: 0.75 mg/dL (ref 0.44–1.00)
GFR, Estimated: >60 mL/min
Glucose, Bld: 82 mg/dL (ref 70–99)
Potassium: 4.1 mmol/L (ref 3.5–5.1)
Sodium: 143 mmol/L (ref 135–145)
Total Bilirubin: 0.2 mg/dL (ref 0.0–1.2)
Total Protein: 6.2 g/dL — ABNORMAL LOW (ref 6.5–8.1)

## 2024-12-27 LAB — CBC
HCT: 26.5 % — ABNORMAL LOW (ref 36.0–46.0)
Hemoglobin: 7.8 g/dL — ABNORMAL LOW (ref 12.0–15.0)
MCH: 27.2 pg (ref 26.0–34.0)
MCHC: 29.4 g/dL — ABNORMAL LOW (ref 30.0–36.0)
MCV: 92.3 fL (ref 80.0–100.0)
Platelets: 342 K/uL (ref 150–400)
RBC: 2.87 MIL/uL — ABNORMAL LOW (ref 3.87–5.11)
RDW: 14.4 % (ref 11.5–15.5)
WBC: 12.5 K/uL — ABNORMAL HIGH (ref 4.0–10.5)
nRBC: 0 % (ref 0.0–0.2)

## 2024-12-27 LAB — MAGNESIUM: Magnesium: 2.1 mg/dL (ref 1.7–2.4)

## 2024-12-27 LAB — PHOSPHORUS: Phosphorus: 2.7 mg/dL (ref 2.5–4.6)

## 2024-12-27 NOTE — Discharge Instructions (Addendum)
 You have been diagnosed with a left Lisfranc fracture, dislocation.  The fracture mostly involves the medial cuneiform, first metatarsal, and second metatarsal base.  The fracture appears to be slightly displaced and comminuted in a few pieces.  Surgery could be considered for this fracture but the fracture is not significantly displaced.  Long-term with or without surgery were likely to develop some degree of posttraumatic arthritis.  If surgery is performed the chances of arthritis are somewhat less but still possible/likely.  With or without surgery would be expected to be nonweightbearing for 6 to 8 weeks followed by weightbearing in a boot for around a month and then possibly transition to normal supportive shoe.  This does take a significant period of time to recover from.  Please follow-up in outpatient clinic in 1 week after discharge for further evaluation.  Please call office to make appointment after you are discharged.  Try to keep the cam boot on when moving around to keep the foot and ankle stable.  Recommend keeping the leg elevated and use Ace wrap's with compression to try to reduce swelling.  Currently after discussing this case with my partners we believe that is likely best to proceed with conservative intervention.  Will discuss treatment options further regarding conservative and surgical care in the outpatient setting in office.

## 2024-12-27 NOTE — Evaluation (Signed)
 Physical Therapy Evaluation Patient Details Name: Alexandra [name] MRN: [id number] DOB: 05/20/1941 Today's Date: 12/27/2024  History of Present Illness  Pt admitted to [facility] on 12/25/24 under observation for c/o syncope and falls. Imaging significant for:  possible Lisfranc injury and possible fracture at the base of the 2nd metatarsal bone. Elevated troponin due to demand ischemia. Recent hospital admission (1/4-1/7) for sepsis secondary to PNA and COPD exacerbation. Significant PMH includes: [name], COPD on 2-3 L O2, depression with anxiety, anemia, right lung cancer (s/p of radiation therapy), idiopathic neuropathy, opioid dependence, perforated colon.   Clinical Impression  Pt received in supine, asleep, and is agreeable for PT eval. At baseline, pt is normally IND with ADL's, IADL's, community ambulation with SPC, and medication management; family assists with transportation. 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 She wears 2L O2 via [place] at baseline.   Pt presents with decreased activity tolerance, functional weakness, decreased standing balance, increased pain levels, impaired skin integrity, difficulty with adherence to LLE NWB precautions, increased O2 dependence from baseline, and decreased cardiopulmonary tolerance to activity, resulting in impaired functional mobility from baseline. Due to deficits, pt required supervision for bed mobility, minA for transfers with and without AD, and minA to ambulate 34ft BSC>recliner with RW. Increased cueing required throughout session for adherence to LLE NWB precautions. Decreased activity tolerance noted by O2 desaturation and tachycardia with upright mobility; see general comments section for details.   Deficits limit the pt's ability to safely and independently perform ADL's, transfer, and ambulate. Pt will benefit from acute skilled PT services to address deficits for return to baseline function. Pt will benefit from post acute  therapy services to address deficits for return to baseline function.  Encourage OOB mobility with nursing and mobility tech for meals and toileting for continued progress towards goals and maintenance of IND with functional mobility while hospitalized.          If plan is discharge home, recommend the following: A little help with walking and/or transfers;A little help with bathing/dressing/bathroom;Assistance with cooking/housework;Assist for transportation   Can travel by private vehicle   Yes    Equipment Recommendations  (defer to post acute)     Functional Status Assessment Patient has had a recent decline in their functional status and demonstrates the ability to make significant improvements in function in a reasonable and predictable amount of time.     Precautions / Restrictions Precautions Precautions: Fall Restrictions Weight Bearing Restrictions Per Provider Order: Yes LLE Weight Bearing Per Provider Order: Non weight bearing Other Position/Activity Restrictions: NWB to LLE until podiatry consult      Mobility  Bed Mobility         Supine to sit: Supervision     General bed mobility comments: supervision for safety to sit EOB, HOB elevated, increased time/effort and use of BUE for support    Transfers   Equipment used: Rolling walker (2 wheels)   Sit to Stand: Min assist           General transfer comment: minA for power to stand from EOB without AD and BSC with RW. multimodal cues for safety, sequencing, hand placement, and WB precautions. difficulty maintaining NWB to LLE without RW.    Ambulation/Gait Ambulation/Gait assistance: Min assist, Contact guard assist Gait Distance (Feet): 2 Feet           General Gait Details: CGA for balance with minA for RW management to take a few steps from  BSC>recliner with RW. demo's good adherence to LLE NWB precautions with minimal cues.    Balance Overall balance assessment: Needs assistance    Sitting balance-Leahy Scale: Normal Sitting balance - Comments: don socks EOB in figure 4 position without LOB   Standing balance support: Bilateral upper extremity supported, During functional activity Standing balance-Leahy Scale: Poor Standing balance comment: NWB to L foot, CGA-minA for standing balance with and without AD                             Pertinent Vitals/Pain Pain Assessment Pain Assessment: 0-10 Pain Score: 3  Pain Location: BLEs Pain Descriptors / Indicators: Sore Pain Intervention(s): Monitored during session, Repositioned    Home Living Family/patient expects to be discharged to:: Private residence Living Arrangements: Spouse/significant other Available Help at Discharge: Family;Available 24 hours/day Type of Home: House Home Access: Level entry       Home Layout: One level Home Equipment: Shower seat;Cane - single point Additional Comments: Has 2-3 steps inside the home down to the den with bilat rails but does not need to go to that room    Prior Function Prior Level of Function : Independent/Modified Independent             Mobility Comments: Ind amb in the home without AD, SPC in the community, no fall history in the last 6 months, 2LO2/min 24/7 at baseline ADLs Comments: Ind with ADLs and was primary caregiver for spouse who she reports is under hospice care.  Pt reports hospice now assists her spouse with his bath.     Extremity/Trunk Assessment   Upper Extremity Assessment Upper Extremity Assessment: Defer to OT evaluation    Lower Extremity Assessment Lower Extremity Assessment: Generalized weakness       Communication   Communication Communication: No apparent difficulties    Cognition Arousal: Alert Behavior During Therapy: WFL for tasks assessed/performed                             Following commands: Intact       Cueing Cueing Techniques: Verbal cues     General Comments General comments  (skin integrity, edema, etc.): L dorsal foot bruise with swelling. Desat to 87% on 2L via [place] after transfer to recliner; unable to recover with seated rest break and cues for PLB. Required titration to 2.5L for recovery >90%. HR elevated between 110-120 during session.    Exercises Other Exercises Other Exercises: Pt edu re: PT role/POC, DC recommendations, WB precautions, current level of mobility and assist/needs at DC, transfer/gait training with LLE NWB precautions, importance of WB precautions, OOB for meals/toileting, pursed lip breathing for O2 recovery, vitals during session, call for help. She verbalized understanding.   Assessment/Plan    PT Assessment Patient needs continued PT services  PT Problem List Decreased strength;Decreased activity tolerance;Decreased balance;Decreased mobility;Decreased knowledge of use of DME;Decreased safety awareness;Cardiopulmonary status limiting activity       PT Treatment Interventions DME instruction;Gait training;Functional mobility training;Therapeutic activities;Therapeutic exercise;Balance training;Patient/family education    PT Goals (Current goals can be found in the Care Plan section)  Acute Rehab PT Goals Patient Stated Goal: To get stronger PT Goal Formulation: With patient Time For Goal Achievement: 01/10/25 Potential to Achieve Goals: Good    Frequency Min 2X/week     Co-evaluation   Reason for Co-Treatment: Complexity of the patient's impairments (multi-system involvement);To address functional/ADL transfers  PT goals addressed during session: Mobility/safety with mobility;Balance;Proper use of DME         AM-PAC PT 6 Clicks Mobility  Outcome Measure Help needed turning from your back to your side while in a flat bed without using bedrails?: A Little Help needed moving from lying on your back to sitting on the side of a flat bed without using bedrails?: A Little Help needed moving to and from a bed to a chair (including  a wheelchair)?: A Little Help needed standing up from a chair using your arms (e.g., wheelchair or bedside chair)?: A Little Help needed to walk in hospital room?: A Little Help needed climbing 3-5 steps with a railing? : A Lot 6 Click Score: 17    End of Session Equipment Utilized During Treatment: Gait belt;Oxygen (2-2.5L) Activity Tolerance: Patient tolerated treatment well;Patient limited by fatigue Patient left: in chair;with call bell/phone within reach;with chair alarm set Nurse Communication: Mobility status PT Visit Diagnosis: Difficulty in walking, not elsewhere classified (R26.2);Muscle weakness (generalized) (M62.81);Unsteadiness on feet (R26.81)    Time: 9094-9063 PT Time Calculation (min) (ACUTE ONLY): 31 min   Charges:   PT Evaluation $PT Eval Moderate Complexity: 1 Mod PT Treatments $Therapeutic Activity: 8-22 mins PT General Charges $$ ACUTE PT VISIT: 1 Visit        Camie CHARLENA Kluver, PT, DPT 11:34 AM,12/27/2024 Physical Therapist - [facility]

## 2024-12-27 NOTE — Plan of Care (Signed)

## 2024-12-27 NOTE — Consult Note (Signed)
 PODIATRY / FOOT AND ANKLE SURGERY CONSULTATION NOTE  Requesting Physician: Dr. Leesa  Reason for consult: L foot injury  Chief Complaint: L foot pain   HPI: Alexandra [name] is a 84 y.o. female who presents with an injury to the left foot after sustaining fall due to syncope.  Patient was recently admitted to the hospital due to sepsis secondary to pneumonia and COPD exacerbation.  She has also had significant diarrhea and dehydration which caused her to have a syncopal episode in which she fell and injured her left foot.  Patient was admitted for the syncope episode and dehydration.  Podiatry team was consulted for further evaluation due to left foot injury.  She does have a history of idiopathic peripheral neuropathy and opioid dependence.  Does have a history of smoking but does not currently smoke.  She endorses some left foot pain at this time.  She notes that the injury happened 2 to 3 days ago.  PMHx:  Past Medical History:  Diagnosis Date   Anxiety    COPD (chronic obstructive pulmonary disease) (HCC)    Depression    GERD (gastroesophageal reflux disease)    Hyperlipidemia    Pulmonary nodules/lesions, multiple    Right femoral fracture [facility])     Surgical Hx:  Past Surgical History:  Procedure Laterality Date   ABDOMINAL HYSTERECTOMY  1975   COLONOSCOPY     COLOSTOMY  2012   repaired and reduced in 2013   intestinal rupture  2012   INTRAMEDULLARY (IM) NAIL INTERTROCHANTERIC Right 12/17/2021   Procedure: INTRAMEDULLARY (IM) NAIL INTERTROCHANTRIC;  Surgeon: Rollene Cough, MD;  Location: [facility] ORS;  Service: Orthopedics;  Laterality: Right;   TAKE DOWN OF INTESTINAL FISTULA  2013   TUMOR EXCISION  2013   hospitalized and drained.    VIDEO BRONCHOSCOPY WITH ENDOBRONCHIAL ULTRASOUND N/A 03/19/2023   Procedure: VIDEO BRONCHOSCOPY WITH ENDOBRONCHIAL ULTRASOUND;  Surgeon: Parris Manna, MD;  Location: [facility] ORS;  Service: Thoracic;  Laterality: N/A;    FHx:  Family History   Problem Relation Age of Onset   Heart disease Mother    Heart disease Father     Social History:  reports that she quit smoking about 14 years ago. Her smoking use included cigarettes. She started smoking about 51 years ago. She has a 37 pack-year smoking history. She has never used smokeless tobacco. She reports that she does not drink alcohol and does not use drugs.  Allergies: Allergies[1]  Medications Prior to Admission  Medication Sig Dispense Refill   albuterol  (PROVENTIL ) (2.5 MG/3ML) 0.083% nebulizer solution Take 3 mLs (2.5 mg total) by nebulization every 6 (six) hours as needed for Wheezing     albuterol  (VENTOLIN  HFA) 108 (90 Base) MCG/ACT inhaler Inhale 1-2 puffs into the lungs every 6 (six) hours as needed for wheezing or shortness of breath.     amitriptyline  (ELAVIL ) 50 MG tablet Take 50 mg by mouth at bedtime.     aspirin  81 MG chewable tablet Chew 81 mg by mouth daily.     benzonatate (TESSALON) 200 MG capsule Take 200 mg by mouth 3 (three) times daily as needed for cough.     celecoxib  (CELEBREX ) 200 MG capsule Take 200 mg by mouth 2 (two) times daily.     cholecalciferol  (VITAMIN D3) 25 MCG (1000 UNIT) tablet Take 1,000 Units by mouth daily.     CYCLOBENZAPRINE  HCL PO Take 1 tablet by mouth as needed.     diazepam  (VALIUM ) 5 MG tablet Take  1 tablet (5 mg) 30-60 minutes prior to Imaging. 1 tablet 0   gabapentin  (NEURONTIN ) 600 MG tablet Take 1,200 mg by mouth 3 (three) times daily.     HYDROcodone -acetaminophen  (NORCO) 10-325 MG tablet Take 1 tablet by mouth 4 (four) times daily as needed for moderate pain (pain score 4-6) or severe pain (pain score 7-10).     latanoprost  (XALATAN ) 0.005 % ophthalmic solution Place 1 drop into both eyes at bedtime.     loperamide  (IMODIUM ) 2 MG capsule Take 1 capsule (2 mg total) by mouth as needed for diarrhea or loose stools. 30 capsule 0   pantoprazole  (PROTONIX ) 40 MG tablet Take 40 mg by mouth 2 (two) times daily.     sertraline   (ZOLOFT ) 25 MG tablet Take 25 mg by mouth daily.     simvastatin  (ZOCOR ) 40 MG tablet Take 40 mg by mouth every evening.     TRELEGY ELLIPTA 200-62.5-25 MCG/ACT AEPB Inhale 1 puff into the lungs daily.     zolpidem  (AMBIEN ) 10 MG tablet Take 0.5 tablets (5 mg total) by mouth at bedtime. 30 tablet 0   OXYGEN Inhale 2 L into the lungs continuous.      Physical Exam: General: Alert and oriented.  No apparent distress.  Vascular: DP/PT pulses palpable bilateral, capillary fill time appears to be intact to digits bilaterally, no hair growth present to bilateral lower extremities, moderate edema present to the left midfoot with ecchymosis and some dry blood blister type appearance.  Neuro: Touch sensation reduced to bilateral lower extremities  Derm: Skin appears to be somewhat thinned and atrophic to the dorsal aspect of the left foot.  Does appear to have a fair amount of ecchymosis and swelling present to left midfoot with what appears to be dried blood blisters present to the area.  No obvious breakage in skin to the left dorsal foot.    MSK: Deferred range of motion palpation testing for left foot.  Clinically does not appear to have significant dislocation.  Results for orders placed or performed during the hospital encounter of 12/25/24 (from the past 48 hours)  CBC with Differential     Status: Abnormal   Collection Time: 12/25/24  8:55 PM  Result Value Ref Range   WBC 18.6 (H) 4.0 - 10.5 K/uL   RBC 3.63 (L) 3.87 - 5.11 MIL/uL   Hemoglobin 9.7 (L) 12.0 - 15.0 g/dL   HCT 67.1 (L) 63.9 - 53.9 %   MCV 90.4 80.0 - 100.0 fL   MCH 26.7 26.0 - 34.0 pg   MCHC 29.6 (L) 30.0 - 36.0 g/dL   RDW 85.9 88.4 - 84.4 %   Platelets 520 (H) 150 - 400 K/uL   nRBC 0.0 0.0 - 0.2 %   Neutrophils Relative % 85 %   Neutro Abs 16.0 (H) 1.7 - 7.7 K/uL   Lymphocytes Relative 4 %   Lymphs Abs 0.8 0.7 - 4.0 K/uL   Monocytes Relative 7 %   Monocytes Absolute 1.2 (H) 0.1 - 1.0 K/uL   Eosinophils Relative 2  %   Eosinophils Absolute 0.3 0.0 - 0.5 K/uL   Basophils Relative 0 %   Basophils Absolute 0.0 0.0 - 0.1 K/uL   Immature Granulocytes 2 %   Abs Immature Granulocytes 0.27 (H) 0.00 - 0.07 K/uL    Comment: Performed at [facility], [address] [place], [place] [address]  Comprehensive metabolic panel     Status: Abnormal   Collection Time: 12/25/24  8:55  PM  Result Value Ref Range   Sodium 137 135 - 145 mmol/L   Potassium 3.7 3.5 - 5.1 mmol/L   Chloride 93 (L) 98 - 111 mmol/L   CO2 31 22 - 32 mmol/L   Glucose, Bld 169 (H) 70 - 99 mg/dL    Comment: Glucose reference range applies only to samples taken after fasting for at least 8 hours.   BUN 32 (H) 8 - 23 mg/dL   Creatinine, Ser 8.32 (H) 0.44 - 1.00 mg/dL   Calcium 9.3 8.9 - 89.6 mg/dL   Total Protein 7.7 6.5 - 8.1 g/dL   Albumin 3.7 3.5 - 5.0 g/dL   AST 13 (L) 15 - 41 U/L   ALT 12 0 - 44 U/L   Alkaline Phosphatase 182 (H) 38 - 126 U/L   Total Bilirubin 0.3 0.0 - 1.2 mg/dL   GFR, Estimated 30 (L) >60 mL/min    Comment: (NOTE) Calculated using the CKD-EPI Creatinine Equation (2021)    Anion gap 14 5 - 15    Comment: Performed at [facility], [address] [place], [place] [address]  Troponin T, High Sensitivity     Status: Abnormal   Collection Time: 12/25/24  8:55 PM  Result Value Ref Range   Troponin T High Sensitivity 28 (H) 0 - 19 ng/L    Comment: (NOTE) Biotin concentrations > 1000 ng/mL falsely decrease TnT results.  Serial cardiac troponin measurements are suggested.  Refer to the Links section for chest pain algorithms and additional  guidance. Performed at [facility], [address] [place], [place] [address]   Troponin T, High Sensitivity     Status: Abnormal   Collection Time: 12/26/24 12:52 AM  Result Value Ref Range   Troponin T High Sensitivity 23 (H) 0 - 19 ng/L    Comment: (NOTE) Biotin concentrations > 1000 ng/mL falsely decrease TnT results.  Serial cardiac  troponin measurements are suggested.  Refer to the Links section for chest pain algorithms and additional  guidance. Performed at [facility], [address] [place], [place] [address]   Basic metabolic panel     Status: Abnormal   Collection Time: 12/26/24  3:24 AM  Result Value Ref Range   Sodium 138 135 - 145 mmol/L   Potassium 4.0 3.5 - 5.1 mmol/L   Chloride 98 98 - 111 mmol/L   CO2 28 22 - 32 mmol/L   Glucose, Bld 103 (H) 70 - 99 mg/dL    Comment: Glucose reference range applies only to samples taken after fasting for at least 8 hours.   BUN 30 (H) 8 - 23 mg/dL   Creatinine, Ser 8.75 (H) 0.44 - 1.00 mg/dL   Calcium 8.8 (L) 8.9 - 10.3 mg/dL   GFR, Estimated 43 (L) >60 mL/min    Comment: (NOTE) Calculated using the CKD-EPI Creatinine Equation (2021)    Anion gap 12 5 - 15    Comment: Performed at [facility], [address] [place], [place] [address]  CBC     Status: Abnormal   Collection Time: 12/26/24  3:24 AM  Result Value Ref Range   WBC 15.8 (H) 4.0 - 10.5 K/uL   RBC 3.43 (L) 3.87 - 5.11 MIL/uL   Hemoglobin 9.3 (L) 12.0 - 15.0 g/dL   HCT 66.6 (L) 63.9 - 53.9 %   MCV 97.1 80.0 - 100.0 fL    Comment: REPEATED TO VERIFY   MCH 27.1 26.0 - 34.0 pg  MCHC 27.9 (L) 30.0 - 36.0 g/dL   RDW 85.6 88.4 - 84.4 %   Platelets 334 150 - 400 K/uL   nRBC 0.0 0.0 - 0.2 %    Comment: Performed at [facility], [address] [place], [place] [address]  Troponin T, High Sensitivity     Status: Abnormal   Collection Time: 12/26/24  3:24 AM  Result Value Ref Range   Troponin T High Sensitivity 21 (H) 0 - 19 ng/L    Comment: (NOTE) Biotin concentrations > 1000 ng/mL falsely decrease TnT results.  Serial cardiac troponin measurements are suggested.  Refer to the Links section for chest pain algorithms and additional  guidance. Performed at [facility], [address] [place], [place] [address]   Troponin T, High Sensitivity      Status: Abnormal   Collection Time: 12/26/24  5:06 AM  Result Value Ref Range   Troponin T High Sensitivity 21 (H) 0 - 19 ng/L    Comment: (NOTE) Biotin concentrations > 1000 ng/mL falsely decrease TnT results.  Serial cardiac troponin measurements are suggested.  Refer to the Links section for chest pain algorithms and additional  guidance. Performed at [facility], [address] [place], [place] [address]   Comprehensive metabolic panel with GFR     Status: Abnormal   Collection Time: 12/27/24  5:34 AM  Result Value Ref Range   Sodium 143 135 - 145 mmol/L   Potassium 4.1 3.5 - 5.1 mmol/L   Chloride 104 98 - 111 mmol/L   CO2 34 (H) 22 - 32 mmol/L   Glucose, Bld 82 70 - 99 mg/dL    Comment: Glucose reference range applies only to samples taken after fasting for at least 8 hours.   BUN 18 8 - 23 mg/dL   Creatinine, Ser 9.24 0.44 - 1.00 mg/dL   Calcium 8.8 (L) 8.9 - 10.3 mg/dL   Total Protein 6.2 (L) 6.5 - 8.1 g/dL   Albumin 3.0 (L) 3.5 - 5.0 g/dL   AST 12 (L) 15 - 41 U/L   ALT 8 0 - 44 U/L   Alkaline Phosphatase 127 (H) 38 - 126 U/L   Total Bilirubin 0.2 0.0 - 1.2 mg/dL   GFR, Estimated >39 >39 mL/min    Comment: (NOTE) Calculated using the CKD-EPI Creatinine Equation (2021)    Anion gap 5 5 - 15    Comment: Performed at [facility], [address] [place], [place] [address]  CBC     Status: Abnormal   Collection Time: 12/27/24  5:34 AM  Result Value Ref Range   WBC 12.5 (H) 4.0 - 10.5 K/uL   RBC 2.87 (L) 3.87 - 5.11 MIL/uL   Hemoglobin 7.8 (L) 12.0 - 15.0 g/dL   HCT 73.4 (L) 63.9 - 53.9 %   MCV 92.3 80.0 - 100.0 fL   MCH 27.2 26.0 - 34.0 pg   MCHC 29.4 (L) 30.0 - 36.0 g/dL   RDW 85.5 88.4 - 84.4 %   Platelets 342 150 - 400 K/uL   nRBC 0.0 0.0 - 0.2 %    Comment: Performed at [facility], [address], [place], [place] [address]  Magnesium     Status: None   Collection Time: 12/27/24  5:34 AM  Result Value Ref Range    Magnesium 2.1 1.7 - 2.4 mg/dL    Comment: Performed at [facility], [address] [place], [place] [address]  Phosphorus  Status: None   Collection Time: 12/27/24  5:34 AM  Result Value Ref Range   Phosphorus 2.7 2.5 - 4.6 mg/dL    Comment: Performed at [facility], [address] [place], [place] [address]   CT HEAD WO CONTRAST ( ) Result Date: 12/26/2024 EXAM: CT HEAD WITHOUT CONTRAST 12/26/2024 11:13:00 AM TECHNIQUE: CT of the head was performed without the administration of intravenous contrast. Automated exposure control, iterative reconstruction, and/or weight based adjustment of the mA/kV was utilized to reduce the radiation dose to as low as reasonably achievable. COMPARISON: 10/27/2023 CLINICAL HISTORY: Syncope/presyncope, cerebrovascular cause suspected. FINDINGS: BRAIN AND VENTRICLES: No acute hemorrhage. No evidence of acute infarct. No hydrocephalus. No extra-axial collection. No mass effect or midline shift. Bilateral basal ganglia mineralization. Stable small pineal cyst. ORBITS: No acute abnormality. SINUSES: Left sphenoid sinus polypoid mucosal thickening. SOFT TISSUES AND SKULL: No acute soft tissue abnormality. No skull fracture. IMPRESSION: 1. No acute intracranial abnormality. Electronically signed by: Franky Stanford MD MD 12/26/2024 08:20 PM EST RP Workstation: [id number]   CT FOOT LEFT WO CONTRAST Result Date: 12/26/2024 CLINICAL DATA:  Pain.  Left foot injury after fall. EXAM: CT OF THE LEFT FOOT WITHOUT CONTRAST TECHNIQUE: Multidetector CT imaging of the left foot was performed according to the standard protocol. Multiplanar CT image reconstructions were also generated. RADIATION DOSE REDUCTION: This exam was performed according to the departmental dose-optimization program which includes automated exposure control, adjustment of the mA and/or kV according to patient size and/or use of iterative reconstruction technique. COMPARISON:  Left foot  radiographs dated 12/25/2024. FINDINGS: Bones/Joint/Cartilage Acute fractures at the level of the Lisfranc joint. Acute fracture of the lateral aspect of the medial cuneiform with the 1.2 x 1.2 x 0.5 cm fracture fragment demonstrating 2-3 mm of lateral distraction. Acute fracture of the dorsal medial base of the second metatarsal with a 1.0 x 1.0 x 0.7 cm fracture fragment demonstrating 5-6 mm of dorsal displacement. Mild widening of the Lisfranc joint. No evidence of significant malalignment or dislocation of the TMT joints. Mildly distracted acute fractures of the dorsolateral aspect of the intermediate and lateral cuneiforms. Small mildly distracted cortical avulsion fracture at the dorsolateral aspect of the cuboid. Acute nondisplaced fracture of the base of the first metatarsal. Mildly distracted fracture at the medial plantar aspect of the fourth metatarsal base. Well corticated ossicle adjacent to the tip of the medial malleolus. Ankle mortise appears congruent. Ligaments Ligaments are suboptimally evaluated by CT. Muscles and Tendons Visualized tendons appear grossly intact. Generalized atrophy of the musculature. Soft tissue edema about the midfoot. Soft tissue Subcutaneous edema of the dorsal midfoot and forefoot. IMPRESSION: 1. Acute fractures at the level of the Lisfranc joint. Acute displaced fracture of the dorsal medial base of the second metatarsal with a 1.0 x 1.0 x 0.7 cm fracture fragment. Acute mildly distracted fracture of the lateral aspect of the medial cuneiform with 1.2 x 1.2 x 0.5 cm fracture fragment. Mild widening of the Lisfranc joint. No evidence of significant malalignment or dislocation of the TMT joints. 2. Mildly distracted acute fractures of the dorsolateral aspect of the intermediate and lateral cuneiforms. 3. Small mildly distracted acute cortical avulsion fracture of the dorsolateral aspect of the cuboid. 4. Acute nondisplaced fracture of the base of the first metatarsal. 5.  Mildly distracted fracture of the medial plantar aspect of the fourth metatarsal base. 6. Soft tissue and subcutaneous edema about the midfoot. Electronically Signed   By: Harrietta Sherry M.D.   On: 12/26/2024  17:27   DG Foot Complete Left Result Date: 12/25/2024 EXAM: 3 OR MORE VIEW(S) XRAY OF THE LEFT FOOT 12/25/2024 11:20:25 PM COMPARISON: None available. CLINICAL HISTORY: trauma FINDINGS: BONES AND JOINTS: Abnormal alignment at the base of the first metatarsal and medial cuneiform concerning for Lisfranc injury. There may be congenital coalition of the medial and middle cuneiform bones, which could account for this. Possible fracture at the base of the 2nd metatarsal bone. Mild midfoot degenerative changes. Suggest CT or MRI for further evaluation. SOFT TISSUES: Dorsal forefoot subcutaneous soft tissue edema. IMPRESSION: 1. Abnormal alignment at the base of the first metatarsal and medial cuneiform concerning for Lisfranc injury, although congenital coalition of the medial and middle cuneiforms could account for this appearance. Possible fracture at the base of the 2nd metatarsal bone. Suggest CT or MRI for further evaluation. 2. Dorsal forefoot subcutaneous soft tissue edema. Electronically signed by: Elsie Gravely MD 12/25/2024 11:23 PM EST RP Workstation: [id number]   DG Chest Portable 1 View Result Date: 12/25/2024 CLINICAL DATA:  Syncope EXAM: PORTABLE CHEST 1 VIEW COMPARISON:  12/21/2024, chest CT 10/17/2024, chest x-ray 10/27/2023 FINDINGS: Emphysema. No acute airspace disease. Stable right apical opacity since chest CT from October. Stable cardiomediastinal silhouette. Chronic scarring at the right base. IMPRESSION: 1. Emphysema and suspected chronic scarring at the right base. 2. Stable right apical pleuroparenchymal opacity, reference chest CT from October Electronically Signed   By: Luke Bun M.D.   On: 12/25/2024 21:14    Blood pressure (!) 146/70, pulse (!) 106, temperature 98.5 F  (36.9 C), resp. rate 18, height 5' 9 (1.753 m), weight 59.9 kg, SpO2 93%.  Assessment Left Lisfranc fracture/dislocation  Plan -Patient seen and examined - X-ray and CT imaging reviewed and discussed with patient in detail.  Although has fractures in multiple places main fractures appear to be at the Lisfranc level involving the base of the second metatarsal which appears to be mild to moderately displaced and the lateral distal aspect of the medial cuneiform.  Fractures appear only mildly displaced. - Discussed treatment options with patient of both conservative and surgical attempts at correction. - Believe due to patient's age and comorbidities she would likely be best served with conservative care.  Discussed possibility of surgical intervention as well.  Patient is too swollen right now for any type of surgical intervention as well as has some skin changes to the dorsal foot that would make the procedure quite risky. - Recommend nonweightbearing for 6 to 8 weeks to the left foot.  Patient should try to keep the cam boot on for the most part at all times but may take it off if she would like at rest.  Patient should keep it on when up and moving around.  May use the heel for contact for transfers but should not be taking full steps through the foot.  Patient will be allowed to weight-bear again 6 to 8 weeks after further healing and then she will need fairly aggressive physical therapy and may begin ambulating in the boot for likely around 3 to 4 weeks before transitioning to normal supportive shoe. - Applied Ace wrap today.  Recommend RICE therapy with use of NSAID/Tylenol  as needed. - Appreciate medicine recommendations for DVT prophylaxis.  At minimum would recommend aspirin  81 mg twice daily if possible for DVT prophylaxis.  Patient is to work on knee flexion and extension exercises daily with calf massages.  Podiatry team to sign off at this time.  Patient to call outpatient  clinic to set up  appointment in the next 1 to 2 weeks for further evaluation after discharge.  Prentice Lee, DPM 12/27/2024, 2:27 PM         [1]  Allergies Allergen Reactions   Avelox [Moxifloxacin Hcl] Shortness Of Breath and Swelling   Moxifloxacin Anaphylaxis   Sulfa Antibiotics Anaphylaxis   Cefadroxil Itching   Elemental Sulfur Other (See Comments)    Reaction as a child and not sure what.

## 2024-12-27 NOTE — Progress Notes (Signed)
 " PROGRESS NOTE    Alexandra [name]  FMW:[id number] DOB: [birth year] DOA: 12/25/2024 PCP: Derick Leita POUR, MD  Chief Complaint  Patient presents with   Fall   Loss of Consciousness    Hospital Course:  Alexandra [name] is an 84yoF with [name], COPD, chronic oxygen dependence on 2 to 3 L O2, depression with anxiety, anemia, right lung cancer s/p radiation, idiopathic neuropathy, opioid dependence, prior perforated colon, who presents with syncope and diarrhea. Patient was hospitalized 1/4 through 1/7 with sepsis secondary to pneumonia and COPD exacerbation.  During the hospital stay states she completed a course of Rocephin  and azithromycin .  She also had diarrhea during that time which was thought to be secondary to antibiotic use. Upon discharge home she reports diarrhea has been persistent 4-5 times a day.  Prior to arrival she had syncopal episode while walking to the bathroom.  She injured her foot in the fall and now has pain in that area. On arrival she was found to have leukocytosis of 18.6, AKI with creatinine of 1.67, BUN 32, mildly tachycardic to 106, vitals otherwise WNL.  She is on her home O2 requirement.  CXR with emphysema and stable right apical pleural-parenchymal opacity.  X-ray of the left foot does show abnormal alignment at the base of the first metatarsal concerning for Lisfranc fracture.  CT confirms multiple fractures.  Patient will need outpatient follow-up for permanent fixation with podiatry in 2 weeks.  Subjective: Patient reports she is feeling mildly improved today.  Still having 4-5 bouts of diarrhea.  Reports she feels she cannot make it to the bathroom in time.  Objective: Vitals:   12/27/24 0449 12/27/24 0756 12/27/24 0808 12/27/24 1133  BP: (!) 143/68 (!) 143/74  (!) 146/70  Pulse: 85 100  (!) 106  Resp: 16 16  18   Temp: 98.8 F (37.1 C) 98.4 F (36.9 C)  98.5 F (36.9 C)  TempSrc: Oral     SpO2: 100% 93% 94% 93%  Weight:      Height:        Intake/Output Summary  (Last 24 hours) at 12/27/2024 1449 Last data filed at 12/27/2024 0617 Gross per 24 hour  Intake 977.72 ml  Output --  Net 977.72 ml   Filed Weights   12/25/24 2012  Weight: 59.9 kg    Examination: General exam: Appears calm and comfortable, NAD  Respiratory system: No work of breathing, symmetric chest wall expansion Cardiovascular system: S1 & S2 heard, RRR.  Gastrointestinal system: Abdomen is nondistended, soft and nontender.  Neuro: Alert and oriented. No focal neurological deficits. Extremities: Left foot with dorsal bruising, tender to palpation  Assessment & Plan:  Principal Problem:   Syncope Active Problems:   Fall at home, initial encounter   Diarrhea   Chronic obstructive pulmonary disease (COPD) (HCC)   Chronic hypoxic respiratory failure (HCC)   Myocardial injury   Hyperlipidemia   Malignant neoplasm of upper lobe of right lung (HCC)   AKI (acute kidney injury)   Opioid dependence (HCC)   Left foot pain   Anxiety and depression   Syncope and fall at home, initial encounter Orthostatic hypotension - Suspected secondary to dehydration from diarrhea - Patient very dehydrated on arrival - No focal neurodeficits - Head CT: without acute intracranial abnormality - Continue with IV fluids - Orthostatic vitals + lying 151/66, standing 79/43 - Continue with fall precautions - Continue with PT/OT  Diarrhea - IV fluids as above - C. difficile and GI PCR  ordered on arrival but were not performed as bedside RN reports that stool has been frequently mixed with urine and they have not been able to obtain sample.  Instructed RN to collect today even if there is some urine contamination - Will schedule Imodium  if pathogen panel is negative  Left foot fracture - CT scan confirms acute fractures at the level of Lisfranc joint, acute displaced fracture of the dorsal medial base of the second metatarsal, acute mildly distracted fracture of the lateral aspect of medial  cuneiform, mild widening of the Lisfranc joint, Mildly distracted acute fractures of the dorsal lateral aspect of the intermediate and lateral cuneiforms, mildly distracted acute cortical avulsion fracture of the dorsal lateral aspect of the cuboid, acute displaced fracture of the base of the first metatarsal, mildly distracted fracture of the medial plantar aspect of the fourth metatarsal. - Have consulted with podiatry Dr. Lennie who will plan for outpatient follow-up in 1 to 2 weeks for surgical fixation. - For now tall cam boot and NWB orders with heel contact only for transfers.  PT/OT ordered.  Will likely need home health at DC  COPD Chronic hypoxic respiratory failure - At baseline on 2 to 3 L O2.  Presently appears to be at her respiratory baseline - No evidence of respiratory distress.  Denies SOB this morning  Elevated troponin - Troponin 28.  Downtrending on repeat.  No chest pain.  Likely demand ischemia - Continue home aspirin  and statin  Hyperlipidemia - Statin  Malignant neoplasm of right upper lung, status postradiation therapy - Continue outpatient follow-up with oncology - Post radiation findings on CT/CXR  Leukocytosis - On arrival WBC 19, stool studies pending to evaluate for new infection.  No pneumonia on CXR.  May be reactive due to dehydration on arrival - Leukocytosis improving without intervention - Continue to monitor - Remains without fever.  No indication for antibiotics at this time  AKI - Likely secondary to dehydration from diarrhea - Baseline creatinine 0.71, creatinine on arrival 1.67 - AKI improving with IV fluids. - Cont to trend CMP - Avoid renal toxic medications  Chronic pain Opioid dependence - Continue home meds  Left foot pain - Plain film with possible Lisfranc, CT ordered to better evaluate - Will consult podiatry if positive for fracture - For now recommend nonweightbearing  Anxiety Depression - Continue home meds   DVT  prophylaxis: SCDs   Code Status: Full Code Disposition: Observation pending clinical resolution  Consultants:    Procedures:    Antimicrobials:  Anti-infectives (From admission, onward)    None       Data Reviewed: I have personally reviewed following labs and imaging studies CBC: Recent Labs  Lab 12/23/24 0424 12/24/24 0430 12/25/24 2055 12/26/24 0324 12/27/24 0534  WBC 19.4* 13.3* 18.6* 15.8* 12.5*  NEUTROABS  --   --  16.0*  --   --   HGB 8.7* 9.1* 9.7* 9.3* 7.8*  HCT 29.5* 30.7* 32.8* 33.3* 26.5*  MCV 91.6 91.1 90.4 97.1 92.3  PLT 513* 437* 520* 334 342   Basic Metabolic Panel: Recent Labs  Lab 12/21/24 1713 12/22/24 0502 12/22/24 0503 12/23/24 0422 12/23/24 0424 12/24/24 0430 12/25/24 2055 12/26/24 0324 12/27/24 0534  NA  --   --    < >  --  141 140 137 138 143  K  --   --    < >  --  3.4* 4.2 3.7 4.0 4.1  CL  --   --    < >  --  94* 96* 93* 98 104  CO2  --   --    < >  --  38* 37* 31 28 34*  GLUCOSE  --   --    < >  --  108* 85 169* 103* 82  BUN  --   --    < >  --  15 18 32* 30* 18  CREATININE  --   --    < >  --  0.79 0.71 1.67* 1.24* 0.75  CALCIUM  --   --    < >  --  9.5 9.8 9.3 8.8* 8.8*  MG 1.9 1.9  --  2.0  --  2.2  --   --  2.1  PHOS  --  3.6  --  3.8  --  3.0  --   --  2.7   < > = values in this interval not displayed.   GFR: Estimated Creatinine Clearance: 51.3 mL/min (by C-G formula based on SCr of 0.75 mg/dL). Liver Function Tests: Recent Labs  Lab 12/24/24 0430 12/25/24 2055 12/27/24 0534  AST  --  13* 12*  ALT  --  12 8  ALKPHOS  --  182* 127*  BILITOT  --  0.3 0.2  PROT  --  7.7 6.2*  ALBUMIN 3.5 3.7 3.0*   CBG: Recent Labs  Lab 12/22/24 0752 12/23/24 0802 12/24/24 0743  GLUCAP 127* 89 111*    Recent Results (from the past 240 hours)  Resp panel by RT-PCR (RSV, Flu A&B, Covid) Anterior Nasal Swab     Status: None   Collection Time: 12/21/24  4:10 PM   Specimen: Anterior Nasal Swab  Result Value Ref Range Status    SARS Coronavirus 2 by RT PCR NEGATIVE NEGATIVE Final    Comment: (NOTE) SARS-CoV-2 target nucleic acids are NOT DETECTED.  The SARS-CoV-2 RNA is generally detectable in upper respiratory specimens during the acute phase of infection. The lowest concentration of SARS-CoV-2 viral copies this assay can detect is 138 copies/mL. A negative result does not preclude SARS-Cov-2 infection and should not be used as the sole basis for treatment or other patient management decisions. A negative result may occur with  improper specimen collection/handling, submission of specimen other than nasopharyngeal swab, presence of viral mutation(s) within the areas targeted by this assay, and inadequate number of viral copies(<138 copies/mL). A negative result must be combined with clinical observations, patient history, and epidemiological information. The expected result is Negative.  Fact Sheet for Patients:  bloggercourse.com  Fact Sheet for Healthcare Providers:  seriousbroker.it  This test is no t yet approved or cleared by the [place]  FDA and  has been authorized for detection and/or diagnosis of SARS-CoV-2 by FDA under an Emergency Use Authorization (EUA). This EUA will remain  in effect (meaning this test can be used) for the duration of the COVID-19 declaration under Section 564(b)(1) of the Act, 21 U.S.C.section 360bbb-3(b)(1), unless the authorization is terminated  or revoked sooner.       Influenza A by PCR NEGATIVE NEGATIVE Final   Influenza B by PCR NEGATIVE NEGATIVE Final    Comment: (NOTE) The Xpert Xpress SARS-CoV-2/FLU/RSV plus assay is intended as an aid in the diagnosis of influenza from Nasopharyngeal swab specimens and should not be used as a sole basis for treatment. Nasal washings and aspirates are unacceptable for Xpert Xpress SARS-CoV-2/FLU/RSV testing.  Fact Sheet for  Patients: bloggercourse.com  Fact Sheet for Healthcare Providers: seriousbroker.it  This test is not  yet approved or cleared by the [place]  FDA and has been authorized for detection and/or diagnosis of SARS-CoV-2 by FDA under an Emergency Use Authorization (EUA). This EUA will remain in effect (meaning this test can be used) for the duration of the COVID-19 declaration under Section 564(b)(1) of the Act, 21 U.S.C. section 360bbb-3(b)(1), unless the authorization is terminated or revoked.     Resp Syncytial Virus by PCR NEGATIVE NEGATIVE Final    Comment: (NOTE) Fact Sheet for Patients: bloggercourse.com  Fact Sheet for Healthcare Providers: seriousbroker.it  This test is not yet approved or cleared by the [place]  FDA and has been authorized for detection and/or diagnosis of SARS-CoV-2 by FDA under an Emergency Use Authorization (EUA). This EUA will remain in effect (meaning this test can be used) for the duration of the COVID-19 declaration under Section 564(b)(1) of the Act, 21 U.S.C. section 360bbb-3(b)(1), unless the authorization is terminated or revoked.  Performed at [facility], [address] [place], [place] [address]   Blood culture (routine x 2)     Status: None   Collection Time: 12/21/24  4:10 PM   Specimen: BLOOD  Result Value Ref Range Status   Specimen Description BLOOD BLOOD RIGHT ARM  Final   Special Requests   Final    BOTTLES DRAWN AEROBIC AND ANAEROBIC Blood Culture adequate volume   Culture   Final    NO GROWTH 5 DAYS Performed at [facility], [address] [place], [place] [address]    Report Status 12/26/2024 FINAL  Final  Blood culture (routine x 2)     Status: None   Collection Time: 12/21/24  4:10 PM   Specimen: BLOOD  Result Value Ref Range Status   Specimen Description BLOOD BLOOD RIGHT ARM  Final    Special Requests   Final    BOTTLES DRAWN AEROBIC AND ANAEROBIC Blood Culture results may not be optimal due to an inadequate volume of blood received in culture bottles   Culture   Final    NO GROWTH 5 DAYS Performed at [facility], [address] [place], [place] [address]    Report Status 12/26/2024 FINAL  Final     Radiology Studies: CT HEAD WO CONTRAST ( ) Result Date: 12/26/2024 EXAM: CT HEAD WITHOUT CONTRAST 12/26/2024 11:13:00 AM TECHNIQUE: CT of the head was performed without the administration of intravenous contrast. Automated exposure control, iterative reconstruction, and/or weight based adjustment of the mA/kV was utilized to reduce the radiation dose to as low as reasonably achievable. COMPARISON: 10/27/2023 CLINICAL HISTORY: Syncope/presyncope, cerebrovascular cause suspected. FINDINGS: BRAIN AND VENTRICLES: No acute hemorrhage. No evidence of acute infarct. No hydrocephalus. No extra-axial collection. No mass effect or midline shift. Bilateral basal ganglia mineralization. Stable small pineal cyst. ORBITS: No acute abnormality. SINUSES: Left sphenoid sinus polypoid mucosal thickening. SOFT TISSUES AND SKULL: No acute soft tissue abnormality. No skull fracture. IMPRESSION: 1. No acute intracranial abnormality. Electronically signed by: Franky Stanford MD MD 12/26/2024 08:20 PM EST RP Workstation: [id number]   CT FOOT LEFT WO CONTRAST Result Date: 12/26/2024 CLINICAL DATA:  Pain.  Left foot injury after fall. EXAM: CT OF THE LEFT FOOT WITHOUT CONTRAST TECHNIQUE: Multidetector CT imaging of the left foot was performed according to the standard protocol. Multiplanar CT image reconstructions were also generated. RADIATION DOSE REDUCTION: This exam was performed according to the departmental dose-optimization program which includes automated exposure control, adjustment of the mA and/or kV according to patient size and/or use of iterative reconstruction  technique. COMPARISON:  Left  foot radiographs dated 12/25/2024. FINDINGS: Bones/Joint/Cartilage Acute fractures at the level of the Lisfranc joint. Acute fracture of the lateral aspect of the medial cuneiform with the 1.2 x 1.2 x 0.5 cm fracture fragment demonstrating 2-3 mm of lateral distraction. Acute fracture of the dorsal medial base of the second metatarsal with a 1.0 x 1.0 x 0.7 cm fracture fragment demonstrating 5-6 mm of dorsal displacement. Mild widening of the Lisfranc joint. No evidence of significant malalignment or dislocation of the TMT joints. Mildly distracted acute fractures of the dorsolateral aspect of the intermediate and lateral cuneiforms. Small mildly distracted cortical avulsion fracture at the dorsolateral aspect of the cuboid. Acute nondisplaced fracture of the base of the first metatarsal. Mildly distracted fracture at the medial plantar aspect of the fourth metatarsal base. Well corticated ossicle adjacent to the tip of the medial malleolus. Ankle mortise appears congruent. Ligaments Ligaments are suboptimally evaluated by CT. Muscles and Tendons Visualized tendons appear grossly intact. Generalized atrophy of the musculature. Soft tissue edema about the midfoot. Soft tissue Subcutaneous edema of the dorsal midfoot and forefoot. IMPRESSION: 1. Acute fractures at the level of the Lisfranc joint. Acute displaced fracture of the dorsal medial base of the second metatarsal with a 1.0 x 1.0 x 0.7 cm fracture fragment. Acute mildly distracted fracture of the lateral aspect of the medial cuneiform with 1.2 x 1.2 x 0.5 cm fracture fragment. Mild widening of the Lisfranc joint. No evidence of significant malalignment or dislocation of the TMT joints. 2. Mildly distracted acute fractures of the dorsolateral aspect of the intermediate and lateral cuneiforms. 3. Small mildly distracted acute cortical avulsion fracture of the dorsolateral aspect of the cuboid. 4. Acute nondisplaced fracture of the base of the first metatarsal.  5. Mildly distracted fracture of the medial plantar aspect of the fourth metatarsal base. 6. Soft tissue and subcutaneous edema about the midfoot. Electronically Signed   By: Harrietta Sherry M.D.   On: 12/26/2024 17:27   DG Foot Complete Left Result Date: 12/25/2024 EXAM: 3 OR MORE VIEW(S) XRAY OF THE LEFT FOOT 12/25/2024 11:20:25 PM COMPARISON: None available. CLINICAL HISTORY: trauma FINDINGS: BONES AND JOINTS: Abnormal alignment at the base of the first metatarsal and medial cuneiform concerning for Lisfranc injury. There may be congenital coalition of the medial and middle cuneiform bones, which could account for this. Possible fracture at the base of the 2nd metatarsal bone. Mild midfoot degenerative changes. Suggest CT or MRI for further evaluation. SOFT TISSUES: Dorsal forefoot subcutaneous soft tissue edema. IMPRESSION: 1. Abnormal alignment at the base of the first metatarsal and medial cuneiform concerning for Lisfranc injury, although congenital coalition of the medial and middle cuneiforms could account for this appearance. Possible fracture at the base of the 2nd metatarsal bone. Suggest CT or MRI for further evaluation. 2. Dorsal forefoot subcutaneous soft tissue edema. Electronically signed by: Elsie Gravely MD 12/25/2024 11:23 PM EST RP Workstation: [id number]   DG Chest Portable 1 View Result Date: 12/25/2024 CLINICAL DATA:  Syncope EXAM: PORTABLE CHEST 1 VIEW COMPARISON:  12/21/2024, chest CT 10/17/2024, chest x-ray 10/27/2023 FINDINGS: Emphysema. No acute airspace disease. Stable right apical opacity since chest CT from October. Stable cardiomediastinal silhouette. Chronic scarring at the right base. IMPRESSION: 1. Emphysema and suspected chronic scarring at the right base. 2. Stable right apical pleuroparenchymal opacity, reference chest CT from October Electronically Signed   By: Luke Bun M.D.   On: 12/25/2024 21:14    Scheduled Meds:  amitriptyline   50 mg Oral QHS   aspirin   81  mg Oral Daily   budesonide -glycopyrrolate -formoterol   2 puff Inhalation BID   gabapentin   1,200 mg Oral TID   ipratropium-albuterol   3 mL Nebulization BID   latanoprost   1 drop Both Eyes QHS   pantoprazole   40 mg Oral BID   sertraline   25 mg Oral Daily   simvastatin   40 mg Oral QPM   zolpidem   5 mg Oral QHS   Continuous Infusions:  sodium chloride  75 mL/hr at 12/27/24 1055     LOS: 0 days  MDM: Patient is high risk for one or more organ failure.  They necessitate ongoing hospitalization for continued IV therapies and subsequent lab monitoring. Total time spent interpreting labs and vitals, reviewing the medical record, coordinating care amongst consultants and care team members, directly assessing and discussing care with the patient and/or family: 55 min  Adhrit Krenz, DO [facility]  To contact the attending physician between 7A-7P please use Epic Chat. To contact the covering physician during after hours 7P-7A, please review Amion.  12/27/2024, 2:49 PM   *This document has been created with the assistance of dictation software. Please excuse typographical errors. *   "

## 2024-12-27 NOTE — Evaluation (Signed)
 Occupational Therapy Evaluation Patient Details Name: Alexandra [name] MRN: [id number] DOB: 03/22/1941 Today's Date: 12/27/2024   History of Present Illness   Pt admitted to [facility] on 12/25/24 under observation for c/o syncope and falls. Imaging significant for:  possible Lisfranc injury and possible fracture at the base of the 2nd metatarsal bone. Elevated troponin due to demand ischemia. Recent hospital admission (1/4-1/7) for sepsis secondary to PNA and COPD exacerbation. Significant PMH includes: [name], COPD on 2-3 L O2, depression with anxiety, anemia, right lung cancer (s/p of radiation therapy), idiopathic neuropathy, opioid dependence, perforated colon.     Clinical Impressions Pt in bed upon OT arrival and agreeable to OT/PT co-eval.  Pt endorses minimal pain in L foot at start, increasing only slightly during OOB activity while maintaining NWB to the L foot.  Pt continues to report abdominal discomfort and had loose stool using BSC.  Bed mobility with supv, and min A for STS and step pivot with max vc and demo for maintaining NWB to L foot.  Though min A for transfers, transfers noted to be quite taxing, with pt requiring increase from 2 to 2L 02 and still with desat to 87%, requiring vc for PLB.  HR elevated at rest <120>110.  Pt eager to return home, as she reports spouse is currently under hospice care.  Concern for low activity tolerance and high fall risk d/t current NWB status to the LLE.  Pt will benefit from continued skilled OT to maximize safety, tolerance, and indep with ADLs and functional mobility in order to reduce rehospitalization risk and reduce physical burden of care on family.       If plan is discharge home, recommend the following:   Assistance with cooking/housework;A lot of help with walking and/or transfers;A lot of help with bathing/dressing/bathroom;Assist for transportation;Help with stairs or ramp for entrance     Functional Status Assessment   Patient has had a  recent decline in their functional status and demonstrates the ability to make significant improvements in function in a reasonable and predictable amount of time.     Equipment Recommendations   Other (comment) (defer to next venue of care)     Recommendations for Other Services         Precautions/Restrictions   Precautions Precautions: Fall Restrictions Weight Bearing Restrictions Per Provider Order: Yes Other Position/Activity Restrictions: NWB to LLE until podiatry consult     Mobility Bed Mobility Overal bed mobility: Needs Assistance Bed Mobility: Supine to Sit     Supine to sit: Supervision       Patient Response: Cooperative  Transfers Overall transfer level: Needs assistance Equipment used: Rolling walker (2 wheels) Transfers: Sit to/from Stand Sit to Stand: Min assist                  Balance Overall balance assessment: Needs assistance   Sitting balance-Leahy Scale: Normal     Standing balance support: Bilateral upper extremity supported, During functional activity Standing balance-Leahy Scale: Poor Standing balance comment: NWB to L foot                           ADL either performed or assessed with clinical judgement   ADL Overall ADL's : Needs assistance/impaired     Grooming: Sitting;Wash/dry hands;Set up               Lower Body Dressing: Moderate assistance;Sit to/from stand Lower Body Dressing Details (indicate cue type and reason):  assist to lower and hike panties in prep for toileting; assist to don L sock Toilet Transfer: BSC/3in1;Rolling walker (2 wheels);Minimal assistance Toilet Transfer Details (indicate cue type and reason): assist to maintain NWB to L foot Toileting- Clothing Manipulation and Hygiene: Sit to/from stand;Maximal assistance Toileting - Clothing Manipulation Details (indicate cue type and reason): assist to lower and hike panties in standing, but able to perform peri care after a BM  with set up while seated on 3in1 using lateral WS     Functional mobility during ADLs: Minimal assistance;Rolling walker (2 wheels) General ADL Comments: Able to perform step pivot transfer bed>3in1 for toileting, then 3in1>recliner with max vc for sequencing and maintaining NWB to the LLE.     Vision Patient Visual Report: No change from baseline                         Pertinent Vitals/Pain Pain Assessment Pain Assessment: 0-10 Pain Score: 3  Pain Location: BLEs Pain Descriptors / Indicators: Sore Pain Intervention(s): Monitored during session, Repositioned     Extremity/Trunk Assessment Upper Extremity Assessment Upper Extremity Assessment: Defer to OT evaluation   Lower Extremity Assessment Lower Extremity Assessment: Generalized weakness       Communication Communication Communication: No apparent difficulties   Cognition Arousal: Alert Behavior During Therapy: WFL for tasks assessed/performed                                 Following commands: Intact       Cueing  General Comments   Cueing Techniques: Verbal cues  L dorsal foot bruise with swelling. Desat to 87% on 2L via [place] after transfer to recliner; unable to recover with seated rest break and cues for PLB. Required titration to 2.5L for recovery >90%. HR elevated between 110-120 during session.   Exercises           Home Living Family/patient expects to be discharged to:: Private residence Living Arrangements: Spouse/significant other Available Help at Discharge: Family;Available 24 hours/day Type of Home: House Home Access: Level entry     Home Layout: One level     Bathroom Shower/Tub: Producer, Television/film/video: Handicapped height     Home Equipment: Production Assistant, Radio - single point   Additional Comments: Has 2-3 steps inside the home down to the den with bilat rails but does not need to go to that room      Prior Functioning/Environment Prior Level of  Function : Independent/Modified Independent             Mobility Comments: Ind amb in the home without AD, SPC in the community, no fall history in the last 6 months, 2LO2/min 24/7 at baseline ADLs Comments: Ind with ADLs and was primary caregiver for spouse who she reports is under hospice care.  Pt reports hospice now assists her spouse with his bath.    OT Problem List: Decreased strength;Impaired balance (sitting and/or standing);Decreased knowledge of precautions;Pain;Decreased safety awareness;Decreased activity tolerance;Decreased knowledge of use of DME or AE   OT Treatment/Interventions: Self-care/ADL training;Therapeutic exercise;Patient/family education;Balance training;Energy conservation;Therapeutic activities;DME and/or AE instruction      OT Goals(Current goals can be found in the care plan section)   Acute Rehab OT Goals Patient Stated Goal: To go home OT Goal Formulation: With patient Time For Goal Achievement: 01/10/25 Potential to Achieve Goals: Fair ADL Goals Pt Will Perform Lower Body Dressing: sit  to/from stand;with supervision Pt Will Transfer to Toilet: with supervision;stand pivot transfer;bedside commode Pt Will Perform Toileting - Clothing Manipulation and hygiene: with supervision;sitting/lateral leans;sit to/from stand   OT Frequency:  Min 2X/week    Co-evaluation PT/OT/SLP Co-Evaluation/Treatment: Yes Reason for Co-Treatment: Complexity of the patient's impairments (multi-system involvement);To address functional/ADL transfers PT goals addressed during session: Mobility/safety with mobility;Balance;Proper use of DME        AM-PAC OT 6 Clicks Daily Activity     Outcome Measure Help from another person eating meals?: None Help from another person taking care of personal grooming?: A Little Help from another person toileting, which includes using toliet, bedpan, or urinal?: A Lot Help from another person bathing (including washing, rinsing,  drying)?: A Lot Help from another person to put on and taking off regular upper body clothing?: A Little Help from another person to put on and taking off regular lower body clothing?: A Lot 6 Click Score: 16   End of Session Equipment Utilized During Treatment: Rolling walker (2 wheels);Oxygen Nurse Communication: Mobility status  Activity Tolerance: Other (comment) (limited by cardio/pulmonary status) Patient left: with chair alarm set;in chair;with call bell/phone within reach  OT Visit Diagnosis: Muscle weakness (generalized) (M62.81);History of falling (Z91.81);Unsteadiness on feet (R26.81)                Time: 9094-9063 OT Time Calculation (min): 31 min Charges:  OT General Charges $OT Visit: 1 Visit OT Evaluation $OT Eval Low Complexity: 1 Low $OT Eval Moderate Complexity: 1 Mod  Alexandra Blazing, MS, OTR/L   Alexandra [name] 12/27/2024, 11:33 AM

## 2024-12-28 DIAGNOSIS — D509 Iron deficiency anemia, unspecified: Secondary | ICD-10-CM | POA: Diagnosis present

## 2024-12-28 DIAGNOSIS — F1129 Opioid dependence with unspecified opioid-induced disorder: Secondary | ICD-10-CM | POA: Diagnosis not present

## 2024-12-28 DIAGNOSIS — Z1152 Encounter for screening for COVID-19: Secondary | ICD-10-CM | POA: Diagnosis not present

## 2024-12-28 DIAGNOSIS — Z7982 Long term (current) use of aspirin: Secondary | ICD-10-CM | POA: Diagnosis not present

## 2024-12-28 DIAGNOSIS — E860 Dehydration: Secondary | ICD-10-CM | POA: Diagnosis present

## 2024-12-28 DIAGNOSIS — I951 Orthostatic hypotension: Secondary | ICD-10-CM | POA: Diagnosis present

## 2024-12-28 DIAGNOSIS — Z882 Allergy status to sulfonamides status: Secondary | ICD-10-CM | POA: Diagnosis not present

## 2024-12-28 DIAGNOSIS — J439 Emphysema, unspecified: Secondary | ICD-10-CM | POA: Diagnosis present

## 2024-12-28 DIAGNOSIS — N179 Acute kidney failure, unspecified: Secondary | ICD-10-CM | POA: Diagnosis present

## 2024-12-28 DIAGNOSIS — Z923 Personal history of irradiation: Secondary | ICD-10-CM | POA: Diagnosis not present

## 2024-12-28 DIAGNOSIS — C3411 Malignant neoplasm of upper lobe, right bronchus or lung: Secondary | ICD-10-CM | POA: Diagnosis present

## 2024-12-28 DIAGNOSIS — G8929 Other chronic pain: Secondary | ICD-10-CM | POA: Diagnosis present

## 2024-12-28 DIAGNOSIS — F1120 Opioid dependence, uncomplicated: Secondary | ICD-10-CM | POA: Diagnosis present

## 2024-12-28 DIAGNOSIS — Y92009 Unspecified place in unspecified non-institutional (private) residence as the place of occurrence of the external cause: Secondary | ICD-10-CM | POA: Diagnosis not present

## 2024-12-28 DIAGNOSIS — D649 Anemia, unspecified: Secondary | ICD-10-CM | POA: Diagnosis not present

## 2024-12-28 DIAGNOSIS — I5A Non-ischemic myocardial injury (non-traumatic): Secondary | ICD-10-CM | POA: Diagnosis present

## 2024-12-28 DIAGNOSIS — W19XXXA Unspecified fall, initial encounter: Secondary | ICD-10-CM | POA: Diagnosis present

## 2024-12-28 DIAGNOSIS — Z9981 Dependence on supplemental oxygen: Secondary | ICD-10-CM | POA: Diagnosis not present

## 2024-12-28 DIAGNOSIS — I2489 Other forms of acute ischemic heart disease: Secondary | ICD-10-CM | POA: Diagnosis present

## 2024-12-28 DIAGNOSIS — R55 Syncope and collapse: Secondary | ICD-10-CM | POA: Diagnosis present

## 2024-12-28 DIAGNOSIS — Z79899 Other long term (current) drug therapy: Secondary | ICD-10-CM | POA: Diagnosis not present

## 2024-12-28 DIAGNOSIS — R197 Diarrhea, unspecified: Secondary | ICD-10-CM | POA: Diagnosis present

## 2024-12-28 DIAGNOSIS — S92232D Displaced fracture of intermediate cuneiform of left foot, subsequent encounter for fracture with routine healing: Secondary | ICD-10-CM | POA: Diagnosis not present

## 2024-12-28 DIAGNOSIS — J449 Chronic obstructive pulmonary disease, unspecified: Secondary | ICD-10-CM | POA: Diagnosis present

## 2024-12-28 DIAGNOSIS — Z8249 Family history of ischemic heart disease and other diseases of the circulatory system: Secondary | ICD-10-CM | POA: Diagnosis not present

## 2024-12-28 DIAGNOSIS — E785 Hyperlipidemia, unspecified: Secondary | ICD-10-CM | POA: Diagnosis present

## 2024-12-28 DIAGNOSIS — J9611 Chronic respiratory failure with hypoxia: Secondary | ICD-10-CM | POA: Diagnosis present

## 2024-12-28 DIAGNOSIS — F32A Depression, unspecified: Secondary | ICD-10-CM | POA: Diagnosis present

## 2024-12-28 DIAGNOSIS — F419 Anxiety disorder, unspecified: Secondary | ICD-10-CM | POA: Diagnosis present

## 2024-12-28 DIAGNOSIS — Z87891 Personal history of nicotine dependence: Secondary | ICD-10-CM | POA: Diagnosis not present

## 2024-12-28 LAB — IRON AND TIBC
Iron: 13 ug/dL — ABNORMAL LOW (ref 28–170)
Saturation Ratios: 5 % — ABNORMAL LOW (ref 10.4–31.8)
TIBC: 246 ug/dL — ABNORMAL LOW (ref 250–450)
UIBC: 234 ug/dL

## 2024-12-28 LAB — CBC WITH DIFFERENTIAL/PLATELET
Abs Immature Granulocytes: 0.12 K/uL — ABNORMAL HIGH (ref 0.00–0.07)
Basophils Absolute: 0 K/uL (ref 0.0–0.1)
Basophils Relative: 0 %
Eosinophils Absolute: 0.3 K/uL (ref 0.0–0.5)
Eosinophils Relative: 3 %
HCT: 24.4 % — ABNORMAL LOW (ref 36.0–46.0)
Hemoglobin: 7.1 g/dL — ABNORMAL LOW (ref 12.0–15.0)
Immature Granulocytes: 1 %
Lymphocytes Relative: 9 %
Lymphs Abs: 0.9 K/uL (ref 0.7–4.0)
MCH: 27.1 pg (ref 26.0–34.0)
MCHC: 29.1 g/dL — ABNORMAL LOW (ref 30.0–36.0)
MCV: 93.1 fL (ref 80.0–100.0)
Monocytes Absolute: 1 K/uL (ref 0.1–1.0)
Monocytes Relative: 10 %
Neutro Abs: 8 K/uL — ABNORMAL HIGH (ref 1.7–7.7)
Neutrophils Relative %: 77 %
Platelets: 305 K/uL (ref 150–400)
RBC: 2.62 MIL/uL — ABNORMAL LOW (ref 3.87–5.11)
RDW: 14.6 % (ref 11.5–15.5)
WBC: 10.4 K/uL (ref 4.0–10.5)
nRBC: 0 % (ref 0.0–0.2)

## 2024-12-28 LAB — COMPREHENSIVE METABOLIC PANEL WITH GFR
ALT: 7 U/L (ref 0–44)
AST: 10 U/L — ABNORMAL LOW (ref 15–41)
Albumin: 3 g/dL — ABNORMAL LOW (ref 3.5–5.0)
Alkaline Phosphatase: 114 U/L (ref 38–126)
Anion gap: 5 (ref 5–15)
BUN: 9 mg/dL (ref 8–23)
CO2: 34 mmol/L — ABNORMAL HIGH (ref 22–32)
Calcium: 8.7 mg/dL — ABNORMAL LOW (ref 8.9–10.3)
Chloride: 102 mmol/L (ref 98–111)
Creatinine, Ser: 0.65 mg/dL (ref 0.44–1.00)
GFR, Estimated: >60 mL/min
Glucose, Bld: 84 mg/dL (ref 70–99)
Potassium: 4 mmol/L (ref 3.5–5.1)
Sodium: 140 mmol/L (ref 135–145)
Total Bilirubin: 0.3 mg/dL (ref 0.0–1.2)
Total Protein: 6.1 g/dL — ABNORMAL LOW (ref 6.5–8.1)

## 2024-12-28 LAB — RETICULOCYTES
Immature Retic Fract: 11.6 % (ref 2.3–15.9)
RBC.: 2.85 MIL/uL — ABNORMAL LOW (ref 3.87–5.11)
Retic Count, Absolute: 42.7 K/uL (ref 19.0–186.0)
Retic Ct Pct: 1.5 % (ref 0.4–3.1)

## 2024-12-28 LAB — HEMOGLOBIN AND HEMATOCRIT, BLOOD
HCT: 26.2 % — ABNORMAL LOW (ref 36.0–46.0)
Hemoglobin: 7.7 g/dL — ABNORMAL LOW (ref 12.0–15.0)

## 2024-12-28 LAB — VITAMIN B12: Vitamin B-12: 751 pg/mL (ref 180–914)

## 2024-12-28 LAB — FERRITIN: Ferritin: 99 ng/mL (ref 11–307)

## 2024-12-28 LAB — FOLATE: Folate: 8.2 ng/mL

## 2024-12-28 MED ORDER — FERROUS SULFATE 325 (65 FE) MG PO TABS
325.0000 mg | ORAL_TABLET | Freq: Every day | ORAL | Status: DC
  Administered 2024-12-28 – 2024-12-31 (×4): 325 mg via ORAL
  Filled 2024-12-28 (×4): qty 1

## 2024-12-28 NOTE — Progress Notes (Signed)
 " PROGRESS NOTE    Alexandra [name]  FMW:[id number] DOB: 08/13/1941 DOA: 12/25/2024 PCP: Derick Leita POUR, MD  Chief Complaint  Patient presents with   Fall   Loss of Consciousness    Hospital Course:  Alexandra [name] is an 84yoF with [name], COPD, chronic oxygen dependence on 2 to 3 L O2, depression with anxiety, anemia, right lung cancer s/p radiation, idiopathic neuropathy, opioid dependence, prior perforated colon, who presents with syncope and diarrhea. Patient was hospitalized 1/4 through 1/7 with sepsis secondary to pneumonia and COPD exacerbation.  During the hospital stay states she completed a course of Rocephin  and azithromycin .  She also had diarrhea during that time which was thought to be secondary to antibiotic use. Upon discharge home she reports diarrhea has been persistent 4-5 times a day.  Prior to arrival she had syncopal episode while walking to the bathroom.  She injured her foot in the fall and now has pain in that area. On arrival she was found to have leukocytosis of 18.6, AKI with creatinine of 1.67, BUN 32, mildly tachycardic to 106, vitals otherwise WNL.  She is on her home O2 requirement.  CXR with emphysema and stable right apical pleural-parenchymal opacity.  X-ray of the left foot does show abnormal alignment at the base of the first metatarsal concerning for Lisfranc fracture.  CT confirms multiple fractures.  Patient will need outpatient follow-up for permanent fixation with podiatry in 2 weeks.  Subjective: Patient very drowsy evaluation this morning.  Endorses continued issues with frequent bowel movements overnight.  We discussed that her hemoglobin is downtrending.  She maintains that she has not seen any dark or bloody stools.  She consents to transfusion  Objective: Vitals:   12/28/24 0353 12/28/24 0750 12/28/24 0814 12/28/24 1147  BP: 130/61 (!) 152/87  (!) 148/56  Pulse: 82 88  88  Resp: 18 18  16   Temp: 98 F (36.7 C) 98.8 F (37.1 C)  98.7 F (37.1 C)   TempSrc: Oral Oral  Oral  SpO2: 98% 100% 99% 95%  Weight:      Height:        Intake/Output Summary (Last 24 hours) at 12/28/2024 1216 Last data filed at 12/28/2024 1140 Gross per 24 hour  Intake 2127.94 ml  Output --  Net 2127.94 ml   Filed Weights   12/25/24 2012  Weight: 59.9 kg    Examination: General exam: Appears calm and comfortable, NAD  Respiratory system: No work of breathing, symmetric chest wall expansion Cardiovascular system: S1 & S2 heard, RRR.  Gastrointestinal system: Abdomen is nondistended, soft and nontender.  Neuro: Very drowsy but arousable and oriented when prompted Extremities: Left foot with dorsal bruising, tender to palpation  Assessment & Plan:  Principal Problem:   Syncope Active Problems:   Fall at home, initial encounter   Diarrhea   Chronic obstructive pulmonary disease (COPD) (HCC)   Chronic hypoxic respiratory failure (HCC)   Myocardial injury   Hyperlipidemia   Malignant neoplasm of upper lobe of right lung (HCC)   AKI (acute kidney injury)   Opioid dependence (HCC)   Left foot pain   Anxiety and depression   Syncope and fall at home, initial encounter Orthostatic hypotension - Suspected secondary to dehydration from diarrhea - Patient very dehydrated on arrival - Has been on IV fluids, will discontinue now given she is tolerating p.o. and may be becoming volume overloaded - No focal neurodeficits - Head CT: without acute intracranial abnormality - Orthostatic vitals  positive: Lying 151/66, standing 79/43 - Continue with fall precautions and PT/OT  Diarrhea - C. difficile and GI PCR ordered but still pending results - Will schedule Imodium  if pathogen panel is negative  Acute on chronic anemia - Baseline hemoglobin 8-9, has been gradually downtrending. - No acute blood loss appreciated.  May be hemodilutional but she is nearing the need for transfusion - IV fluids have been discontinued - Anemia panel ordered - Repeat  H&H this afternoon.  If hemoglobin under 7 we will proceed with transfusion.  Patient has consented to such  Left foot fracture - CT scan confirms acute fractures at the level of Lisfranc joint, acute displaced fracture of the dorsal medial base of the second metatarsal, acute mildly distracted fracture of the lateral aspect of medial cuneiform, mild widening of the Lisfranc joint, Mildly distracted acute fractures of the dorsal lateral aspect of the intermediate and lateral cuneiforms, mildly distracted acute cortical avulsion fracture of the dorsal lateral aspect of the cuboid, acute displaced fracture of the base of the first metatarsal, mildly distracted fracture of the medial plantar aspect of the fourth metatarsal. - Have consulted with podiatry Dr. Lennie who will plan for outpatient follow-up in 1 to 2 weeks for surgical fixation. - For now tall cam boot and NWB orders with heel contact only for transfers.  PT/OT ordered.  Will likely need home health at DC  COPD Chronic hypoxic respiratory failure - At baseline on 2 to 3 L O2.  Presently appears to be at her respiratory baseline - No evidence of respiratory distress.  Denies shortness of breath  Elevated troponin - Troponin 28.  Downtrending on repeat.  No chest pain.  Likely demand ischemia - Continue home aspirin  and statin  Hyperlipidemia - Statin  Malignant neoplasm of right upper lung, status postradiation therapy - Continue outpatient follow-up with oncology - Post radiation findings on CT/CXR  Leukocytosis - On arrival WBC 19, stool studies pending to evaluate for new infection.  No pneumonia on CXR.  May be reactive due to dehydration on arrival - Cytosis improved without antibiotics - Continue to monitor CBC, remains afebrile  AKI, resolved - Likely secondary to dehydration from diarrhea - Baseline creatinine 0.71, creatinine on arrival 1.67 - AKI improving with IV fluids. - Cont to trend CMP - Avoid renal toxic  medications  Chronic pain Opioid dependence - Continue home meds  Anxiety Depression - Continue home meds   DVT prophylaxis: SCDs   Code Status: Full Code Disposition: Observation pending clinical resolution  Consultants:    Procedures:    Antimicrobials:  Anti-infectives (From admission, onward)    None       Data Reviewed: I have personally reviewed following labs and imaging studies CBC: Recent Labs  Lab 12/24/24 0430 12/25/24 2055 12/26/24 0324 12/27/24 0534 12/28/24 0828  WBC 13.3* 18.6* 15.8* 12.5* 10.4  NEUTROABS  --  16.0*  --   --  8.0*  HGB 9.1* 9.7* 9.3* 7.8* 7.1*  HCT 30.7* 32.8* 33.3* 26.5* 24.4*  MCV 91.1 90.4 97.1 92.3 93.1  PLT 437* 520* 334 342 305   Basic Metabolic Panel: Recent Labs  Lab 12/21/24 1713 12/22/24 0502 12/22/24 0503 12/23/24 0422 12/23/24 0424 12/24/24 0430 12/25/24 2055 12/26/24 0324 12/27/24 0534 12/28/24 0828  NA  --   --    < >  --    < > 140 137 138 143 140  K  --   --    < >  --    < >  4.2 3.7 4.0 4.1 4.0  CL  --   --    < >  --    < > 96* 93* 98 104 102  CO2  --   --    < >  --    < > 37* 31 28 34* 34*  GLUCOSE  --   --    < >  --    < > 85 169* 103* 82 84  BUN  --   --    < >  --    < > 18 32* 30* 18 9  CREATININE  --   --    < >  --    < > 0.71 1.67* 1.24* 0.75 0.65  CALCIUM  --   --    < >  --    < > 9.8 9.3 8.8* 8.8* 8.7*  MG 1.9 1.9  --  2.0  --  2.2  --   --  2.1  --   PHOS  --  3.6  --  3.8  --  3.0  --   --  2.7  --    < > = values in this interval not displayed.   GFR: Estimated Creatinine Clearance: 51.3 mL/min (by C-G formula based on SCr of 0.65 mg/dL). Liver Function Tests: Recent Labs  Lab 12/24/24 0430 12/25/24 2055 12/27/24 0534 12/28/24 0828  AST  --  13* 12* 10*  ALT  --  12 8 7   ALKPHOS  --  182* 127* 114  BILITOT  --  0.3 0.2 0.3  PROT  --  7.7 6.2* 6.1*  ALBUMIN 3.5 3.7 3.0* 3.0*   CBG: Recent Labs  Lab 12/22/24 0752 12/23/24 0802 12/24/24 0743  GLUCAP 127* 89 111*     Recent Results (from the past 240 hours)  Resp panel by RT-PCR (RSV, Flu A&B, Covid) Anterior Nasal Swab     Status: None   Collection Time: 12/21/24  4:10 PM   Specimen: Anterior Nasal Swab  Result Value Ref Range Status   SARS Coronavirus 2 by RT PCR NEGATIVE NEGATIVE Final    Comment: (NOTE) SARS-CoV-2 target nucleic acids are NOT DETECTED.  The SARS-CoV-2 RNA is generally detectable in upper respiratory specimens during the acute phase of infection. The lowest concentration of SARS-CoV-2 viral copies this assay can detect is 138 copies/mL. A negative result does not preclude SARS-Cov-2 infection and should not be used as the sole basis for treatment or other patient management decisions. A negative result may occur with  improper specimen collection/handling, submission of specimen other than nasopharyngeal swab, presence of viral mutation(s) within the areas targeted by this assay, and inadequate number of viral copies(<138 copies/mL). A negative result must be combined with clinical observations, patient history, and epidemiological information. The expected result is Negative.  Fact Sheet for Patients:  bloggercourse.com  Fact Sheet for Healthcare Providers:  seriousbroker.it  This test is no t yet approved or cleared by the [place]  FDA and  has been authorized for detection and/or diagnosis of SARS-CoV-2 by FDA under an Emergency Use Authorization (EUA). This EUA will remain  in effect (meaning this test can be used) for the duration of the COVID-19 declaration under Section 564(b)(1) of the Act, 21 U.S.C.section 360bbb-3(b)(1), unless the authorization is terminated  or revoked sooner.       Influenza A by PCR NEGATIVE NEGATIVE Final   Influenza B by PCR NEGATIVE NEGATIVE Final    Comment: (NOTE) The Xpert Xpress  SARS-CoV-2/FLU/RSV plus assay is intended as an aid in the diagnosis of influenza from  Nasopharyngeal swab specimens and should not be used as a sole basis for treatment. Nasal washings and aspirates are unacceptable for Xpert Xpress SARS-CoV-2/FLU/RSV testing.  Fact Sheet for Patients: bloggercourse.com  Fact Sheet for Healthcare Providers: seriousbroker.it  This test is not yet approved or cleared by the [place]  FDA and has been authorized for detection and/or diagnosis of SARS-CoV-2 by FDA under an Emergency Use Authorization (EUA). This EUA will remain in effect (meaning this test can be used) for the duration of the COVID-19 declaration under Section 564(b)(1) of the Act, 21 U.S.C. section 360bbb-3(b)(1), unless the authorization is terminated or revoked.     Resp Syncytial Virus by PCR NEGATIVE NEGATIVE Final    Comment: (NOTE) Fact Sheet for Patients: bloggercourse.com  Fact Sheet for Healthcare Providers: seriousbroker.it  This test is not yet approved or cleared by the [place]  FDA and has been authorized for detection and/or diagnosis of SARS-CoV-2 by FDA under an Emergency Use Authorization (EUA). This EUA will remain in effect (meaning this test can be used) for the duration of the COVID-19 declaration under Section 564(b)(1) of the Act, 21 U.S.C. section 360bbb-3(b)(1), unless the authorization is terminated or revoked.  Performed at [facility], [address] [place], [place] [address]   Blood culture (routine x 2)     Status: None   Collection Time: 12/21/24  4:10 PM   Specimen: BLOOD  Result Value Ref Range Status   Specimen Description BLOOD BLOOD RIGHT ARM  Final   Special Requests   Final    BOTTLES DRAWN AEROBIC AND ANAEROBIC Blood Culture adequate volume   Culture   Final    NO GROWTH 5 DAYS Performed at [facility], [address] [place], [place] [address]    Report Status 12/26/2024 FINAL  Final   Blood culture (routine x 2)     Status: None   Collection Time: 12/21/24  4:10 PM   Specimen: BLOOD  Result Value Ref Range Status   Specimen Description BLOOD BLOOD RIGHT ARM  Final   Special Requests   Final    BOTTLES DRAWN AEROBIC AND ANAEROBIC Blood Culture results may not be optimal due to an inadequate volume of blood received in culture bottles   Culture   Final    NO GROWTH 5 DAYS Performed at [facility], [address], [place], [place] [address]    Report Status 12/26/2024 FINAL  Final     Radiology Studies: No results found.   Scheduled Meds:  amitriptyline   50 mg Oral QHS   aspirin   81 mg Oral Daily   budesonide -glycopyrrolate -formoterol   2 puff Inhalation BID   gabapentin   1,200 mg Oral TID   latanoprost   1 drop Both Eyes QHS   pantoprazole   40 mg Oral BID   sertraline   25 mg Oral Daily   simvastatin   40 mg Oral QPM   zolpidem   5 mg Oral QHS   Continuous Infusions:  sodium chloride  75 mL/hr at 12/28/24 1144     LOS: 0 days  MDM: Patient is high risk for one or more organ failure.  They necessitate ongoing hospitalization for continued IV therapies and subsequent lab monitoring. Total time spent interpreting labs and vitals, reviewing the medical record, coordinating care amongst consultants and care team members, directly assessing and discussing care with the patient and/or family: 55 min  Demetrias Goodbar, DO [facility]  To contact the attending physician between 7A-7P please use Epic Chat. To contact the covering physician during after hours 7P-7A, please review Amion.  12/28/2024, 12:16 PM   *This document has been created with the assistance of dictation software. Please excuse typographical errors. *   "

## 2024-12-28 NOTE — Plan of Care (Signed)

## 2024-12-28 NOTE — Plan of Care (Signed)

## 2024-12-29 DIAGNOSIS — E785 Hyperlipidemia, unspecified: Secondary | ICD-10-CM | POA: Diagnosis not present

## 2024-12-29 DIAGNOSIS — R55 Syncope and collapse: Secondary | ICD-10-CM | POA: Diagnosis not present

## 2024-12-29 DIAGNOSIS — F1129 Opioid dependence with unspecified opioid-induced disorder: Secondary | ICD-10-CM | POA: Diagnosis not present

## 2024-12-29 DIAGNOSIS — J449 Chronic obstructive pulmonary disease, unspecified: Secondary | ICD-10-CM | POA: Diagnosis not present

## 2024-12-29 MED ORDER — PSYLLIUM 95 % PO PACK
1.0000 | PACK | Freq: Three times a day (TID) | ORAL | Status: DC
  Administered 2024-12-29 – 2024-12-31 (×3): 1 via ORAL
  Filled 2024-12-29 (×8): qty 1

## 2024-12-29 NOTE — Progress Notes (Signed)
 " PROGRESS NOTE    Alexandra [name]  FMW:[id number] DOB: 05-Mar-1941 DOA: 12/25/2024 PCP: Derick Leita POUR, MD  Chief Complaint  Patient presents with   Fall   Loss of Consciousness    Hospital Course:  Alexandra [name] is an 84yoF with [name], COPD, chronic oxygen dependence on 2 to 3 L O2, depression with anxiety, anemia, right lung cancer s/p radiation, idiopathic neuropathy, opioid dependence, prior perforated colon, who presents with syncope and diarrhea. Patient was hospitalized 1/4 through 1/7 with sepsis secondary to pneumonia and COPD exacerbation.  During the hospital stay states she completed a course of Rocephin  and azithromycin .  She also had diarrhea during that time which was thought to be secondary to antibiotic use. Upon discharge home she reports diarrhea has been persistent 4-5 times a day.  Prior to arrival she had syncopal episode while walking to the bathroom.  She injured her foot in the fall and now has pain in that area. On arrival she was found to have leukocytosis of 18.6, AKI with creatinine of 1.67, BUN 32, mildly tachycardic to 106, vitals otherwise WNL.  She is on her home O2 requirement.  CXR with emphysema and stable right apical pleural-parenchymal opacity.  X-ray of the left foot does show abnormal alignment at the base of the first metatarsal concerning for Lisfranc fracture.  CT confirms multiple fractures.  Patient will need outpatient follow-up for permanent fixation with podiatry in 2 weeks.  Subjective: Patient reports still having diarrhea this morning.  Have discussed with bedside RN who has not witnessed any diarrhea. Patient is unable to quantify how frequently this is happening.  She reports she still feels too weak to discharge home.  Has not yet tried to ambulate with Cam boot at the time my evaluation  Objective: Vitals:   12/29/24 0505 12/29/24 0835 12/29/24 1221 12/29/24 1610  BP: (!) 137/59 (!) 160/74 (!) 131/57 (!) 142/61  Pulse: 87 (!) 101 87 88  Resp: 18  18 18 18   Temp: 99 F (37.2 C) 99.3 F (37.4 C) 99 F (37.2 C) 98.4 F (36.9 C)  TempSrc:    Oral  SpO2: 99% 100% 98% 100%  Weight:      Height:        Intake/Output Summary (Last 24 hours) at 12/29/2024 1740 Last data filed at 12/29/2024 1420 Gross per 24 hour  Intake 920 ml  Output --  Net 920 ml   Filed Weights   12/25/24 2012  Weight: 59.9 kg    Examination: General exam: Appears calm and comfortable, NAD  Respiratory system: No work of breathing, symmetric chest wall expansion Cardiovascular system: S1 & S2 heard, RRR.  Gastrointestinal system: Abdomen is nondistended, soft and nontender.  Neuro: Very drowsy but arousable and oriented when prompted Extremities: Left foot with dorsal bruising, tender to palpation  Assessment & Plan:  Principal Problem:   Syncope Active Problems:   Fall at home, initial encounter   Diarrhea   Chronic obstructive pulmonary disease (COPD) (HCC)   Chronic hypoxic respiratory failure (HCC)   Myocardial injury   Hyperlipidemia   Malignant neoplasm of upper lobe of right lung (HCC)   AKI (acute kidney injury)   Opioid dependence (HCC)   Left foot pain   Anxiety and depression   Syncope and fall at home, initial encounter Orthostatic hypotension - Suspected secondary to dehydration from diarrhea - Patient very dehydrated on arrival - Has been on IV fluids, will discontinue now given she is tolerating p.o.  and may be becoming volume overloaded - No focal neurodeficits - Head CT: without acute intracranial abnormality - Orthostatic vitals positive: Lying 151/66, standing 79/43 - Continue with fall precautions. - PT/OT now recommending home health.  Diarrhea - C. difficile and GI PCR ordered but still pending results - For now have added psyllium husk as bulking agent.  Hopefully this will slow diarrhea down - As needed Imodium .  Acute on chronic anemia Iron deficiency anemia - Baseline hemoglobin 8-9, has been gradually  downtrending. - No acute blood loss appreciated.  May be hemodilutional but she is nearing the need for transfusion - Epi fluids discontinued, hemoglobin rising slowly - Start on ferrous sulfate  for iron deficiency.  Iron: 13, TSAT 5.  Left foot fracture - CT scan confirms acute fractures at the level of Lisfranc joint, acute displaced fracture of the dorsal medial base of the second metatarsal, acute mildly distracted fracture of the lateral aspect of medial cuneiform, mild widening of the Lisfranc joint, Mildly distracted acute fractures of the dorsal lateral aspect of the intermediate and lateral cuneiforms, mildly distracted acute cortical avulsion fracture of the dorsal lateral aspect of the cuboid, acute displaced fracture of the base of the first metatarsal, mildly distracted fracture of the medial plantar aspect of the fourth metatarsal. - Have consulted with podiatry Dr. Lennie who will plan for outpatient follow-up in 1 to 2 weeks for surgical fixation. - For now tall cam boot and NWB orders with heel contact only for transfers. - PT/OT able to work with patient for the first time today with boot, home health will need to be ordered at DC  COPD Chronic hypoxic respiratory failure - At baseline on 2 to 3 L O2.  Presently appears to be at her respiratory baseline - No evidence of respiratory distress.  Denies shortness of breath  Elevated troponin - Troponin 28.  Downtrending on repeat.  No chest pain.  Likely demand ischemia - Continue home aspirin  and statin  Hyperlipidemia - Statin  Malignant neoplasm of right upper lung, status postradiation therapy - Continue outpatient follow-up with oncology - Post radiation findings on CT/CXR  Leukocytosis - On arrival WBC 19, stool studies pending to evaluate for new infection.  No pneumonia on CXR.  May be reactive due to dehydration on arrival - Leukocytosis improved without antibiotics - Continue to monitor CBC, remains  afebrile  AKI, resolved - Likely secondary to dehydration from diarrhea - Baseline creatinine 0.71, creatinine on arrival 1.67 - AKI improving with IV fluids. - CMP in AM.  Avoid nephrotoxic medications  Chronic pain Opioid dependence - Continue home meds  Anxiety Depression - Continue home meds   DVT prophylaxis: SCDs   Code Status: Full Code Disposition: Inpatient pending clinical resolution.  Hopefully home tomorrow with home health  Consultants:    Procedures:    Antimicrobials:  Anti-infectives (From admission, onward)    None       Data Reviewed: I have personally reviewed following labs and imaging studies CBC: Recent Labs  Lab 12/24/24 0430 12/25/24 2055 12/26/24 0324 12/27/24 0534 12/28/24 0828 12/28/24 1858  WBC 13.3* 18.6* 15.8* 12.5* 10.4  --   NEUTROABS  --  16.0*  --   --  8.0*  --   HGB 9.1* 9.7* 9.3* 7.8* 7.1* 7.7*  HCT 30.7* 32.8* 33.3* 26.5* 24.4* 26.2*  MCV 91.1 90.4 97.1 92.3 93.1  --   PLT 437* 520* 334 342 305  --    Basic Metabolic Panel: Recent Labs  Lab 12/23/24 0422 12/23/24 0424 12/24/24 0430 12/25/24 2055 12/26/24 0324 12/27/24 0534 12/28/24 0828  NA  --    < > 140 137 138 143 140  K  --    < > 4.2 3.7 4.0 4.1 4.0  CL  --    < > 96* 93* 98 104 102  CO2  --    < > 37* 31 28 34* 34*  GLUCOSE  --    < > 85 169* 103* 82 84  BUN  --    < > 18 32* 30* 18 9  CREATININE  --    < > 0.71 1.67* 1.24* 0.75 0.65  CALCIUM  --    < > 9.8 9.3 8.8* 8.8* 8.7*  MG 2.0  --  2.2  --   --  2.1  --   PHOS 3.8  --  3.0  --   --  2.7  --    < > = values in this interval not displayed.   GFR: Estimated Creatinine Clearance: 51.3 mL/min (by C-G formula based on SCr of 0.65 mg/dL). Liver Function Tests: Recent Labs  Lab 12/24/24 0430 12/25/24 2055 12/27/24 0534 12/28/24 0828  AST  --  13* 12* 10*  ALT  --  12 8 7   ALKPHOS  --  182* 127* 114  BILITOT  --  0.3 0.2 0.3  PROT  --  7.7 6.2* 6.1*  ALBUMIN 3.5 3.7 3.0* 3.0*    CBG: Recent Labs  Lab 12/23/24 0802 12/24/24 0743  GLUCAP 89 111*    Recent Results (from the past 240 hours)  Resp panel by RT-PCR (RSV, Flu A&B, Covid) Anterior Nasal Swab     Status: None   Collection Time: 12/21/24  4:10 PM   Specimen: Anterior Nasal Swab  Result Value Ref Range Status   SARS Coronavirus 2 by RT PCR NEGATIVE NEGATIVE Final    Comment: (NOTE) SARS-CoV-2 target nucleic acids are NOT DETECTED.  The SARS-CoV-2 RNA is generally detectable in upper respiratory specimens during the acute phase of infection. The lowest concentration of SARS-CoV-2 viral copies this assay can detect is 138 copies/mL. A negative result does not preclude SARS-Cov-2 infection and should not be used as the sole basis for treatment or other patient management decisions. A negative result may occur with  improper specimen collection/handling, submission of specimen other than nasopharyngeal swab, presence of viral mutation(s) within the areas targeted by this assay, and inadequate number of viral copies(<138 copies/mL). A negative result must be combined with clinical observations, patient history, and epidemiological information. The expected result is Negative.  Fact Sheet for Patients:  bloggercourse.com  Fact Sheet for Healthcare Providers:  seriousbroker.it  This test is no t yet approved or cleared by the [place]  FDA and  has been authorized for detection and/or diagnosis of SARS-CoV-2 by FDA under an Emergency Use Authorization (EUA). This EUA will remain  in effect (meaning this test can be used) for the duration of the COVID-19 declaration under Section 564(b)(1) of the Act, 21 U.S.C.section 360bbb-3(b)(1), unless the authorization is terminated  or revoked sooner.       Influenza A by PCR NEGATIVE NEGATIVE Final   Influenza B by PCR NEGATIVE NEGATIVE Final    Comment: (NOTE) The Xpert Xpress SARS-CoV-2/FLU/RSV  plus assay is intended as an aid in the diagnosis of influenza from Nasopharyngeal swab specimens and should not be used as a sole basis for treatment. Nasal washings and aspirates are unacceptable for Xpert  Xpress SARS-CoV-2/FLU/RSV testing.  Fact Sheet for Patients: bloggercourse.com  Fact Sheet for Healthcare Providers: seriousbroker.it  This test is not yet approved or cleared by the [place]  FDA and has been authorized for detection and/or diagnosis of SARS-CoV-2 by FDA under an Emergency Use Authorization (EUA). This EUA will remain in effect (meaning this test can be used) for the duration of the COVID-19 declaration under Section 564(b)(1) of the Act, 21 U.S.C. section 360bbb-3(b)(1), unless the authorization is terminated or revoked.     Resp Syncytial Virus by PCR NEGATIVE NEGATIVE Final    Comment: (NOTE) Fact Sheet for Patients: bloggercourse.com  Fact Sheet for Healthcare Providers: seriousbroker.it  This test is not yet approved or cleared by the [place]  FDA and has been authorized for detection and/or diagnosis of SARS-CoV-2 by FDA under an Emergency Use Authorization (EUA). This EUA will remain in effect (meaning this test can be used) for the duration of the COVID-19 declaration under Section 564(b)(1) of the Act, 21 U.S.C. section 360bbb-3(b)(1), unless the authorization is terminated or revoked.  Performed at [facility], [address] [place], [place] [address]   Blood culture (routine x 2)     Status: None   Collection Time: 12/21/24  4:10 PM   Specimen: BLOOD  Result Value Ref Range Status   Specimen Description BLOOD BLOOD RIGHT ARM  Final   Special Requests   Final    BOTTLES DRAWN AEROBIC AND ANAEROBIC Blood Culture adequate volume   Culture   Final    NO GROWTH 5 DAYS Performed at [facility], [address] [place], [place] [address]    Report Status 12/26/2024 FINAL  Final  Blood culture (routine x 2)     Status: None   Collection Time: 12/21/24  4:10 PM   Specimen: BLOOD  Result Value Ref Range Status   Specimen Description BLOOD BLOOD RIGHT ARM  Final   Special Requests   Final    BOTTLES DRAWN AEROBIC AND ANAEROBIC Blood Culture results may not be optimal due to an inadequate volume of blood received in culture bottles   Culture   Final    NO GROWTH 5 DAYS Performed at [facility], [address] [place], [place] [address]    Report Status 12/26/2024 FINAL  Final     Radiology Studies: No results found.   Scheduled Meds:  amitriptyline   50 mg Oral QHS   aspirin   81 mg Oral Daily   budesonide -glycopyrrolate -formoterol   2 puff Inhalation BID   ferrous sulfate   325 mg Oral Q breakfast   gabapentin   1,200 mg Oral TID   latanoprost   1 drop Both Eyes QHS   pantoprazole   40 mg Oral BID   psyllium  1 packet Oral TID   sertraline   25 mg Oral Daily   simvastatin   40 mg Oral QPM   zolpidem   5 mg Oral QHS   Continuous Infusions:     LOS: 1 day  MDM: Patient is high risk for one or more organ failure.  They necessitate ongoing hospitalization for continued IV therapies and subsequent lab monitoring. Total time spent interpreting labs and vitals, reviewing the medical record, coordinating care amongst consultants and care team members, directly assessing and discussing care with the patient and/or family: 55 min  Melody Cirrincione, DO [facility]  To contact the attending physician between 7A-7P please use Epic Chat. To contact the covering physician during after hours 7P-7A, please review Amion.  12/29/2024, 5:40 PM   *  This document has been created with the assistance of dictation software. Please excuse typographical errors. *   "

## 2024-12-29 NOTE — Progress Notes (Signed)
 Physical Therapy Treatment Patient Details Name: Alexandra [name] MRN: [id number] DOB: May 01, 1941 Today's Date: 12/29/2024   History of Present Illness Pt admitted to [facility] on 12/25/24 under observation for c/o syncope and falls. Imaging significant for:  possible Lisfranc injury and possible fracture at the base of the 2nd metatarsal bone. Elevated troponin due to demand ischemia. Recent hospital admission (1/4-1/7) for sepsis secondary to PNA and COPD exacerbation. Significant PMH includes: [name], COPD on 2-3 L O2, depression with anxiety, anemia, right lung cancer (s/p of radiation therapy), idiopathic neuropathy, opioid dependence, perforated colon.    PT Comments  Pt needing to use bathroom and requests to use BSC.  Noted CAM boot has not arrived yet and asked secretary to call [employer] for boot.  She is able to stand pivot to [facility] to void, stand for care then transfer to recliner.  She is able to maintain NWB with only light heel contact for balance as needed.  Educated at length about CAM boot (when it arrives) and it's use.  Pt is NWB with heel contact only for transfers.  Pt voices understanding. Also reviewed HEP.    Discussed at length discharge plan.  Pt lives at home with her husband who she cares for with assist of family.  She would like to return home vs SNF. She has all equipment including a wheelchair at home which she is comfortable using.  She has home O2 that she manages as well.  Will update recommendations as home is reasonable once CAM boot has arrived and medically cleared by MD.   If plan is discharge home, recommend the following: Assistance with cooking/housework;Assist for transportation;A little help with bathing/dressing/bathroom   Can travel by private vehicle        Equipment Recommendations       Recommendations for Other Services       Precautions / Restrictions Precautions Precautions: Fall Restrictions Weight Bearing Restrictions Per Provider Order: Yes LLE  Weight Bearing Per Provider Order: Non weight bearing Other Position/Activity Restrictions: NWB to LLE until podiatry consult     Mobility  Bed Mobility Overal bed mobility: Modified Independent       Supine to sit: Modified independent (Device/Increase time)       Patient Response: Cooperative  Transfers Overall transfer level: Modified independent Equipment used: None Transfers: Bed to chair/wheelchair/BSC Sit to Stand: Supervision           General transfer comment: steady stnd pivot to chair with arms of BSC for support.    Ambulation/Gait         Gait velocity: decreased     General Gait Details: deferred as CAM boot has not arrived at this time. does well with NWB/light Heel WB for transfers with extra care to [facility] then to recliner per pt request.   Stairs             Wheelchair Mobility     Tilt Bed Tilt Bed Patient Response: Cooperative  Modified Rankin (Stroke Patients Only)       Balance Overall balance assessment: Needs assistance Sitting-balance support: Feet supported Sitting balance-Leahy Scale: Normal     Standing balance support: Single extremity supported Standing balance-Leahy Scale: Good Standing balance comment: able to transfer and do standing pericare with ease                            Communication Communication Communication: No apparent difficulties  Cognition Arousal: Alert Behavior During Therapy:  WFL for tasks assessed/performed   PT - Cognitive impairments: No apparent impairments                         Following commands: Intact      Cueing Cueing Techniques: Verbal cues  Exercises Other Exercises Other Exercises: discussed at length HEP and mobility/safety CAM boot use    General Comments        Pertinent Vitals/Pain Pain Assessment Pain Assessment: Faces Faces Pain Scale: Hurts a little bit Pain Location: BLEs Pain Descriptors / Indicators: Sore Pain Intervention(s):  Limited activity within patient's tolerance, Monitored during session    Home Living                          Prior Function            PT Goals (current goals can now be found in the care plan section) Progress towards PT goals: Progressing toward goals    Frequency    Min 2X/week      PT Plan      Co-evaluation              AM-PAC PT 6 Clicks Mobility   Outcome Measure  Help needed turning from your back to your side while in a flat bed without using bedrails?: None Help needed moving from lying on your back to sitting on the side of a flat bed without using bedrails?: None Help needed moving to and from a bed to a chair (including a wheelchair)?: None Help needed standing up from a chair using your arms (e.g., wheelchair or bedside chair)?: None Help needed to walk in hospital room?: A Little Help needed climbing 3-5 steps with a railing? : A Lot 6 Click Score: 21    End of Session Equipment Utilized During Treatment: Oxygen Activity Tolerance: Patient tolerated treatment well Patient left: in chair;with call bell/phone within reach;with chair alarm set Nurse Communication: Mobility status;Other (comment) (need for CAM boot to be oredered) PT Visit Diagnosis: Difficulty in walking, not elsewhere classified (R26.2);Muscle weakness (generalized) (M62.81);Unsteadiness on feet (R26.81)     Time: 8942-8879 PT Time Calculation (min) (ACUTE ONLY): 23 min  Charges:    $Therapeutic Activity: 23-37 mins PT General Charges $$ ACUTE PT VISIT: 1 Visit                   Lauraine Gills, PTA 12/29/2024, 11:30 AM

## 2024-12-29 NOTE — Progress Notes (Signed)
 Patient has remained free from any noted signs of acute distress.  Has required prn pain medication to assist with adequate pain management during current night shift.  Patient remains free from any additional signs of associated symptoms.  Patient to continue to be monitored by hospital staff.

## 2024-12-29 NOTE — Progress Notes (Signed)
 Physical Therapy Treatment Patient Details Name: Alexandra [name] MRN: [id number] DOB: 1941/04/24 Today's Date: 12/29/2024   History of Present Illness Pt admitted to [facility] on 12/25/24 under observation for c/o syncope and falls. Imaging significant for:  possible Lisfranc injury and possible fracture at the base of the 2nd metatarsal bone. Elevated troponin due to demand ischemia. Recent hospital admission (1/4-1/7) for sepsis secondary to PNA and COPD exacerbation. Significant PMH includes: [name], COPD on 2-3 L O2, depression with anxiety, anemia, right lung cancer (s/p of radiation therapy), idiopathic neuropathy, opioid dependence, perforated colon.    PT Comments  CAM boot arrived this pm.  Education for donning CAM boot and she is able to stnd pivot transfer to [facility] to void then back to bed. She does struggle some with weight and bulk of boot and cga is provided. She is given time to try taking boot off and does struggle some but is able to do so with light cues.    She seems a bit overwhelmed this session vs this am.  Reviewed need to wear boot for mobility per orders.  Stated husband is at home on hospice care and family is providing care while she is here.  She had declined rehab this am opting to go home to be with him and family.  She would benefit from +1 assist for transfers until she is more comfortable with CAM boot.  She does voice some concern over asking family to help her but also stated if they are home with husband, they could help her too.  She has all equipment needed for home.  Will leave discharge recommendations for HHPT.  Pt is encouraged to have open and honest conversation with family about her support needs.     If plan is discharge home, recommend the following: Assistance with cooking/housework;Assist for transportation;A little help with bathing/dressing/bathroom   Can travel by private vehicle        Equipment Recommendations       Recommendations for Other Services        Precautions / Restrictions Precautions Precautions: Fall Restrictions Weight Bearing Restrictions Per Provider Order: Yes LLE Weight Bearing Per Provider Order: Non weight bearing Other Position/Activity Restrictions: NWB to LLE until podiatry consult     Mobility  Bed Mobility Overal bed mobility: Modified Independent       Supine to sit: Modified independent (Device/Increase time)       Patient Response: Cooperative  Transfers Overall transfer level: Needs assistance Equipment used: None Transfers: Bed to chair/wheelchair/BSC Sit to Stand: Contact guard assist, Supervision           General transfer comment: more cumbersome with CAM boot but still does well    Ambulation/Gait         Gait velocity: decreased     General Gait Details: deferred as CAM boot has not arrived at this time. does well with NWB/light Heel WB for transfers with extra care to [facility] then to recliner per pt request.   Stairs             Wheelchair Mobility     Tilt Bed Tilt Bed Patient Response: Cooperative  Modified Rankin (Stroke Patients Only)       Balance Overall balance assessment: Needs assistance Sitting-balance support: Feet supported Sitting balance-Leahy Scale: Normal     Standing balance support: Single extremity supported Standing balance-Leahy Scale: Good Standing balance comment: able to transfer and do standing pericare with ease  Communication Communication Communication: No apparent difficulties  Cognition Arousal: Alert Behavior During Therapy: WFL for tasks assessed/performed   PT - Cognitive impairments: No apparent impairments                       PT - Cognition Comments: does seem a bit overwhelmed this pm Following commands: Intact      Cueing Cueing Techniques: Verbal cues  Exercises Other Exercises Other Exercises: discussed at length HEP and mobility/safety CAM boot use     General Comments        Pertinent Vitals/Pain Pain Assessment Pain Assessment: Faces Faces Pain Scale: Hurts a little bit Pain Location: BLEs Pain Descriptors / Indicators: Sore Pain Intervention(s): Monitored during session, Repositioned    Home Living                          Prior Function            PT Goals (current goals can now be found in the care plan section) Progress towards PT goals: Progressing toward goals    Frequency    Min 2X/week      PT Plan      Co-evaluation              AM-PAC PT 6 Clicks Mobility   Outcome Measure  Help needed turning from your back to your side while in a flat bed without using bedrails?: None Help needed moving from lying on your back to sitting on the side of a flat bed without using bedrails?: None Help needed moving to and from a bed to a chair (including a wheelchair)?: A Little Help needed standing up from a chair using your arms (e.g., wheelchair or bedside chair)?: A Little Help needed to walk in hospital room?: A Little Help needed climbing 3-5 steps with a railing? : A Lot 6 Click Score: 19    End of Session Equipment Utilized During Treatment: Oxygen Activity Tolerance: Patient tolerated treatment well Patient left: with call bell/phone within reach;in bed;with bed alarm set Nurse Communication: Mobility status;Other (comment) (need for CAM boot to be oredered) PT Visit Diagnosis: Difficulty in walking, not elsewhere classified (R26.2);Muscle weakness (generalized) (M62.81);Unsteadiness on feet (R26.81)     Time: 1355-1415 PT Time Calculation (min) (ACUTE ONLY): 20 min  Charges:    $Therapeutic Activity: 8-22 mins PT General Charges $$ ACUTE PT VISIT: 1 Visit                   Lauraine Gills, PTA 12/29/2024, 2:23 PM

## 2024-12-29 NOTE — Plan of Care (Signed)

## 2024-12-30 DIAGNOSIS — J449 Chronic obstructive pulmonary disease, unspecified: Secondary | ICD-10-CM | POA: Diagnosis not present

## 2024-12-30 DIAGNOSIS — E785 Hyperlipidemia, unspecified: Secondary | ICD-10-CM | POA: Diagnosis not present

## 2024-12-30 DIAGNOSIS — R55 Syncope and collapse: Secondary | ICD-10-CM | POA: Diagnosis not present

## 2024-12-30 DIAGNOSIS — F1129 Opioid dependence with unspecified opioid-induced disorder: Secondary | ICD-10-CM | POA: Diagnosis not present

## 2024-12-30 LAB — COMPREHENSIVE METABOLIC PANEL WITH GFR
ALT: 7 U/L (ref 0–44)
AST: 13 U/L — ABNORMAL LOW (ref 15–41)
Albumin: 3.2 g/dL — ABNORMAL LOW (ref 3.5–5.0)
Alkaline Phosphatase: 123 U/L (ref 38–126)
Anion gap: 5 (ref 5–15)
BUN: 9 mg/dL (ref 8–23)
CO2: 37 mmol/L — ABNORMAL HIGH (ref 22–32)
Calcium: 9.3 mg/dL (ref 8.9–10.3)
Chloride: 97 mmol/L — ABNORMAL LOW (ref 98–111)
Creatinine, Ser: 0.66 mg/dL (ref 0.44–1.00)
GFR, Estimated: >60 mL/min
Glucose, Bld: 93 mg/dL (ref 70–99)
Potassium: 4.2 mmol/L (ref 3.5–5.1)
Sodium: 139 mmol/L (ref 135–145)
Total Bilirubin: 0.3 mg/dL (ref 0.0–1.2)
Total Protein: 6.8 g/dL (ref 6.5–8.1)

## 2024-12-30 LAB — CBC WITH DIFFERENTIAL/PLATELET
Abs Immature Granulocytes: 0.07 K/uL (ref 0.00–0.07)
Basophils Absolute: 0 K/uL (ref 0.0–0.1)
Basophils Relative: 0 %
Eosinophils Absolute: 0.3 K/uL (ref 0.0–0.5)
Eosinophils Relative: 3 %
HCT: 28.6 % — ABNORMAL LOW (ref 36.0–46.0)
Hemoglobin: 8.5 g/dL — ABNORMAL LOW (ref 12.0–15.0)
Immature Granulocytes: 1 %
Lymphocytes Relative: 7 %
Lymphs Abs: 0.7 K/uL (ref 0.7–4.0)
MCH: 27.4 pg (ref 26.0–34.0)
MCHC: 29.7 g/dL — ABNORMAL LOW (ref 30.0–36.0)
MCV: 92.3 fL (ref 80.0–100.0)
Monocytes Absolute: 0.9 K/uL (ref 0.1–1.0)
Monocytes Relative: 10 %
Neutro Abs: 7.7 K/uL (ref 1.7–7.7)
Neutrophils Relative %: 79 %
Platelets: 420 K/uL — ABNORMAL HIGH (ref 150–400)
RBC: 3.1 MIL/uL — ABNORMAL LOW (ref 3.87–5.11)
RDW: 14.3 % (ref 11.5–15.5)
WBC: 9.7 K/uL (ref 4.0–10.5)
nRBC: 0 % (ref 0.0–0.2)

## 2024-12-30 NOTE — Progress Notes (Signed)
 Physical Therapy Treatment Patient Details Name: Alexandra [name] MRN: [id number] DOB: Dec 29, 1940 Today's Date: 12/30/2024   History of Present Illness Pt admitted to [facility] on 12/25/24 under observation for c/o syncope and falls. Imaging significant for:  possible Lisfranc injury and possible fracture at the base of the 2nd metatarsal bone. Elevated troponin due to demand ischemia. Recent hospital admission (1/4-1/7) for sepsis secondary to PNA and COPD exacerbation. Significant PMH includes: [name], COPD on 2-3 L O2, depression with anxiety, anemia, right lung cancer (s/p of radiation therapy), idiopathic neuropathy, opioid dependence, perforated colon.    PT Comments  Pt with CAM boot on in bed upon arrival.    She is independent with bed mobility to EOB with ease.  Multiple transfers are practiced with and without RW.  She is able to transfer with cga/supervision without RW and cga/light min a for upright balance with RW.  She stated she would likely continue to transfer in and out of chair without RW for general ease.  She does well maitaining TTWB with CAM boot and manages better than yesterdays session.  She confirmed she has all equipment at home including a wheelchair which she is comfortable with using.    Long discussion with pt about home situation.  Stated son is home 24 hrs a day because he does not drive.  Stated he cannot read/write but is able to physically help and has not physical impairments.  Can use a phone confidently.  Discussed with team as it remains reasonable for her to return home wheelchair level with supervision/cga for stand/squat pivot transfers.  She will need help donning and doffing her CAM boot as she struggles with straps and extended forward lean which decreases O2 and increased HR as seen in OT session this am.  Team has contacted daughter who agrees with discharge home.  Pt will benefit from any HH supports she is qualified for including home OT and PT.  She will be NWB for an  estimated 6 weeks but will be determined by MD's as she progresses.  Pt is aware and in agreement.   If plan is discharge home, recommend the following: Assistance with cooking/housework;Assist for transportation;A little help with bathing/dressing/bathroom   Can travel by private vehicle        Equipment Recommendations       Recommendations for Other Services       Precautions / Restrictions Precautions Precautions: Fall Restrictions Weight Bearing Restrictions Per Provider Order: Yes LLE Weight Bearing Per Provider Order: Non weight bearing Other Position/Activity Restrictions: LLE NWB. Per podiatry, May use the heel for contact for transfers but should not be taking full steps through the foot.     Mobility  Bed Mobility Overal bed mobility: Needs Assistance Bed Mobility: Supine to Sit             Patient Response: Cooperative  Transfers Overall transfer level: Needs assistance Equipment used: Rolling walker (2 wheels), None Transfers: Sit to/from Stand, Bed to chair/wheelchair/BSC Sit to Stand: Contact guard assist Stand pivot transfers: Supervision, Contact guard assist              Ambulation/Gait Ambulation/Gait assistance: Contact guard assist Gait Distance (Feet): 2 Feet Assistive device: Rolling walker (2 wheels) Gait Pattern/deviations: Step-through pattern, Decreased step length - right, Decreased step length - left Gait velocity: decreased     General Gait Details: more of a stnd pivot with RW support than true gait for transfer to/from recliner.   Stairs  Wheelchair Mobility     Tilt Bed Tilt Bed Patient Response: Cooperative  Modified Rankin (Stroke Patients Only)       Balance Overall balance assessment: Needs assistance Sitting-balance support: Feet supported Sitting balance-Leahy Scale: Good     Standing balance support: During functional activity, Reliant on assistive device for balance, Bilateral  upper extremity supported Standing balance-Leahy Scale: Fair                              Hotel Manager: No apparent difficulties  Cognition Arousal: Alert Behavior During Therapy: WFL for tasks assessed/performed                           PT - Cognition Comments: less anxiety this session Following commands: Intact      Cueing Cueing Techniques: Verbal cues  Exercises      General Comments General comments (skin integrity, edema, etc.): Pt desats to mid 80s-mid 70s with each attempt to don CAM boot, w/ HR increasing into 130s. Required frequent rest breaks.      Pertinent Vitals/Pain Pain Assessment Pain Assessment: No/denies pain    Home Living                          Prior Function            PT Goals (current goals can now be found in the care plan section) Progress towards PT goals: Progressing toward goals    Frequency    Min 2X/week      PT Plan      Co-evaluation              AM-PAC PT 6 Clicks Mobility   Outcome Measure  Help needed turning from your back to your side while in a flat bed without using bedrails?: None Help needed moving from lying on your back to sitting on the side of a flat bed without using bedrails?: None Help needed moving to and from a bed to a chair (including a wheelchair)?: A Little Help needed standing up from a chair using your arms (e.g., wheelchair or bedside chair)?: A Little Help needed to walk in hospital room?: A Little Help needed climbing 3-5 steps with a railing? : A Lot 6 Click Score: 19    End of Session Equipment Utilized During Treatment: Oxygen Activity Tolerance: Patient tolerated treatment well Patient left: with call bell/phone within reach;in chair;with chair alarm set Nurse Communication: Mobility status;Other (comment) PT Visit Diagnosis: Difficulty in walking, not elsewhere classified (R26.2);Muscle weakness (generalized)  (M62.81);Unsteadiness on feet (R26.81)     Time: 1131-1200 PT Time Calculation (min) (ACUTE ONLY): 29 min  Charges:    $Therapeutic Exercise: 23-37 mins PT General Charges $$ ACUTE PT VISIT: 1 Visit                   Lauraine Gills, PTA 12/30/2024, 2:01 PM

## 2024-12-30 NOTE — TOC CM/SW Note (Signed)
 Patient has mobility impairment for daily activities. A rolling walker will resolve this and the patient is safe to use it

## 2024-12-30 NOTE — Care Management Important Message (Signed)
 Important Message  Patient Details  Name: Alexandra [name] MRN: [id number] Date of Birth: Sep 27, 1941   Important Message Given:  Yes - Medicare IM     Hedda Crumbley W, CMA 12/30/2024, 11:38 AM

## 2024-12-30 NOTE — Plan of Care (Signed)
   Problem: Education: Goal: Knowledge of General Education information will improve Description Including pain rating scale, medication(s)/side effects and non-pharmacologic comfort measures Outcome: Progressing

## 2024-12-30 NOTE — Progress Notes (Signed)
 Occupational Therapy Treatment Patient Details Name: CONLEY PAWLING MRN: [id number] DOB: 25-Dec-1940 Today's Date: 12/30/2024   History of present illness Pt admitted to [facility] on 12/25/24 under observation for c/o syncope and falls. Imaging significant for:  possible Lisfranc injury and possible fracture at the base of the 2nd metatarsal bone. Elevated troponin due to demand ischemia. Recent hospital admission (1/4-1/7) for sepsis secondary to PNA and COPD exacerbation. Significant PMH includes: [name], COPD on 2-3 L O2, depression with anxiety, anemia, right lung cancer (s/p of radiation therapy), idiopathic neuropathy, opioid dependence, perforated colon.   OT comments  Ms. Ethridge struggled throughout today's OT session. Provided educ re: importance of maintaining NWB precautions, wearing CAM boot whenever OOB. Asked pt to don her boot. Boot has 5 velcro straps. Pt required a rest break of 3-5 minutes after attaching each of these straps. Whenever a problem occurred (e.g., velcro plastic loop became detached, velcro became stuck on back of boot), pt was unable to problem solve and correct. Pt found donning the boot to be very fatiguing, and HR sometimes increased to 130s+ during the donning process, while SpO2 dropped into mid-low 80s regularly and into mid 70s on 1 occasion. Pt prompted to take rest breaks and PLB throughout. Given this, anticipate that pt would be unable to don/doff CAM boot INDly up return home. Discussed whether pt's son would be able to assist in the process, with pt stating, maybe. Discussed whether pt could keep the boot on at all times, to eliminate need for donning and doffing, but pt is unsure she would be able to sleep while wearing boot. Pt also required Mod A for transferring EOB to [facility] while maintaining NWB; anticipate that pt will continue to need assistance for this and will need to use WC for ambulation. Given pt's cardiopulmonary status, anticipate that she will not be able to  propel the [facility] herself. Pt's son will need to assist pt in transfers and mobility while also attending to his father's needs. Given these challenges, raised possibility of DCing to SNF, but pt states it is imperative that she return home, given her family's needs.      If plan is discharge home, recommend the following:  Assistance with cooking/housework;A lot of help with walking and/or transfers;A lot of help with bathing/dressing/bathroom;Assist for transportation;Help with stairs or ramp for entrance   Equipment Recommendations       Recommendations for Other Services      Precautions / Restrictions Precautions Precautions: Fall Restrictions Weight Bearing Restrictions Per Provider Order: Yes LLE Weight Bearing Per Provider Order: Non weight bearing Other Position/Activity Restrictions: LLE NWB. Per podiatry, May use the heel for contact for transfers but should not be taking full steps through the foot.       Mobility Bed Mobility Overal bed mobility: Needs Assistance Bed Mobility: Supine to Sit, Sit to Supine     Supine to sit: Supervision Sit to supine: Supervision   General bed mobility comments: increased time and effort to maneuver supine<>sit with cam boot    Transfers Overall transfer level: Needs assistance Equipment used: Rolling walker (2 wheels) Transfers: Sit to/from Stand, Bed to chair/wheelchair/BSC Sit to Stand: Mod assist           General transfer comment: required Mod A for transfers after donning boot and being significantly fatigued     Balance Overall balance assessment: Needs assistance Sitting-balance support: Feet supported Sitting balance-Leahy Scale: Good     Standing balance support: During functional activity,  Reliant on assistive device for balance, Bilateral upper extremity supported Standing balance-Leahy Scale: Poor                             ADL either performed or assessed with clinical judgement   ADL  Overall ADL's : Needs assistance/impaired                     Lower Body Dressing: Moderate assistance Lower Body Dressing Details (indicate cue type and reason): donning cam boot, w/ frequent rest breaks required, 2/2 to fatigue, drop in O2, increase in HR Toilet Transfer: Moderate assistance Toilet Transfer Details (indicate cue type and reason): Mod A to transfer to [facility] while maintaining NWB to L foot                Extremity/Trunk Assessment Upper Extremity Assessment Upper Extremity Assessment: Generalized weakness   Lower Extremity Assessment Lower Extremity Assessment: Generalized weakness        Vision       Perception     Praxis     Communication Communication Communication: No apparent difficulties   Cognition Arousal: Alert Behavior During Therapy: WFL for tasks assessed/performed                                 Following commands: Intact        Cueing      Exercises Other Exercises Other Exercises: Discussed at length NWB status, CAM boot use, DC options    Shoulder Instructions       General Comments Pt desats to mid 80s-mid 70s with each attempt to don CAM boot, w/ HR increasing into 130s. Required frequent extended rest breaks.    Pertinent Vitals/ Pain       Pain Assessment Pain Assessment: 0-10 Pain Score: 3  Pain Location: L foot Pain Descriptors / Indicators: Aching Pain Intervention(s): Repositioned, Monitored during session  Home Living                                          Prior Functioning/Environment              Frequency  Min 2X/week        Progress Toward Goals  OT Goals(current goals can now be found in the care plan section)        Plan      Co-evaluation                 AM-PAC OT 6 Clicks Daily Activity     Outcome Measure   Help from another person eating meals?: None Help from another person taking care of personal grooming?: A Little Help  from another person toileting, which includes using toliet, bedpan, or urinal?: A Lot Help from another person bathing (including washing, rinsing, drying)?: A Lot Help from another person to put on and taking off regular upper body clothing?: A Little Help from another person to put on and taking off regular lower body clothing?: A Lot 6 Click Score: 16    End of Session Equipment Utilized During Treatment: Rolling walker (2 wheels);Oxygen  OT Visit Diagnosis: Muscle weakness (generalized) (M62.81);History of falling (Z91.81);Unsteadiness on feet (R26.81)   Activity Tolerance Other (comment) (limited by cardiopulmonary status)   Patient Left in bed;with  nursing/sitter in room   Nurse Communication Mobility status        Time: 9099-9052 OT Time Calculation (min): 47 min  Charges: OT General Charges $OT Visit: 1 Visit OT Treatments $Self Care/Home Management : 38-52 mins  Suzen Hock, PhD, MS, OTR/L 12/30/2024, 10:14 AM

## 2024-12-30 NOTE — Plan of Care (Signed)
   Problem: Education: Goal: Knowledge of General Education information will improve Description: Including pain rating scale, medication(s)/side effects and non-pharmacologic comfort measures Outcome: Progressing   Problem: Activity: Goal: Risk for activity intolerance will decrease Outcome: Progressing   Problem: Coping: Goal: Level of anxiety will decrease Outcome: Progressing

## 2024-12-30 NOTE — TOC Progression Note (Signed)
 Transition of Care [facility]) - Progression Note    Patient Details  Name: Alexandra [name] MRN: [id number] Date of Birth: 1941/11/11  [facility] CM/SW Contact  Daved JONETTA Hamilton, RN Phone Number: 12/30/2024, 4:09 PM  Clinical Narrative:     This CM notified patient needs RW. Order placed by Attending and necessity note has been signed. Contacted Mitch with [employer], advised anticipate patient discharge tomorrow and requested RW be delivered to patient room.   Expected Discharge Plan: Home w Home Health Services Barriers to Discharge: Continued Medical Work up               Expected Discharge Plan and Services   Discharge Planning Services: CM Consult Post Acute Care Choice: Home Health Living arrangements for the past 2 months: Single Family Home                             HH Agency: [facility] Date HH Agency Contacted: 12/26/24 Time HH Agency Contacted: [phone]     Social Drivers of Health (SDOH) Interventions SDOH Screenings   Food Insecurity: No Food Insecurity (12/26/2024)  Housing: Low Risk (12/26/2024)  Transportation Needs: No Transportation Needs (12/26/2024)  Utilities: Not At Risk (12/26/2024)  Alcohol Screen: Low Risk (03/27/2023)  Depression (PHQ2-9): Low Risk (07/17/2024)  Financial Resource Strain: Low Risk  (02/19/2024)   Received from [facility]  Physical Activity: Inactive (03/27/2023)  Social Connections: Moderately Integrated (12/26/2024)  Recent Concern: Social Connections - Socially Isolated (12/22/2024)  Stress: No Stress Concern Present (03/27/2023)  Tobacco Use: Medium Risk (12/26/2024)    Readmission Risk Interventions     No data to display

## 2024-12-30 NOTE — Progress Notes (Signed)
 " PROGRESS NOTE    Alexandra [name]  FMW:[id number] DOB: June 27, 1941 DOA: 12/25/2024 PCP: Derick Leita POUR, MD  Chief Complaint  Patient presents with   Fall   Loss of Consciousness    Hospital Course:  Alexandra [name] is an 84yoF with [name], COPD, chronic oxygen dependence on 2 to 3 L O2, depression with anxiety, anemia, right lung cancer s/p radiation, idiopathic neuropathy, opioid dependence, prior perforated colon, who presents with syncope and diarrhea. Patient was hospitalized 1/4 through 1/7 with sepsis secondary to pneumonia and COPD exacerbation.  During the hospital stay states she completed a course of Rocephin  and azithromycin .  She also had diarrhea during that time which was thought to be secondary to antibiotic use. Upon discharge home she reports diarrhea has been persistent 4-5 times a day.  Prior to arrival she had syncopal episode while walking to the bathroom.  She injured her foot in the fall and now has pain in that area. On arrival she was found to have leukocytosis of 18.6, AKI with creatinine of 1.67, BUN 32, mildly tachycardic to 106, vitals otherwise WNL.  She is on her home O2 requirement.  CXR with emphysema and stable right apical pleural-parenchymal opacity.  X-ray of the left foot does show abnormal alignment at the base of the first metatarsal concerning for Lisfranc fracture.  CT confirms multiple fractures.   Stay was then delayed pending discharge planning.  Ultimately she is going to discharge home tomorrow with home health services.  Family is arranging additional assistance in the home  Subjective: No acute events overnight.  Patient endorses still feeling very weak.  We had extensive discussion and ready regarding discharge plans.  She reports she is not sure if her family is going to be able to take care of her at home.  I then had extensive discussion with her daughter Alexandra [name].  Alexandra [name] reports the patient can come home but needs an additional 24 hours for additional  arrangements.  They are working on hiring assistance to be at home with the patient 24/7  Objective: Vitals:   12/29/24 1958 12/30/24 0003 12/30/24 0504 12/30/24 0826  BP: (!) 147/59 (!) 142/64 (!) 144/80 (!) 143/71  Pulse: 83 88 82 97  Resp: 19 18 16 18   Temp: 98.2 F (36.8 C) 98.2 F (36.8 C) 98.5 F (36.9 C)   TempSrc: Oral Oral Oral   SpO2: 100% 97% 99% 99%  Weight:      Height:        Intake/Output Summary (Last 24 hours) at 12/30/2024 1220 Last data filed at 12/29/2024 1900 Gross per 24 hour  Intake 480 ml  Output --  Net 480 ml   Filed Weights   12/25/24 2012  Weight: 59.9 kg    Examination: General exam: Appears calm and comfortable, NAD  Respiratory system: No work of breathing, symmetric chest wall expansion Cardiovascular system: S1 & S2 heard, RRR.  Gastrointestinal system: Abdomen is nondistended, soft and nontender.  Neuro: Alert, oriented.  Does require some prompting. Extremities: Left foot with dorsal bruising, tender to palpation  Assessment & Plan:  Principal Problem:   Syncope Active Problems:   Fall at home, initial encounter   Diarrhea   Chronic obstructive pulmonary disease (COPD) (HCC)   Chronic hypoxic respiratory failure (HCC)   Myocardial injury   Hyperlipidemia   Malignant neoplasm of upper lobe of right lung (HCC)   AKI (acute kidney injury)   Opioid dependence (HCC)   Left foot pain  Anxiety and depression   Syncope and fall at home, initial encounter Orthostatic hypotension - Suspected secondary to dehydration from diarrhea - Was very dehydrated on arrival. - Dehydration resolved with IV fluids.  Tolerating p.o. now - Head CT: without acute intracranial abnormality - Orthostatic vitals positive: Lying 151/66, standing 79/43 - Continue with fall precautions. - PT/OT now recommending home health.  Ordered and pending arrangement  Diarrhea - This began on prior admission thought to be secondary to antibiotics - C. difficile  and GI PCR ordered but still pending results - Added psyllium husk as a bulking agent with significant success.  Diarrhea has resolved. - As needed Imodium .  Acute on chronic anemia Iron deficiency anemia - Baseline hemoglobin 8-9, has been gradually downtrending. - No acute blood loss appreciated.  Likely hemodilutional.  IV fluids stopped and hemoglobin rising - Start on ferrous sulfate  for iron deficiency.  Iron: 13, TSAT 5.  Left foot fracture - CT scan confirms acute fractures at the level of Lisfranc joint, acute displaced fracture of the dorsal medial base of the second metatarsal, acute mildly distracted fracture of the lateral aspect of medial cuneiform, mild widening of the Lisfranc joint, Mildly distracted acute fractures of the dorsal lateral aspect of the intermediate and lateral cuneiforms, mildly distracted acute cortical avulsion fracture of the dorsal lateral aspect of the cuboid, acute displaced fracture of the base of the first metatarsal, mildly distracted fracture of the medial plantar aspect of the fourth metatarsal. - Have consulted with podiatry Dr. Lennie who is planning for outpatient follow-up in 1 to 2 weeks for surgical fixation.  On further review her comorbidities and advanced age podiatry reports she may no longer be a surgical candidate - Has been ordered for tall boot cam, NWB with heel contact only for transfers.  Patient is having significant difficulty maneuvering in this boot.  Will likely be mostly wheelchair-bound while she is nonweightbearing status - Home health PT/OT/HHA/RN ordered.  Family also arranging private 24/7 assistance.    COPD Chronic hypoxic respiratory failure - At baseline on 2 to 3 L O2.  Presently appears to be at her respiratory baseline - Some increased work of breathing and desaturation with significant exertion.  Easily resolves to respiratory baseline when at rest  Elevated troponin - Troponin 28.  Downtrending on repeat.  No chest  pain.  Likely demand ischemia - Continue home aspirin  and statin  Hyperlipidemia - Statin  Malignant neoplasm of right upper lung, status postradiation therapy - Continue outpatient follow-up with oncology - Post radiation findings on CT/CXR  Leukocytosis - On arrival WBC 19, stool studies pending to evaluate for new infection.  No pneumonia on CXR.  May be reactive due to dehydration on arrival - Leukocytosis improved without antibiotics.  Mains afebrile  AKI, resolved - Likely secondary to dehydration from diarrhea - Baseline creatinine 0.71, creatinine on arrival 1.67 - AKI improved with IV fluids.  Chronic pain Opioid dependence - Continue home meds  Anxiety Depression - Continue home meds   DVT prophylaxis: SCDs   Code Status: Full Code Disposition: Plan for discharge home tomorrow with home health.  All home health orders have been placed.  Family has been notified.  Consultants:    Procedures:    Antimicrobials:  Anti-infectives (From admission, onward)    None       Data Reviewed: I have personally reviewed following labs and imaging studies CBC: Recent Labs  Lab 12/25/24 2055 12/26/24 0324 12/27/24 0534 12/28/24 0828 12/28/24 1858  12/30/24 0848  WBC 18.6* 15.8* 12.5* 10.4  --  9.7  NEUTROABS 16.0*  --   --  8.0*  --  7.7  HGB 9.7* 9.3* 7.8* 7.1* 7.7* 8.5*  HCT 32.8* 33.3* 26.5* 24.4* 26.2* 28.6*  MCV 90.4 97.1 92.3 93.1  --  92.3  PLT 520* 334 342 305  --  420*   Basic Metabolic Panel: Recent Labs  Lab 12/24/24 0430 12/25/24 2055 12/26/24 0324 12/27/24 0534 12/28/24 0828 12/30/24 0848  NA 140 137 138 143 140 139  K 4.2 3.7 4.0 4.1 4.0 4.2  CL 96* 93* 98 104 102 97*  CO2 37* 31 28 34* 34* 37*  GLUCOSE 85 169* 103* 82 84 93  BUN 18 32* 30* 18 9 9   CREATININE 0.71 1.67* 1.24* 0.75 0.65 0.66  CALCIUM 9.8 9.3 8.8* 8.8* 8.7* 9.3  MG 2.2  --   --  2.1  --   --   PHOS 3.0  --   --  2.7  --   --    GFR: Estimated Creatinine  Clearance: 51.3 mL/min (by C-G formula based on SCr of 0.66 mg/dL). Liver Function Tests: Recent Labs  Lab 12/24/24 0430 12/25/24 2055 12/27/24 0534 12/28/24 0828 12/30/24 0848  AST  --  13* 12* 10* 13*  ALT  --  12 8 7 7   ALKPHOS  --  182* 127* 114 123  BILITOT  --  0.3 0.2 0.3 0.3  PROT  --  7.7 6.2* 6.1* 6.8  ALBUMIN 3.5 3.7 3.0* 3.0* 3.2*   CBG: Recent Labs  Lab 12/24/24 0743  GLUCAP 111*    Recent Results (from the past 240 hours)  Resp panel by RT-PCR (RSV, Flu A&B, Covid) Anterior Nasal Swab     Status: None   Collection Time: 12/21/24  4:10 PM   Specimen: Anterior Nasal Swab  Result Value Ref Range Status   SARS Coronavirus 2 by RT PCR NEGATIVE NEGATIVE Final    Comment: (NOTE) SARS-CoV-2 target nucleic acids are NOT DETECTED.  The SARS-CoV-2 RNA is generally detectable in upper respiratory specimens during the acute phase of infection. The lowest concentration of SARS-CoV-2 viral copies this assay can detect is 138 copies/mL. A negative result does not preclude SARS-Cov-2 infection and should not be used as the sole basis for treatment or other patient management decisions. A negative result may occur with  improper specimen collection/handling, submission of specimen other than nasopharyngeal swab, presence of viral mutation(s) within the areas targeted by this assay, and inadequate number of viral copies(<138 copies/mL). A negative result must be combined with clinical observations, patient history, and epidemiological information. The expected result is Negative.  Fact Sheet for Patients:  bloggercourse.com  Fact Sheet for Healthcare Providers:  seriousbroker.it  This test is no t yet approved or cleared by the [place]  FDA and  has been authorized for detection and/or diagnosis of SARS-CoV-2 by FDA under an Emergency Use Authorization (EUA). This EUA will remain  in effect (meaning this test can  be used) for the duration of the COVID-19 declaration under Section 564(b)(1) of the Act, 21 U.S.C.section 360bbb-3(b)(1), unless the authorization is terminated  or revoked sooner.       Influenza A by PCR NEGATIVE NEGATIVE Final   Influenza B by PCR NEGATIVE NEGATIVE Final    Comment: (NOTE) The Xpert Xpress SARS-CoV-2/FLU/RSV plus assay is intended as an aid in the diagnosis of influenza from Nasopharyngeal swab specimens and should not be used as a  sole basis for treatment. Nasal washings and aspirates are unacceptable for Xpert Xpress SARS-CoV-2/FLU/RSV testing.  Fact Sheet for Patients: bloggercourse.com  Fact Sheet for Healthcare Providers: seriousbroker.it  This test is not yet approved or cleared by the [place]  FDA and has been authorized for detection and/or diagnosis of SARS-CoV-2 by FDA under an Emergency Use Authorization (EUA). This EUA will remain in effect (meaning this test can be used) for the duration of the COVID-19 declaration under Section 564(b)(1) of the Act, 21 U.S.C. section 360bbb-3(b)(1), unless the authorization is terminated or revoked.     Resp Syncytial Virus by PCR NEGATIVE NEGATIVE Final    Comment: (NOTE) Fact Sheet for Patients: bloggercourse.com  Fact Sheet for Healthcare Providers: seriousbroker.it  This test is not yet approved or cleared by the [place]  FDA and has been authorized for detection and/or diagnosis of SARS-CoV-2 by FDA under an Emergency Use Authorization (EUA). This EUA will remain in effect (meaning this test can be used) for the duration of the COVID-19 declaration under Section 564(b)(1) of the Act, 21 U.S.C. section 360bbb-3(b)(1), unless the authorization is terminated or revoked.  Performed at [facility], [address] [place], [place] [address]   Blood culture (routine x 2)     Status:  None   Collection Time: 12/21/24  4:10 PM   Specimen: BLOOD  Result Value Ref Range Status   Specimen Description BLOOD BLOOD RIGHT ARM  Final   Special Requests   Final    BOTTLES DRAWN AEROBIC AND ANAEROBIC Blood Culture adequate volume   Culture   Final    NO GROWTH 5 DAYS Performed at [facility], [address] [place], [place] [address]    Report Status 12/26/2024 FINAL  Final  Blood culture (routine x 2)     Status: None   Collection Time: 12/21/24  4:10 PM   Specimen: BLOOD  Result Value Ref Range Status   Specimen Description BLOOD BLOOD RIGHT ARM  Final   Special Requests   Final    BOTTLES DRAWN AEROBIC AND ANAEROBIC Blood Culture results may not be optimal due to an inadequate volume of blood received in culture bottles   Culture   Final    NO GROWTH 5 DAYS Performed at [facility], [address] [place], [place] [address]    Report Status 12/26/2024 FINAL  Final     Radiology Studies: No results found.   Scheduled Meds:  amitriptyline   50 mg Oral QHS   aspirin   81 mg Oral Daily   budesonide -glycopyrrolate -formoterol   2 puff Inhalation BID   ferrous sulfate   325 mg Oral Q breakfast   gabapentin   1,200 mg Oral TID   latanoprost   1 drop Both Eyes QHS   pantoprazole   40 mg Oral BID   psyllium  1 packet Oral TID   sertraline   25 mg Oral Daily   simvastatin   40 mg Oral QPM   zolpidem   5 mg Oral QHS   Continuous Infusions:     LOS: 2 days  MDM: Patient is high risk for one or more organ failure.  They necessitate ongoing hospitalization for continued IV therapies and subsequent lab monitoring. Total time spent interpreting labs and vitals, reviewing the medical record, coordinating care amongst consultants and care team members, directly assessing and discussing care with the patient and/or family: 55 min  Camerin Ladouceur, DO [facility]  To contact the attending physician between 7A-7P please use Epic Chat. To contact the  covering physician during after hours 7P-7A, please review Amion.  12/30/2024, 12:20 PM   *This document has been created with the assistance of dictation software. Please excuse typographical errors. *   "

## 2024-12-30 NOTE — Plan of Care (Signed)

## 2024-12-31 DIAGNOSIS — I5A Non-ischemic myocardial injury (non-traumatic): Secondary | ICD-10-CM

## 2024-12-31 DIAGNOSIS — S92232D Displaced fracture of intermediate cuneiform of left foot, subsequent encounter for fracture with routine healing: Secondary | ICD-10-CM

## 2024-12-31 DIAGNOSIS — J439 Emphysema, unspecified: Secondary | ICD-10-CM

## 2024-12-31 DIAGNOSIS — D649 Anemia, unspecified: Secondary | ICD-10-CM

## 2024-12-31 MED ORDER — FERROUS SULFATE 325 (65 FE) MG PO TABS: 325.0000 mg | ORAL_TABLET | Freq: Every day | ORAL | 0 refills | Status: AC

## 2024-12-31 MED ORDER — ASPIRIN 81 MG PO CHEW: 81.0000 mg | CHEWABLE_TABLET | Freq: Two times a day (BID) | ORAL | 1 refills | Status: AC

## 2024-12-31 MED ORDER — PSYLLIUM 95 % PO PACK: 1.0000 | PACK | Freq: Two times a day (BID) | ORAL | 0 refills | Status: AC

## 2024-12-31 NOTE — Hospital Course (Signed)
 84yoF with [name], COPD, chronic oxygen dependence on 2 to 3 L O2, depression with anxiety, anemia, right lung cancer s/p radiation, idiopathic neuropathy, opioid dependence, prior perforated colon, who presents with syncope and diarrhea. Patient was hospitalized 1/4 through 1/7 with sepsis secondary to pneumonia and COPD exacerbation.  During the hospital stay states she completed a course of Rocephin  and azithromycin .  She also had diarrhea during that time which was thought to be secondary to antibiotic use. Upon discharge home she reports diarrhea has been persistent 4-5 times a day.  Prior to arrival she had syncopal episode while walking to the bathroom.  She injured her foot in the fall and now has pain in that area. On arrival she was found to have leukocytosis of 18.6, AKI with creatinine of 1.67, BUN 32, mildly tachycardic to 106, vitals otherwise WNL.  She is on her home O2 requirement.  CXR with emphysema and stable right apical pleural-parenchymal opacity.  X-ray of the left foot does show abnormal alignment at the base of the first metatarsal concerning for Lisfranc fracture.  CT confirms multiple fractures.   Stay was then delayed pending discharge planning.  Ultimately she is going to discharge home tomorrow with home health services.  Family is arranging additional assistance in the home

## 2024-12-31 NOTE — Assessment & Plan Note (Addendum)
 Troponin only borderline at 21.  This is not myocardial ischemia.  Was demand ischemia

## 2024-12-31 NOTE — Assessment & Plan Note (Signed)
 Likely secondary to dehydration from diarrhea.  Patient received IV fluids.  Patient was orthostatic initially.

## 2024-12-31 NOTE — Assessment & Plan Note (Signed)
 Resolved.  Can use Imodium .  Psyllium added.

## 2024-12-31 NOTE — Assessment & Plan Note (Signed)
 Resolved.  Creatinine 1.67 on presentation and down to 0.66.

## 2024-12-31 NOTE — TOC Transition Note (Signed)
 Transition of Care [facility]) - Discharge Note   Patient Details  Name: Alexandra [name] MRN: [id number] Date of Birth: 1941/06/14  [facility] CM/SW Contact:  Daved JONETTA Hamilton, RN Phone Number: 12/31/2024, 12:27 PM   Clinical Narrative:     Patient will DC to: Home with [facility] Anticipated DC date: 12/31/2024 Family notified: daughter [name] in room Transport by: [name]  Per MD patient ready for DC to Home.   TOC signing off.   Final next level of care: Home w Home Health Services Barriers to Discharge: Barriers Resolved   Patient Goals and CMS Choice Patient states their goals for this hospitalization and ongoing recovery are:: to go back home with home health CMS Medicare.gov Compare Post Acute Care list provided to:: Patient Choice offered to / list presented to : Patient      Discharge Placement                    Patient and family notified of of transfer: 12/31/24  Discharge Plan and Services Additional resources added to the After Visit Summary for     Discharge Planning Services: CM Consult Post Acute Care Choice: Home Health                    HH Arranged: RN, PT, OT, Nurse's Aide, Social Work [facility] Agency: [facility] Date HH Agency Contacted: 12/31/24 Time HH Agency Contacted: 1227 Representative spoke with at [facility] Agency: [place]  Social Drivers of Health (SDOH) Interventions SDOH Screenings   Food Insecurity: No Food Insecurity (12/26/2024)  Housing: Low Risk (12/26/2024)  Transportation Needs: No Transportation Needs (12/26/2024)  Utilities: Not At Risk (12/26/2024)  Alcohol Screen: Low Risk (03/27/2023)  Depression (PHQ2-9): Low Risk (07/17/2024)  Financial Resource Strain: Low Risk  (02/19/2024)   Received from [facility]  Physical Activity: Inactive (03/27/2023)  Social Connections: Moderately Integrated (12/26/2024)  Recent Concern: Social Connections - Socially Isolated (12/22/2024)  Stress: No Stress Concern  Present (03/27/2023)  Tobacco Use: Medium Risk (12/26/2024)     Readmission Risk Interventions     No data to display

## 2024-12-31 NOTE — Assessment & Plan Note (Signed)
 On 3 L.

## 2024-12-31 NOTE — Assessment & Plan Note (Signed)
 I offered to set her up with outpatient ordering infusions but she declined currently.  Continue ferrous sulfate .  Hemoglobin upon discharge 8.5.

## 2024-12-31 NOTE — Discharge Summary (Signed)
 " Physician Discharge Summary   Patient: Alexandra [name] MRN: [id number] DOB: 03-16-1941  Admit date:     12/25/2024  Discharge date: 12/31/2024  Discharge Physician: Charlie Patterson   PCP: Derick Leita POUR, MD    Follow-up PCP 5 days Follow-up podiatry 1 week  Discharge Diagnoses: Principal Problem:   Syncope Active Problems:   Fall at home, initial encounter   Diarrhea   Chronic obstructive pulmonary disease (COPD) (HCC)   Chronic respiratory failure with hypoxia (HCC)   Acute on chronic anemia   Myocardial injury   Hyperlipidemia   Malignant neoplasm of upper lobe of right lung (HCC)   AKI (acute kidney injury)   Opioid dependence (HCC)   Left foot pain   Anxiety and depression   Displaced fracture of intermediate cuneiform of left foot, subsequent encounter for fracture with routine healing    Hospital Course: 84yoF with [name], COPD, chronic oxygen dependence on 2 to 3 L O2, depression with anxiety, anemia, right lung cancer s/p radiation, idiopathic neuropathy, opioid dependence, prior perforated colon, who presents with syncope and diarrhea. Patient was hospitalized 1/4 through 1/7 with sepsis secondary to pneumonia and COPD exacerbation.  During the hospital stay states she completed a course of Rocephin  and azithromycin .  She also had diarrhea during that time which was thought to be secondary to antibiotic use. Upon discharge home she reports diarrhea has been persistent 4-5 times a day.  Prior to arrival she had syncopal episode while walking to the bathroom.  She injured her foot in the fall and now has pain in that area. On arrival she was found to have leukocytosis of 18.6, AKI with creatinine of 1.67, BUN 32, mildly tachycardic to 106, vitals otherwise WNL.  She is on her home O2 requirement.  CXR with emphysema and stable right apical pleural-parenchymal opacity.  X-ray of the left foot does show abnormal alignment at the base of the first metatarsal concerning for Lisfranc  fracture.  CT confirms multiple fractures.   Stay was then delayed pending discharge planning.  Ultimately she is going to discharge home tomorrow with home health services.  Family is arranging additional assistance in the home  Assessment and Plan: * Syncope Likely secondary to dehydration from diarrhea.  Patient received IV fluids.  Patient was orthostatic initially.  Diarrhea Resolved.  Can use Imodium .  Psyllium added.  Acute on chronic anemia I offered to set her up with outpatient ordering infusions but she declined currently.  Continue ferrous sulfate .  Hemoglobin upon discharge 8.5.  Chronic respiratory failure with hypoxia (HCC) On 3 L  Chronic obstructive pulmonary disease (COPD) (HCC) Respiratory status stable.  On Trelegy.  Myocardial injury Troponin only borderline at 21.  This is not myocardial ischemia.  Was demand ischemia  Malignant neoplasm of upper lobe of right lung (HCC) Continue outpatient follow-up  AKI (acute kidney injury) Resolved.  Creatinine 1.67 on presentation and down to 0.66.  Displaced fracture of intermediate cuneiform of left foot, subsequent encounter for fracture with routine healing Follow-up with podiatry as outpatient.  May end up needing surgical fixation as outpatient.  Patient has a tall cam boot.  Heel contact only for transfers.  Aspirin  for DVT prophylaxis.         Consultants: Podiatry Procedures performed: None Disposition: Home health Diet recommendation:  Regular diet DISCHARGE MEDICATION: Allergies as of 12/31/2024       Reactions   Avelox [moxifloxacin Hcl] Shortness Of Breath, Swelling   Moxifloxacin Anaphylaxis   Sulfa  Antibiotics Anaphylaxis   Cefadroxil Itching   Elemental Sulfur Other (See Comments)   Reaction as a child and not sure what.         Medication List     STOP taking these medications    celecoxib  200 MG capsule Commonly known as: CELEBREX        TAKE these medications    albuterol   108 (90 Base) MCG/ACT inhaler Commonly known as: VENTOLIN  HFA Inhale 1-2 puffs into the lungs every 6 (six) hours as needed for wheezing or shortness of breath.   albuterol  (2.5 MG/3ML) 0.083% nebulizer solution Commonly known as: PROVENTIL  Take 3 mLs (2.5 mg total) by nebulization every 6 (six) hours as needed for Wheezing   amitriptyline  50 MG tablet Commonly known as: ELAVIL  Take 50 mg by mouth at bedtime.   aspirin  81 MG chewable tablet Chew 1 tablet (81 mg total) by mouth 2 (two) times daily. What changed: when to take this   benzonatate 200 MG capsule Commonly known as: TESSALON Take 200 mg by mouth 3 (three) times daily as needed for cough.   cholecalciferol  25 MCG (1000 UNIT) tablet Commonly known as: VITAMIN D3 Take 1,000 Units by mouth daily.   CYCLOBENZAPRINE  HCL PO Take 1 tablet by mouth as needed.   diazepam  5 MG tablet Commonly known as: VALIUM  Take 1 tablet (5 mg) 30-60 minutes prior to Imaging.   ferrous sulfate  325 (65 FE) MG tablet Take 1 tablet (325 mg total) by mouth daily with breakfast.   gabapentin  600 MG tablet Commonly known as: NEURONTIN  Take 1,200 mg by mouth 3 (three) times daily.   HYDROcodone -acetaminophen  10-325 MG tablet Commonly known as: NORCO Take 1 tablet by mouth 4 (four) times daily as needed for moderate pain (pain score 4-6) or severe pain (pain score 7-10).   latanoprost  0.005 % ophthalmic solution Commonly known as: XALATAN  Place 1 drop into both eyes at bedtime.   loperamide  2 MG capsule Commonly known as: IMODIUM  Take 1 capsule (2 mg total) by mouth as needed for diarrhea or loose stools.   OXYGEN Inhale 2 L into the lungs continuous.   pantoprazole  40 MG tablet Commonly known as: PROTONIX  Take 40 mg by mouth 2 (two) times daily.   psyllium 95 % Pack Commonly known as: HYDROCIL/METAMUCIL Take 1 packet by mouth 2 (two) times daily.   sertraline  25 MG tablet Commonly known as: ZOLOFT  Take 25 mg by mouth daily.    simvastatin  40 MG tablet Commonly known as: ZOCOR  Take 40 mg by mouth every evening.   Trelegy Ellipta 200-62.5-25 MCG/ACT Aepb Generic drug: Fluticasone -Umeclidin-Vilant Inhale 1 puff into the lungs daily.   zolpidem  10 MG tablet Commonly known as: AMBIEN  Take 0.5 tablets (5 mg total) by mouth at bedtime.        Follow-up Information     Derick Leita POUR, MD Follow up in 5 day(s).   Specialty: Family Medicine Why: hospital follow up Contact information: [address] [place] [address] [phone]         Lennie Barter, DPM. Schedule an appointment as soon as possible for a visit in 1 week(s).   Specialty: Podiatry Why: Recheck L foot fracture Contact information: [address] [place] [address] [phone]                Discharge Exam: Alexandra [name]   12/25/24 2012  Weight: 59.9 kg   Physical Exam HENT:     Head: Normocephalic.     Mouth/Throat:  Pharynx: No oropharyngeal exudate.  Eyes:     General: Lids are normal.  Cardiovascular:     Rate and Rhythm: Normal rate and regular rhythm.     Heart sounds: Normal heart sounds, S1 normal and S2 normal.  Pulmonary:     Breath sounds: No decreased breath sounds, wheezing, rhonchi or rales.  Abdominal:     Palpations: Abdomen is soft.     Tenderness: There is no abdominal tenderness.  Musculoskeletal:     Right lower leg: No swelling.     Left lower leg: No swelling.  Skin:    General: Skin is warm.  Neurological:     Mental Status: She is alert and oriented to person, place, and time.      Condition at discharge: stable  The results of significant diagnostics from this hospitalization (including imaging, microbiology, ancillary and laboratory) are listed below for reference.   Imaging Studies: CT HEAD WO CONTRAST ( ) Result Date: 12/26/2024 EXAM: CT HEAD WITHOUT CONTRAST 12/26/2024 11:13:00 AM TECHNIQUE: CT of the head was performed without the administration of  intravenous contrast. Automated exposure control, iterative reconstruction, and/or weight based adjustment of the mA/kV was utilized to reduce the radiation dose to as low as reasonably achievable. COMPARISON: 10/27/2023 CLINICAL HISTORY: Syncope/presyncope, cerebrovascular cause suspected. FINDINGS: BRAIN AND VENTRICLES: No acute hemorrhage. No evidence of acute infarct. No hydrocephalus. No extra-axial collection. No mass effect or midline shift. Bilateral basal ganglia mineralization. Stable small pineal cyst. ORBITS: No acute abnormality. SINUSES: Left sphenoid sinus polypoid mucosal thickening. SOFT TISSUES AND SKULL: No acute soft tissue abnormality. No skull fracture. IMPRESSION: 1. No acute intracranial abnormality. Electronically signed by: Franky Stanford MD MD 12/26/2024 08:20 PM EST RP Workstation: [id number]   CT FOOT LEFT WO CONTRAST Result Date: 12/26/2024 CLINICAL DATA:  Pain.  Left foot injury after fall. EXAM: CT OF THE LEFT FOOT WITHOUT CONTRAST TECHNIQUE: Multidetector CT imaging of the left foot was performed according to the standard protocol. Multiplanar CT image reconstructions were also generated. RADIATION DOSE REDUCTION: This exam was performed according to the departmental dose-optimization program which includes automated exposure control, adjustment of the mA and/or kV according to patient size and/or use of iterative reconstruction technique. COMPARISON:  Left foot radiographs dated 12/25/2024. FINDINGS: Bones/Joint/Cartilage Acute fractures at the level of the Lisfranc joint. Acute fracture of the lateral aspect of the medial cuneiform with the 1.2 x 1.2 x 0.5 cm fracture fragment demonstrating 2-3 mm of lateral distraction. Acute fracture of the dorsal medial base of the second metatarsal with a 1.0 x 1.0 x 0.7 cm fracture fragment demonstrating 5-6 mm of dorsal displacement. Mild widening of the Lisfranc joint. No evidence of significant malalignment or dislocation of the TMT joints.  Mildly distracted acute fractures of the dorsolateral aspect of the intermediate and lateral cuneiforms. Small mildly distracted cortical avulsion fracture at the dorsolateral aspect of the cuboid. Acute nondisplaced fracture of the base of the first metatarsal. Mildly distracted fracture at the medial plantar aspect of the fourth metatarsal base. Well corticated ossicle adjacent to the tip of the medial malleolus. Ankle mortise appears congruent. Ligaments Ligaments are suboptimally evaluated by CT. Muscles and Tendons Visualized tendons appear grossly intact. Generalized atrophy of the musculature. Soft tissue edema about the midfoot. Soft tissue Subcutaneous edema of the dorsal midfoot and forefoot. IMPRESSION: 1. Acute fractures at the level of the Lisfranc joint. Acute displaced fracture of the dorsal medial base of the second metatarsal with a 1.0 x 1.0 x 0.7 cm  fracture fragment. Acute mildly distracted fracture of the lateral aspect of the medial cuneiform with 1.2 x 1.2 x 0.5 cm fracture fragment. Mild widening of the Lisfranc joint. No evidence of significant malalignment or dislocation of the TMT joints. 2. Mildly distracted acute fractures of the dorsolateral aspect of the intermediate and lateral cuneiforms. 3. Small mildly distracted acute cortical avulsion fracture of the dorsolateral aspect of the cuboid. 4. Acute nondisplaced fracture of the base of the first metatarsal. 5. Mildly distracted fracture of the medial plantar aspect of the fourth metatarsal base. 6. Soft tissue and subcutaneous edema about the midfoot. Electronically Signed   By: Harrietta Sherry M.D.   On: 12/26/2024 17:27   DG Foot Complete Left Result Date: 12/25/2024 EXAM: 3 OR MORE VIEW(S) XRAY OF THE LEFT FOOT 12/25/2024 11:20:25 PM COMPARISON: None available. CLINICAL HISTORY: trauma FINDINGS: BONES AND JOINTS: Abnormal alignment at the base of the first metatarsal and medial cuneiform concerning for Lisfranc injury. There may  be congenital coalition of the medial and middle cuneiform bones, which could account for this. Possible fracture at the base of the 2nd metatarsal bone. Mild midfoot degenerative changes. Suggest CT or MRI for further evaluation. SOFT TISSUES: Dorsal forefoot subcutaneous soft tissue edema. IMPRESSION: 1. Abnormal alignment at the base of the first metatarsal and medial cuneiform concerning for Lisfranc injury, although congenital coalition of the medial and middle cuneiforms could account for this appearance. Possible fracture at the base of the 2nd metatarsal bone. Suggest CT or MRI for further evaluation. 2. Dorsal forefoot subcutaneous soft tissue edema. Electronically signed by: Elsie Gravely MD 12/25/2024 11:23 PM EST RP Workstation: [id number]   DG Chest Portable 1 View Result Date: 12/25/2024 CLINICAL DATA:  Syncope EXAM: PORTABLE CHEST 1 VIEW COMPARISON:  12/21/2024, chest CT 10/17/2024, chest x-ray 10/27/2023 FINDINGS: Emphysema. No acute airspace disease. Stable right apical opacity since chest CT from October. Stable cardiomediastinal silhouette. Chronic scarring at the right base. IMPRESSION: 1. Emphysema and suspected chronic scarring at the right base. 2. Stable right apical pleuroparenchymal opacity, reference chest CT from October Electronically Signed   By: Luke Bun M.D.   On: 12/25/2024 21:14   US  Venous Img Lower Unilateral Left (DVT) Result Date: 12/22/2024 CLINICAL DATA:  84 year old female with left lower extremity swelling. EXAM: LEFT LOWER EXTREMITY VENOUS DOPPLER ULTRASOUND TECHNIQUE: Gray-scale sonography with graded compression, as well as color Doppler and duplex ultrasound were performed to evaluate the left lower extremity deep venous systems from the level of the common femoral vein and including the common femoral, femoral, profunda femoral, popliteal and calf veins including the posterior tibial, peroneal and gastrocnemius veins when visible. Spectral Doppler was  utilized to evaluate flow at rest and with distal augmentation maneuvers in the common femoral, femoral and popliteal veins. The contralateral common femoral vein was also evaluated for comparison. COMPARISON:  None Available. FINDINGS: LEFT LOWER EXTREMITY Common Femoral Vein: No evidence of thrombus. Normal compressibility, respiratory phasicity and response to augmentation. Central Greater Saphenous Vein: No evidence of thrombus. Normal compressibility and flow on color Doppler imaging. Central Profunda Femoral Vein: No evidence of thrombus. Normal compressibility and flow on color Doppler imaging. Femoral Vein: No evidence of thrombus. Normal compressibility, respiratory phasicity and response to augmentation. Popliteal Vein: No evidence of thrombus. Normal compressibility, respiratory phasicity and response to augmentation. Calf Veins: No evidence of thrombus. Normal compressibility and flow on color Doppler imaging. Other Findings:  None. RIGHT LOWER EXTREMITY Common Femoral Vein: No evidence of thrombus. Normal compressibility,  respiratory phasicity and response to augmentation. IMPRESSION: No evidence of left lower extremity deep venous thrombosis. Ester Sides, MD [facility] Electronically Signed   By: Ester Sides M.D.   On: 12/22/2024 10:59   DG Chest 2 View Result Date: 12/21/2024 EXAM: 2 VIEW(S) XRAY OF THE CHEST 12/21/2024 01:45:00 PM COMPARISON: 10/27/2023 CLINICAL HISTORY: sob FINDINGS: LUNGS AND PLEURA: Biapical scarring. Right apical mass/post-radiation change. Increased bibasilar atelectasis or infiltrates . Trace bilateral pleural effusions. No pneumothorax. HEART AND MEDIASTINUM: No acute abnormality of the cardiac and mediastinal silhouettes. BONES AND SOFT TISSUES: No acute osseous abnormality. IMPRESSION: 1. Increased bibasilar atelectasis or infiltrates with trace bilateral pleural effusions. 2. Right apical mass/post-radiation  change. Electronically signed by: Norman Gatlin MD 12/21/2024 01:58 PM EST RP Workstation: [id number]    Microbiology: Results for orders placed or performed during the hospital encounter of 12/21/24  Resp panel by RT-PCR (RSV, Flu A&B, Covid) Anterior Nasal Swab     Status: None   Collection Time: 12/21/24  4:10 PM   Specimen: Anterior Nasal Swab  Result Value Ref Range Status   SARS Coronavirus 2 by RT PCR NEGATIVE NEGATIVE Final    Comment: (NOTE) SARS-CoV-2 target nucleic acids are NOT DETECTED.  The SARS-CoV-2 RNA is generally detectable in upper respiratory specimens during the acute phase of infection. The lowest concentration of SARS-CoV-2 viral copies this assay can detect is 138 copies/mL. A negative result does not preclude SARS-Cov-2 infection and should not be used as the sole basis for treatment or other patient management decisions. A negative result may occur with  improper specimen collection/handling, submission of specimen other than nasopharyngeal swab, presence of viral mutation(s) within the areas targeted by this assay, and inadequate number of viral copies(<138 copies/mL). A negative result must be combined with clinical observations, patient history, and epidemiological information. The expected result is Negative.  Fact Sheet for Patients:  bloggercourse.com  Fact Sheet for Healthcare Providers:  seriousbroker.it  This test is no t yet approved or cleared by the [place]  FDA and  has been authorized for detection and/or diagnosis of SARS-CoV-2 by FDA under an Emergency Use Authorization (EUA). This EUA will remain  in effect (meaning this test can be used) for the duration of the COVID-19 declaration under Section 564(b)(1) of the Act, 21 U.S.C.section 360bbb-3(b)(1), unless the authorization is terminated  or revoked sooner.       Influenza A by PCR NEGATIVE NEGATIVE Final   Influenza B by PCR  NEGATIVE NEGATIVE Final    Comment: (NOTE) The Xpert Xpress SARS-CoV-2/FLU/RSV plus assay is intended as an aid in the diagnosis of influenza from Nasopharyngeal swab specimens and should not be used as a sole basis for treatment. Nasal washings and aspirates are unacceptable for Xpert Xpress SARS-CoV-2/FLU/RSV testing.  Fact Sheet for Patients: bloggercourse.com  Fact Sheet for Healthcare Providers: seriousbroker.it  This test is not yet approved or cleared by the [place]  FDA and has been authorized for detection and/or diagnosis of SARS-CoV-2 by FDA under an Emergency Use Authorization (EUA). This EUA will remain in effect (meaning this test can be used) for the duration of the COVID-19 declaration under Section 564(b)(1) of the Act, 21 U.S.C. section 360bbb-3(b)(1), unless the authorization is terminated or revoked.     Resp Syncytial Virus by PCR NEGATIVE NEGATIVE Final    Comment: (NOTE) Fact Sheet for Patients: bloggercourse.com  Fact Sheet for Healthcare Providers: seriousbroker.it  This test is not yet approved or cleared by  the [place]  FDA and has been authorized for detection and/or diagnosis of SARS-CoV-2 by FDA under an Emergency Use Authorization (EUA). This EUA will remain in effect (meaning this test can be used) for the duration of the COVID-19 declaration under Section 564(b)(1) of the Act, 21 U.S.C. section 360bbb-3(b)(1), unless the authorization is terminated or revoked.  Performed at [facility], [address] [place], [place] [address]   Blood culture (routine x 2)     Status: None   Collection Time: 12/21/24  4:10 PM   Specimen: BLOOD  Result Value Ref Range Status   Specimen Description BLOOD BLOOD RIGHT ARM  Final   Special Requests   Final    BOTTLES DRAWN AEROBIC AND ANAEROBIC Blood Culture adequate volume   Culture    Final    NO GROWTH 5 DAYS Performed at [facility], [address] [place], [place] [address]    Report Status 12/26/2024 FINAL  Final  Blood culture (routine x 2)     Status: None   Collection Time: 12/21/24  4:10 PM   Specimen: BLOOD  Result Value Ref Range Status   Specimen Description BLOOD BLOOD RIGHT ARM  Final   Special Requests   Final    BOTTLES DRAWN AEROBIC AND ANAEROBIC Blood Culture results may not be optimal due to an inadequate volume of blood received in culture bottles   Culture   Final    NO GROWTH 5 DAYS Performed at [facility], [address] [place], [place] [address]    Report Status 12/26/2024 FINAL  Final    Labs: CBC: Recent Labs  Lab 12/25/24 2055 12/26/24 0324 12/27/24 0534 12/28/24 0828 12/28/24 1858 12/30/24 0848  WBC 18.6* 15.8* 12.5* 10.4  --  9.7  NEUTROABS 16.0*  --   --  8.0*  --  7.7  HGB 9.7* 9.3* 7.8* 7.1* 7.7* 8.5*  HCT 32.8* 33.3* 26.5* 24.4* 26.2* 28.6*  MCV 90.4 97.1 92.3 93.1  --  92.3  PLT 520* 334 342 305  --  420*   Basic Metabolic Panel: Recent Labs  Lab 12/25/24 2055 12/26/24 0324 12/27/24 0534 12/28/24 0828 12/30/24 0848  NA 137 138 143 140 139  K 3.7 4.0 4.1 4.0 4.2  CL 93* 98 104 102 97*  CO2 31 28 34* 34* 37*  GLUCOSE 169* 103* 82 84 93  BUN 32* 30* 18 9 9   CREATININE 1.67* 1.24* 0.75 0.65 0.66  CALCIUM 9.3 8.8* 8.8* 8.7* 9.3  MG  --   --  2.1  --   --   PHOS  --   --  2.7  --   --    Liver Function Tests: Recent Labs  Lab 12/25/24 2055 12/27/24 0534 12/28/24 0828 12/30/24 0848  AST 13* 12* 10* 13*  ALT 12 8 7 7   ALKPHOS 182* 127* 114 123  BILITOT 0.3 0.2 0.3 0.3  PROT 7.7 6.2* 6.1* 6.8  ALBUMIN 3.7 3.0* 3.0* 3.2*   CBG: No results for input(s): GLUCAP in the last 168 hours.  Discharge time spent: greater than 30 minutes.  Signed: Charlie Patterson, MD [facility] 12/31/2024 "

## 2024-12-31 NOTE — Assessment & Plan Note (Signed)
 Follow-up with podiatry as outpatient.  May end up needing surgical fixation as outpatient.  Patient has a tall cam boot.  Heel contact only for transfers.  Aspirin  for DVT prophylaxis.

## 2024-12-31 NOTE — Assessment & Plan Note (Signed)
 Continue outpatient follow up

## 2024-12-31 NOTE — Assessment & Plan Note (Addendum)
 Respiratory status stable.  On Trelegy.

## 2025-04-14 ENCOUNTER — Other Ambulatory Visit

## 2025-04-24 ENCOUNTER — Inpatient Hospital Stay: Admitting: Oncology
